# Patient Record
Sex: Male | Born: 1970 | Race: White | Hispanic: No | Marital: Married | Smoking: Former smoker
Health system: Southern US, Community
[De-identification: ages and names within clinical notes are randomized; demographics above are authoritative.]

## PROBLEM LIST (undated history)

## (undated) DIAGNOSIS — N200 Calculus of kidney: Secondary | ICD-10-CM

## (undated) DIAGNOSIS — M792 Neuralgia and neuritis, unspecified: Secondary | ICD-10-CM

## (undated) DIAGNOSIS — Z5189 Encounter for other specified aftercare: Secondary | ICD-10-CM

## (undated) DIAGNOSIS — Z87442 Personal history of urinary calculi: Secondary | ICD-10-CM

## (undated) DIAGNOSIS — I499 Cardiac arrhythmia, unspecified: Secondary | ICD-10-CM

## (undated) DIAGNOSIS — I10 Essential (primary) hypertension: Secondary | ICD-10-CM

## (undated) DIAGNOSIS — F32A Depression, unspecified: Secondary | ICD-10-CM

## (undated) DIAGNOSIS — G709 Myoneural disorder, unspecified: Secondary | ICD-10-CM

## (undated) DIAGNOSIS — M1990 Unspecified osteoarthritis, unspecified site: Secondary | ICD-10-CM

## (undated) DIAGNOSIS — K219 Gastro-esophageal reflux disease without esophagitis: Secondary | ICD-10-CM

## (undated) DIAGNOSIS — F419 Anxiety disorder, unspecified: Secondary | ICD-10-CM

## (undated) DIAGNOSIS — K432 Incisional hernia without obstruction or gangrene: Secondary | ICD-10-CM

## (undated) HISTORY — DX: Encounter for other specified aftercare: Z51.89

## (undated) HISTORY — PX: APPENDECTOMY: SHX54

## (undated) HISTORY — DX: Unspecified osteoarthritis, unspecified site: M19.90

## (undated) HISTORY — DX: Myoneural disorder, unspecified: G70.9

## (undated) HISTORY — PX: BACK SURGERY: SHX140

## (undated) HISTORY — PX: LAPAROSCOPIC GASTROTOMY W/ REPAIR OF ULCER: SUR772

## (undated) HISTORY — PX: TONSILLECTOMY: SUR1361

## (undated) HISTORY — PX: HERNIA REPAIR: SHX51

## (undated) HISTORY — DX: Neuralgia and neuritis, unspecified: M79.2

---

## 1997-09-14 ENCOUNTER — Emergency Department (HOSPITAL_COMMUNITY): Admission: EM | Admit: 1997-09-14 | Discharge: 1997-09-14 | Payer: Self-pay | Admitting: Emergency Medicine

## 1997-09-21 ENCOUNTER — Emergency Department (HOSPITAL_COMMUNITY): Admission: EM | Admit: 1997-09-21 | Discharge: 1997-09-21 | Payer: Self-pay | Admitting: Emergency Medicine

## 2000-09-23 ENCOUNTER — Emergency Department (HOSPITAL_COMMUNITY): Admission: EM | Admit: 2000-09-23 | Discharge: 2000-09-23 | Payer: Self-pay | Admitting: Emergency Medicine

## 2001-01-11 ENCOUNTER — Encounter (INDEPENDENT_AMBULATORY_CARE_PROVIDER_SITE_OTHER): Payer: Self-pay | Admitting: *Deleted

## 2001-01-11 ENCOUNTER — Emergency Department (HOSPITAL_COMMUNITY): Admission: EM | Admit: 2001-01-11 | Discharge: 2001-01-11 | Payer: Self-pay | Admitting: *Deleted

## 2001-01-12 ENCOUNTER — Emergency Department (HOSPITAL_COMMUNITY): Admission: EM | Admit: 2001-01-12 | Discharge: 2001-01-12 | Payer: Self-pay | Admitting: Emergency Medicine

## 2002-11-04 ENCOUNTER — Encounter: Admission: RE | Admit: 2002-11-04 | Discharge: 2002-11-04 | Payer: Self-pay | Admitting: Family Medicine

## 2002-11-04 ENCOUNTER — Encounter: Payer: Self-pay | Admitting: Family Medicine

## 2004-06-14 ENCOUNTER — Encounter: Admission: RE | Admit: 2004-06-14 | Discharge: 2004-06-14 | Payer: Self-pay | Admitting: Family Medicine

## 2004-10-22 ENCOUNTER — Emergency Department (HOSPITAL_COMMUNITY): Admission: EM | Admit: 2004-10-22 | Discharge: 2004-10-22 | Payer: Self-pay | Admitting: Emergency Medicine

## 2005-08-04 ENCOUNTER — Emergency Department (HOSPITAL_COMMUNITY): Admission: EM | Admit: 2005-08-04 | Discharge: 2005-08-04 | Payer: Self-pay | Admitting: Emergency Medicine

## 2005-08-30 ENCOUNTER — Ambulatory Visit (HOSPITAL_COMMUNITY): Admission: RE | Admit: 2005-08-30 | Discharge: 2005-08-30 | Payer: Self-pay | Admitting: Specialist

## 2005-10-19 ENCOUNTER — Encounter: Admission: RE | Admit: 2005-10-19 | Discharge: 2005-10-19 | Payer: Self-pay | Admitting: Specialist

## 2006-02-17 ENCOUNTER — Emergency Department (HOSPITAL_COMMUNITY): Admission: EM | Admit: 2006-02-17 | Discharge: 2006-02-17 | Payer: Self-pay | Admitting: Emergency Medicine

## 2006-09-12 ENCOUNTER — Ambulatory Visit (HOSPITAL_COMMUNITY)
Admission: RE | Admit: 2006-09-12 | Discharge: 2006-09-12 | Payer: Self-pay | Admitting: Physical Medicine and Rehabilitation

## 2006-11-26 ENCOUNTER — Emergency Department (HOSPITAL_COMMUNITY): Admission: EM | Admit: 2006-11-26 | Discharge: 2006-11-26 | Payer: Self-pay | Admitting: Emergency Medicine

## 2006-12-11 ENCOUNTER — Encounter: Admission: RE | Admit: 2006-12-11 | Discharge: 2006-12-11 | Payer: Self-pay | Admitting: Specialist

## 2007-01-05 ENCOUNTER — Encounter: Admission: RE | Admit: 2007-01-05 | Discharge: 2007-01-05 | Payer: Self-pay | Admitting: Family Medicine

## 2007-01-26 ENCOUNTER — Encounter: Admission: RE | Admit: 2007-01-26 | Discharge: 2007-01-26 | Payer: Self-pay | Admitting: Neurosurgery

## 2007-01-30 ENCOUNTER — Ambulatory Visit: Payer: Self-pay | Admitting: Thoracic Surgery

## 2007-02-06 ENCOUNTER — Inpatient Hospital Stay (HOSPITAL_COMMUNITY): Admission: RE | Admit: 2007-02-06 | Discharge: 2007-02-09 | Payer: Self-pay | Admitting: Neurosurgery

## 2007-02-06 ENCOUNTER — Ambulatory Visit: Payer: Self-pay | Admitting: Thoracic Surgery

## 2007-02-06 HISTORY — PX: OTHER SURGICAL HISTORY: SHX169

## 2007-02-21 ENCOUNTER — Ambulatory Visit: Payer: Self-pay | Admitting: Thoracic Surgery

## 2007-02-21 ENCOUNTER — Encounter: Admission: RE | Admit: 2007-02-21 | Discharge: 2007-02-21 | Payer: Self-pay | Admitting: Thoracic Surgery

## 2007-04-25 ENCOUNTER — Encounter: Admission: RE | Admit: 2007-04-25 | Discharge: 2007-04-25 | Payer: Self-pay | Admitting: Thoracic Surgery

## 2007-04-25 ENCOUNTER — Ambulatory Visit: Payer: Self-pay | Admitting: Thoracic Surgery

## 2007-06-17 ENCOUNTER — Encounter: Admission: RE | Admit: 2007-06-17 | Discharge: 2007-06-17 | Payer: Self-pay | Admitting: Neurosurgery

## 2007-08-30 ENCOUNTER — Encounter: Admission: RE | Admit: 2007-08-30 | Discharge: 2007-08-30 | Payer: Self-pay | Admitting: Neurosurgery

## 2007-09-02 ENCOUNTER — Ambulatory Visit (HOSPITAL_COMMUNITY): Admission: RE | Admit: 2007-09-02 | Discharge: 2007-09-02 | Payer: Self-pay | Admitting: *Deleted

## 2008-02-01 ENCOUNTER — Emergency Department (HOSPITAL_COMMUNITY): Admission: EM | Admit: 2008-02-01 | Discharge: 2008-02-01 | Payer: Self-pay | Admitting: Emergency Medicine

## 2008-02-01 ENCOUNTER — Ambulatory Visit: Payer: Self-pay | Admitting: Psychiatry

## 2008-02-01 ENCOUNTER — Inpatient Hospital Stay (HOSPITAL_COMMUNITY): Admission: RE | Admit: 2008-02-01 | Discharge: 2008-02-04 | Payer: Self-pay | Admitting: Psychiatry

## 2008-08-05 ENCOUNTER — Ambulatory Visit: Payer: Self-pay | Admitting: Neurosurgery

## 2009-03-30 ENCOUNTER — Emergency Department (HOSPITAL_COMMUNITY): Admission: EM | Admit: 2009-03-30 | Discharge: 2009-03-30 | Payer: Self-pay | Admitting: Emergency Medicine

## 2009-04-30 ENCOUNTER — Ambulatory Visit: Payer: Self-pay | Admitting: Family Medicine

## 2009-04-30 DIAGNOSIS — N508 Other specified disorders of male genital organs: Secondary | ICD-10-CM

## 2009-05-08 ENCOUNTER — Telehealth: Payer: Self-pay | Admitting: Family Medicine

## 2009-05-13 ENCOUNTER — Encounter: Payer: Self-pay | Admitting: Family Medicine

## 2009-05-29 ENCOUNTER — Ambulatory Visit (HOSPITAL_COMMUNITY): Admission: RE | Admit: 2009-05-29 | Discharge: 2009-05-29 | Payer: Self-pay | Admitting: Urology

## 2009-05-29 ENCOUNTER — Emergency Department (HOSPITAL_COMMUNITY): Admission: EM | Admit: 2009-05-29 | Discharge: 2009-05-29 | Payer: Self-pay | Admitting: Emergency Medicine

## 2009-07-15 ENCOUNTER — Telehealth: Payer: Self-pay | Admitting: Family Medicine

## 2009-07-20 ENCOUNTER — Telehealth: Payer: Self-pay | Admitting: Family Medicine

## 2009-07-30 ENCOUNTER — Telehealth: Payer: Self-pay | Admitting: Family Medicine

## 2009-07-31 ENCOUNTER — Telehealth: Payer: Self-pay | Admitting: Family Medicine

## 2009-08-06 DIAGNOSIS — M545 Low back pain, unspecified: Secondary | ICD-10-CM

## 2009-08-06 HISTORY — DX: Low back pain, unspecified: M54.50

## 2009-09-14 ENCOUNTER — Ambulatory Visit (HOSPITAL_COMMUNITY): Admission: RE | Admit: 2009-09-14 | Discharge: 2009-09-14 | Payer: Self-pay

## 2010-05-06 ENCOUNTER — Ambulatory Visit: Payer: Self-pay | Admitting: Family Medicine

## 2010-05-06 ENCOUNTER — Encounter: Payer: Self-pay | Admitting: Family Medicine

## 2010-05-06 ENCOUNTER — Emergency Department (HOSPITAL_COMMUNITY)
Admission: EM | Admit: 2010-05-06 | Discharge: 2010-05-06 | Payer: Self-pay | Source: Home / Self Care | Admitting: Emergency Medicine

## 2010-05-06 DIAGNOSIS — R079 Chest pain, unspecified: Secondary | ICD-10-CM

## 2010-05-06 HISTORY — DX: Chest pain, unspecified: R07.9

## 2010-05-13 ENCOUNTER — Ambulatory Visit: Payer: Self-pay | Admitting: Internal Medicine

## 2010-05-13 DIAGNOSIS — R05 Cough: Secondary | ICD-10-CM

## 2010-06-12 ENCOUNTER — Encounter: Payer: Self-pay | Admitting: Family Medicine

## 2010-06-14 ENCOUNTER — Encounter: Payer: Self-pay | Admitting: Orthopaedic Surgery

## 2010-06-22 NOTE — Progress Notes (Signed)
Summary: referral  Phone Note Call from Patient   Summary of Call: patient is calling because he does not want to go back to dr Dutch Quint because he "messed up " his back.  any suggestions.  there was a dr's name suggested from [facility] at the last  office visit.  he does not remember his name. Initial call taken by: Kern Reap CMA Duncan Dull),  July 31, 2009 12:58 PM  Follow-up for Phone Call        the best way to proceed is to have [name], refer him to one of the neurosurgeons at [facility] Follow-up by: Roderick Pee MD,  July 31, 2009 2:05 PM  Additional Follow-up for Phone Call Additional follow up Details #1::        tried to call patient home number not correct Additional Follow-up by: Kern Reap CMA Duncan Dull),  August 03, 2009 2:20 PM     Appended Document: Orders Update    Clinical Lists Changes  Problems: Added new problem of BACK PAIN, LUMBAR (ICD-724.2) Orders: Added new Referral order of Neurosurgeon Referral (Neurosurgeon) - Signed

## 2010-06-22 NOTE — Progress Notes (Signed)
Summary: ref  Phone Note Call from Patient   Caller: Patient Call For: Roderick Pee MD Summary of Call: he's been to urologist and now needs to pusue back issues- Kendell Bane? needs referral cell- [phone]. Initial call taken by: Raechel Ache, RN,  July 15, 2009 4:09 PM  Follow-up for Phone Call        Fleet Contras please call to clarify what the issue is >????????? Follow-up by: Roderick Pee MD,  July 20, 2009 8:14 AM  Additional Follow-up for Phone Call Additional follow up Details #1::        left message on machine for patient to return our call Additional Follow-up by: Kern Reap CMA Duncan Dull),  July 20, 2009 10:53 AM

## 2010-06-22 NOTE — Progress Notes (Signed)
Summary: return call  Phone Note Call from Patient   Caller: Patient Call For: rachel Summary of Call: returned your call same phone # Initial call taken by: Raechel Ache, RN,  July 20, 2009 2:41 PM  Follow-up for Phone Call        left message on machine for patient to return call Follow-up by: Kern Reap CMA Duncan Dull),  July 22, 2009 1:04 PM  Additional Follow-up for Phone Call Additional follow up Details #1::        uable to reach patient left message on machine  Additional Follow-up by: Kern Reap CMA Duncan Dull),  July 23, 2009 9:49 AM

## 2010-06-22 NOTE — Progress Notes (Signed)
Summary: referral  Phone Note Call from Patient   Caller: Patient Call For: Kevin Pee MD Summary of Call: calling about referral to [facility] for his back. This was discussed at his visit- he has chronic back pain since his surgery which shoots through to his right ribs.  Please refer Initial call taken by: Raechel Ache, RN,  July 30, 2009 3:30 PM  Follow-up for Phone Call        this would be done through [name] his neurosurgeon.......... have him call [name] Follow-up by: Kevin Pee MD,  July 30, 2009 4:02 PM  Additional Follow-up for Phone Call Additional follow up Details #1::        patient's father is aware Additional Follow-up by: Kern Reap CMA Duncan Dull),  July 31, 2009 12:44 PM

## 2010-06-24 NOTE — Assessment & Plan Note (Signed)
Summary: left side pain and dizziness//slm   Vital Signs:  Patient profile:   40 year old male Height:      71 inches Weight:      181 pounds BMI:     25.34 Temp:     98.6 degrees F oral Pulse rate:   97 / minute BP sitting:   142 / 92  (left arm) Cuff size:   regular  Vitals Entered By: Kathlene November LPN (May 06, 2010 4:36 PM) CC: last night woke up at 3am with sweats, this morning had dizziness and pain in center of chest that radiates into right side and back- worse when takes a deep breath   CC:  last night woke up at 3am with sweats and this morning had dizziness and pain in center of chest that radiates into right side and back- worse when takes a deep breath.  History of Present Illness: Kevin Jackson is a 40 year old male, who comes in today for evaluation of two problems.  He states he woke up around two o'clock in the morning, was dizzy, and sweating.  It lasted for a couple hours and went away.  He also noticed some discomfort in the right side of his chest.  This se gone away.  He has a fever, earache, sore throat, cough, etc..  It also for the past 3 or 4, months.  He's had episodes that now are becoming more frequent of discomfort in his upper chest where he feels like he can't breathe.  He had surgery on his back by [name] for thoracic bone problem....... question bone spur........ and he went back to see [name] with the above concerns.  [name] told him it is not related to the surgery  Current Medications (verified): 1)  None  Allergies (verified): No Known Drug Allergies  Comments:  Nurse/Medical Assistant: The patient's medications and allergies were reviewed with the patient and were updated in the Medication and Allergy Lists. Kathlene November LPN (May 06, 2010 4:38 PM)  Past History:  Past medical, surgical, family and social histories (including risk factors) reviewed, and no changes noted (except as noted below).  Past Medical  History: Reviewed history from 04/30/2009 and no changes required. back surgery nerve damage with ribs  Past Surgical History: Reviewed history from 04/30/2009 and no changes required. Appendectomy Tonsillectomy back surgery  Family History: Reviewed history from 04/30/2009 and no changes required. Father: unknown Mother: HTN Siblings: 2 brothers - 1 tumours               1 sister - healthy Children: 1 boy - healthy                1 girl scoliosis  Social History: Reviewed history from 04/30/2009 and no changes required. Divorced Never Smoked Drug use-no  Review of Systems      See HPI  Physical Exam  General:  Well-developed,well-nourished,in no acute distress; alert,appropriate and cooperative throughout examination Head:  Normocephalic and atraumatic without obvious abnormalities. No apparent alopecia or balding. Eyes:  No corneal or conjunctival inflammation noted. EOMI. Perrla. Funduscopic exam benign, without hemorrhages, exudates or papilledema. Vision grossly normal. Ears:  External ear exam shows no significant lesions or deformities.  Otoscopic examination reveals clear canals, tympanic membranes are intact bilaterally without bulging, retraction, inflammation or discharge. Hearing is grossly normal bilaterally. Nose:  External nasal examination shows no deformity or inflammation. Nasal mucosa are pink and moist without lesions or exudates. Mouth:  Oral mucosa and oropharynx  without lesions or exudates.  Teeth in good repair. Neck:  No deformities, masses, or tenderness noted. Chest Wall:  No deformities, masses, tenderness or gynecomastia noted. Breasts:  No masses or gynecomastia noted Lungs:  Normal respiratory effort, chest expands symmetrically. Lungs are clear to auscultation, no crackles or wheezes. Heart:  Normal rate and regular rhythm. S1 and S2 normal without gallop, murmur, click, rub or other extra sounds. Skin:  scar right posterior chest wall from  previous thoracic surgery   Problems:  Medical Problems Added: 1)  Dx of Chest Pain  (ICD-786.50)  Impression & Recommendations:  Problem # 1:  CHEST PAIN (ICD-786.50) Assessment New  Orders: Pulmonary Referral (Pulmonary)  Patient Instructions: 1)  take Motrin, 600 mg 3 times a day with food. 2)  I will schedule a consult and pulmonary ASAP to begin an evaluation of your problems   Orders Added: 1)  Pulmonary Referral [Pulmonary] 2)  Est. Patient Level IV [[address]]

## 2010-06-24 NOTE — Assessment & Plan Note (Signed)
Summary: Pulmonary consultation   Visit Type:  Initial Consult Copy to:  Dr. Kelle Darting Primary Provider/Referring Provider:  Dr. Kelle Darting  CC:  Dyspnea and CP.  History of Present Illness: 40 yowm minimal smoking quit 1999 referred by Dr Tawanna Cooler for chest pain  May 13, 2010  1st pulmonary office eval for cp dates back to surgery on T -7 by Dr Dutch Quint always with deep breath or if pushes under egde  of R costal margin in ax line assoc with sensation on numbness along same basic dermatome to the midline,  also dry  coughing ever since surgery, steroids seemed to improve the cough but not the pain.  Pt denies any significant sore throat, dysphagia, itching, sneezing,  nasal congestion or excess secretions,  fever, chills, sweats, unintended wt loss, pleuritic or exertional cp, hempoptysis, change in activity tolerance  orthopnea pnd or leg swelling.  Pt also denies any obvious fluctuation in symptoms with weather or environmental change or other alleviating or aggravating factors.       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  Quit in 1999.  Smoked 1/2 ppd x 2 yrs Drug use-no ETOH once per month Uses smokeless tobacco Lives with Parents  Review of Systems       The patient complains of shortness of breath with activity, non-productive cough, chest pain, irregular heartbeats, and abdominal pain.  The patient denies shortness of  breath at rest, productive cough, coughing up blood, acid heartburn, indigestion, loss of appetite, weight change, difficulty swallowing, sore throat, tooth/dental problems, headaches, nasal congestion/difficulty breathing through nose, sneezing, itching, ear ache, anxiety, depression, hand/feet swelling, joint stiffness or pain, rash, change in color of mucus, and fever.    Vital Signs:  Patient profile:   40 year old male Height:      70 inches Weight:      183 pounds O2 Sat:      97 % on Room air Temp:     98.2 degrees F oral Pulse rate:   93 / minute BP sitting:   112 / 86  (left arm)  Vitals Entered By: Vernie Murders (May 13, 2010 8:47 AM)  O2 Flow:  Room air  Physical Exam  Additional Exam:  wt 181 > 183 May 13, 2010 Ambulatory healthy appearing anxious wm nad HEENT: nl dentition, turbinates, and orophanx. Nl external ear canals without cough reflex Neck without JVD/Nodes/TM Lungs clear to A and P bilaterally without cough on insp or exp maneuvers RRR no s3 or murmur or increase in P2 Abd soft and benign with nl excursion in the supine position. 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  try rx with zostix, consider tens as last resort.  Orders: Consultation Level III ([address])  Problem # 2:  COUGH (ICD-786.2)  The most common causes of chronic cough in immunocompetent adults include: upper airway cough syndrome (UACS), previously referred to as postnasal drip syndrome,  caused by variety of rhinosinus conditions; (2) asthma; (3) GERD; (4) chronic bronchitis from cigarette smoking or other inhaled environmental irritants; (5) nonasthmatic eosinophilic bronchitis; and (6) bronchiectasis. These conditions, singly or in combination, have accounted  for up to 94% of the causes of chronic cough in prospective studies.    Of the three most common causes of chronic cough, only one(GERD)  can actually cause the other two(asthma/pnds)  and perpetuate the cylce of cough inducing airway trauma, inflammation, heightened sensitivity to reflux which is prompted by the cough itself via a cyclical mechanism.  This may partially respond to steroids and look like asthma and post nasal drainage but never erradicated completely unless the cough and the secondary reflux are eliminated, preferably both at the same time.   Orders: Consultation Level III ([address])  Medications Added to Medication List This Visit: 1)  Tramadol Hcl 50 Mg Tabs (Tramadol hcl) .... One to two by mouth every 4-6 hours if coughing at all 2)  Prednisone 10 Mg Tabs (Prednisone) .... 4 each am x 2days, 2x2days, 1x2days and stop 3)  Prilosec Otc 20 Mg Tbec (Omeprazole magnesium) .... Take  one 30-60 min before first meal of the day 4)  Pepcid 20 Mg Tabs (Famotidine) .... Take one by mouth at bedtime  Patient Instructions: 1)  Zostrix cream 4 x  a day for 4 weeks 2)  Tramadol 50 1-2 every 4 hours until no longer coughing at all 3)  Prednisone 10 mg 4 each am x 2days, 2x2days, 1x2days and stop 4)  Prilosec before bfast and pepcid 20 mg at bedtime as long as coughing ( reflux is to cough what oxygen is to fire)  5)  GERD (REFLUX)  is a common cause of respiratory symptoms. It commonly presents without heartburn and can be treated with medication, but also with lifestyle changes including avoidance of late meals, excessive alcohol, smoking cessation, and avoid fatty foods, chocolate, peppermint, colas, red wine, and acidic juices such as orange juice. NO MINT OR MENTHOL PRODUCTS SO NO COUGH DROPS  6)  USE SUGARLESS CANDY INSTEAD (jolley ranchers)  7)  NO OIL BASED VITAMINS  8)  Call in 2 weeks if not satisfied  Prescriptions: PREDNISONE 10 MG  TABS (PREDNISONE) 4 each am x 2days, 2x2days,  1x2days and stop  #14 x 0   Entered and Authorized by:   Nyoka Cowden MD   Signed by:   Nyoka Cowden MD on 05/13/2010   Method used:   Print then Give to Patient   RxID:   [id number] TRAMADOL HCL 50 MG  TABS (TRAMADOL HCL) One to two by mouth every 4-6 hours if coughing at all  #40 x 0   Entered and Authorized by:   Nyoka Cowden MD   Signed by:   Nyoka Cowden MD on 05/13/2010   Method used:   Print then Give to Patient   RxID:   [id number]

## 2010-07-07 ENCOUNTER — Emergency Department (HOSPITAL_COMMUNITY)
Admission: EM | Admit: 2010-07-07 | Discharge: 2010-07-07 | Disposition: A | Discharge status: Home / Self Care | Payer: Self-pay | Attending: Emergency Medicine | Admitting: Emergency Medicine

## 2010-07-07 ENCOUNTER — Emergency Department (HOSPITAL_COMMUNITY): Payer: Self-pay

## 2010-07-07 DIAGNOSIS — R079 Chest pain, unspecified: Secondary | ICD-10-CM | POA: Insufficient documentation

## 2010-07-07 DIAGNOSIS — R002 Palpitations: Secondary | ICD-10-CM | POA: Insufficient documentation

## 2010-07-07 LAB — POCT I-STAT, CHEM 8
Calcium, Ion: 1.1 mmol/L — ABNORMAL LOW (ref 1.12–1.32)
Glucose, Bld: 104 mg/dL — ABNORMAL HIGH (ref 70–99)
HCT: 44 % (ref 39.0–52.0)
Hemoglobin: 15 g/dL (ref 13.0–17.0)
TCO2: 25 mmol/L (ref 0–100)

## 2010-07-07 LAB — POCT CARDIAC MARKERS: Troponin i, poc: <0.05 ng/mL (ref 0.00–0.09)

## 2010-07-21 ENCOUNTER — Encounter: Payer: Self-pay | Admitting: Cardiovascular Disease
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
## 2010-10-05 NOTE — Letter (Signed)
April 25, 2007   Henry A. Pool, M.D.  [address] [place], [place] [address]   Re:  Kevin Jackson, Kevin Jackson              DOB:  February 17, 1971   Dear Mardelle Matte,   I saw Kevin Jackson back in the office today.  He is doing well  overall.  From the standpoint of his chest x-ray, he is stable.  He is  still having a moderate amount of pain, particularly in the right upper  quadrant.  I told him this should gradually improve and for him to  gradually increase his activities.   His blood pressure was 125/80, pulse 87, respirations 18, sats 98%.   He will see you back again on December 30th, and I will be happy to see  him again if he has any future problems.   Ines Bloomer, M.D.  Electronically Signed   DPB/MEDQ  D:  04/25/2007  T:  04/25/2007  Job:  [id number]

## 2010-10-05 NOTE — Op Note (Signed)
NAME:  Kevin Jackson, Kevin Jackson      ACCOUNT NO.:  [id number]   MEDICAL RECORD NO.:  [id number]          PATIENT TYPE:  INP   LOCATION:  3311                         FACILITY:  [facility]   PHYSICIAN:  Kathaleen Maser. Pool, M.D.    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   PROCEDURE:  Right transthoracic T7-8 microdiskectomy, microdissection.   SURGEON:  Kathaleen Maser. Pool, M.D.   ASSISTANT:  Reinaldo Meeker, M.D.   THORACIC SURGEON:  Ines Bloomer, M.D.   ANESTHESIA:  General endotracheal.   INDICATIONS FOR PROCEDURE:  Mr. Belloso is a 40 year old male with  history of the mid-thoracic pain with radiation to both lower  extremities.  Workup has demonstrated evidence of a focal disk  herniation at T7-8 with significant compression of the ventral aspect of  the spinal cord.  We discussed options of management including both  operative and nonoperative care.  We discussed in detail the possibility  of undergoing a right-sided T7-8 transthoracic microdiskectomy in hopes  of improving his symptoms.  The patient is aware of the risks and  benefits and wishes to proceed.   DESCRIPTION OF PROCEDURE:  The patient was taken to the operating room  and placed on the operating table in the supine position.  After an  adequate level  of anesthesia achieved, the patient positioned in the  left lateral decubitus position with all pressure points padded and  axillary roll placed.  The patient's right chest and abdomen was prepped  and draped sterilely.   Dr. Edwyna Shell performed a thoracotomy through what was felt to be the sixth  interspace.  This was carried down into the pleural cavity.  The lung  was deflated and packed away.  After Dr. Edwyna Shell  had provided exposure,  an x-ray was taken and approach was actually made to the T8-9  interspace.  Dissection was redirected one level cephalad.  I then  removed the rib head of the eighth rib at its articulation with the  lateral aspect of the vertebral body of T8 and the aspect of the disk  space.  A diskectomy was then performed involving the posterior half of  the disk space.  Microscope was brought into the field.  Using the high-  speed drill, aspects of the posterior bodies of T7-T8 were removed.  Dissection then proceeded down to the level of the posterior  longitudinal ligament.  The posterior longitudinal ligament was then  elevated and resected in piecemeal fashion using Kerrison rongeurs.  The  __________ of the thecal sac was then identified.  Decompression of the  central spinal canal was then performed using Kerrison rongeurs to  undercut the bodies at T7-T8.  Decompression then proceeded from right  total left, crossing past midline.  A focal fragmented disk herniation  was encountered and completely resected, which was obviously compressing  the spinal cord.  After very thorough decompression was achieved, there  was no evidence of any continued compression.  The spinal canal was  inspected.  There was no evidence of any residual compression.  There  was no evidence of injury to the thecal sac or nerve roots or spinal  cord.  The wound was then irrigated with  antibiotic solution.  Gelfoam was placed topically in the disk space for  hemostasis which was found to be good.  Dr. Edwyna Shell returned and closed  the thoracotomy and placed chest tubes in typical fashion.  There no  operative complications.  The patient tolerated the procedure well, and  he returned to the recovery room postoperatively.           ______________________________  Kathaleen Maser Pool, M.D.     HAP/MEDQ  D:  02/06/2007  T:  02/06/2007  Job:  [id number]

## 2010-10-05 NOTE — Op Note (Signed)
[name], Kevin Jackson      ACCOUNT NO.:  [id number]   MEDICAL RECORD NO.:  [id number]          PATIENT TYPE:  INP   LOCATION:  3172                         FACILITY:  [facility]   PHYSICIAN:  Ines Bloomer, M.D. DATE OF BIRTH:  Jul 26, 1970   DATE OF PROCEDURE:  DATE OF DISCHARGE:                               OPERATIVE REPORT   PREOPERATIVE DIAGNOSIS:  Herniated C7-T8 disk.   POSTOPERATIVE DIAGNOSIS:  Herniated C7-T8 disk.   OPERATION:  Right thoracotomy for exposure for the C7-T8 diskectomy.   SURGEON:  Ines Bloomer, M.D.   ASSISTANT:  Stephanie Acre Dominick, P.A.-C.   DESCRIPTION OF PROCEDURE:  After percutaneous insertion of all  monitoring lines,  The right lung was deflated and a posterolateral thoracotomy incision  was made over the seventh intercostal space and dissection was carried  down through the subcutaneous tissues partially dividing the latissimus  and splitting the serratus.  Seventh intercostal space was then entered  and dissection was carried down to the ninth rib.  This was then marked  and with an x-ray and apparently we were actually in the eighth  intercostal space so we dissected up superiorly into the seventh  intercostal space.  A TPA  was placed in the lung and then the inferior  pulmonary ligament had been taken down.  The pleura was opened over the  head of the eighth rib and Dr. Lelon Perla, in a separate dictation, did  a diskectomy.  After this had been done, 2 chest tubes were placed  through separate stab wounds, a straight 28 and a right angled 28.  A  Marcaine block was done in the usual fashion.  A single On-Q was  inserted in the usual fashion.  The chest was closed with 3 pericostals,  #1 Vicryl in the muscle layer, 2-0 Vicryl in subcutaneous tissues,  Dermabond over the skin.  The patient was returned to the recovery room  in stable condition.      Ines Bloomer, M.D.  Electronically Signed     DPB/MEDQ  D:  02/06/2007   T:  02/06/2007  Job:  [id number]   cc:   Ines Bloomer, M.D.

## 2010-10-05 NOTE — Letter (Signed)
February 21, 2007   Henry A. Pool, MD  [address]  [place], [place]  [address]   Re:  [name], [name]              DOB:  08/28/1970   Dear Mardelle Matte,   I saw Kevin Jackson in the office today and he is doing well after his  surgery.  His incision is well healed and his chest x-ray shows normal  postoperative changes.  He is still having some moderate post-  thoracotomy pain but overall looks better than I would have expected.  His lungs were clear to auscultation and percussion.  His blood pressure  was 124/79, pulse 82, respirations 18.  Sats were 95%.   I will see him back again in 2 months with a chest x-ray.   Ines Bloomer, M.D.  Electronically Signed   DPB/MEDQ  D:  02/21/2007  T:  02/22/2007  Job:  [id number]

## 2010-10-05 NOTE — Letter (Signed)
January 30, 2007   Sherilyn Cooter A. Pool, M.D.  [address]  [place], [place] [address]   Re:  Kevin Jackson, Kevin Jackson              DOB:  [birth year]   Dear Dr. Jordan Likes:   I appreciate the opportunity to see this patient.  This 40 year old  patient has had a history of severe pain in his right chest and  underwent a myelogram which showed a T7, a central disk perfusion with  no compression.  He continues to have severe pain as well as pain in his  right leg and was seen by Dr. Otelia Sergeant and then Dr. Jordan Likes and is referred  here for a diskectomy which is scheduled on 02/06/2007.   His medications include:  Cymbalta, Norco, Lyrica and Celebrex.  He has  no allergies.   PAST MEDICAL HISTORY:  He has had a tonsillectomy and appendectomy.   FAMILY HISTORY:  Noncontributory.   SOCIAL HISTORY:  He is married, he has two children.  He works as a Furniture conservator/restorer.  He quit smoking two years ago.  He does not drink alcohol  on a regular basis.   REVIEW OF SYSTEMS:  VITAL SIGNS: His weight is 165 pounds, he is 5 feet  11 inches.  He has had some weight loss.  CARDIAC: No angina or atrial fibrillation.  PULMONARY:  No bronchitis, hemoptysis, wheezing.  GI:  No nausea, vomiting, constipation or diarrhea.  No dysuria or  frequent urination.  VASCULAR:  He has pain in his leg with walking.  No TIA's or stroke.  NEUROLOGIC:  No dizziness, blackouts or seizures.  MUSCULOSKELETAL: See history of present illness.  PSYCHIATRIC:  No psychiatric.  HEENT: No changes in eye sight or hearing.  HEME:  No problems with bleeding or clotting disorders.   PHYSICAL EXAMINATION:  General:  He is a well developed Caucasian male  in no acute distress.  Vital Signs:  Blood pressure 136/85, pulse 18,  pulse 84, respirations 18, saturations 98%.  Head, eyes, ears, nose and  throat are unremarkable.  Neck is supple without thyromegaly.  There is  no supraclavicular or axillary adenopathy.  Chest is clear to  auscultation and percussion.  Heart is regular sinus rhythm.  No murmur.  Abdomen is soft, no hepatosplenomegaly.  Pulses 2+, there is no clubbing  or edema.   I will be happy to assist on a T7, 8 diskectomy on Kevin Jackson and we  will plan to use a right thoracotomy.  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   I appreciate the opportunity to see him.   Sincerely,   Ines Bloomer, M.D.  Electronically Signed   DPB/MEDQ  D:  01/30/2007  T:  01/31/2007  Job:  [id number]

## 2010-10-08 NOTE — H&P (Signed)
[place]  Patient:    Kevin Jackson, BULKLEY Visit Number: [id number] MRN: [id number]          Service Type: EMS Location: [facility] Attending Physician:  Corlis Leak. Dictated by:   Genene Churn. Sherin Quarry, M.D. Adm. Date:  01/11/2001 Disc. Date: 01/11/2001   CC:         Verlin Grills, M.D.  Desma Maxim, M.D.  [facility]   History and Physical  EMERGENCY ROOM VISIT  HISTORY OF PRESENT ILLNESS:  Mr. Kraemer is a 40 year old white male who vomited blood the night before last and spent yesterday in the emergency room where evaluations included a normal CBC and a CMET.  He was endoscoped by Dr. Laural Benes and aphthous ulceration was found in the stomach.  He was started on Protonix and the stomach biopsies were obtained, although they are still pending.  He apparently was quite sedated yesterday, went home, did not eat supper, spent the night feeling okay, and awakened this morning feeling relatively well, although still having some burning in his stomach.  He then drove to his wifes place of work to retrieve his wallet, and on the way into her office felt quite dizzy, light-headed, but did not pass out and did not throw up.  He actually did not feel nauseated with that.  His wife called me and we agreed to see him in the emergency room to be sure that he was not bleeding.  Ever since the night before last he had had no nausea, no vomiting, and no bowel movements.  PAST HISTORY:  Patient does have a past history of alcohol ingestion but stated that he was not drinking very heavily or regularly prior to this onset of illness and had not had any alcohol since he was discharged from the emergency room yesterday.  He does not smoke and he does not take nonsteroidal drugs.  MEDICATIONS:  Except for the Protonix started yesterday, he is not on any medications.  ALLERGIES:  He is not allergic to any  medicine.  PHYSICAL EXAMINATION:  VITAL SIGNS:  blood pressure 130/80, pulse 75, and there was no tilt.  HEENT:  Negative.  NECK:  Supple without nodes, bruits, or thyromegaly.  CHEST:  Clear.  Heart tones normal without extra sounds or murmurs.  ABDOMEN:  Scaphoid and nontender without organomegaly or masses palpable.  LABORATORY DATA:  CBC reveals a hemoglobin of 15.9 - up from 14.4 yesterday, WBC 5.5 which was unchanged.  I did not repeat his CMET, which had been normal from yesterday.  I also did not repeat his lipase, which was normal.  IMPRESSION:  I believe that he has, probably, either alcoholic or viral gastroenteritis with no evidence currently of active GI bleeding.  RECOMMENDATION:  I told him that he needs to go home, not to drive today, not to go to work, and that he is to eat small amounts more often and wait for the Protonix kick in.  I asked him to make an appointment to see Dr. Laural Benes early next week and encouraged him to see Dr. Arvilla Market should he have any other additional problems. Dictated by:   Genene Churn. Sherin Quarry, M.D. Attending Physician:  Corlis Leak DD:  01/12/01 TD:  01/13/01 Job: [id number]

## 2010-10-08 NOTE — Discharge Summary (Signed)
NAME:  Kevin Jackson, Kevin Jackson      ACCOUNT NO.:  [id number]   MEDICAL RECORD NO.:  [id number]          PATIENT TYPE:  INP   LOCATION:  5151                         FACILITY:  [facility]   PHYSICIAN:  Kathaleen Maser. Pool, M.D.    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   OPERATION THE TREATMENTS.:  Right T7-T8 transthoracic microdiskectomy.   HISTORY OF PRESENT ILLNESS:  Kevin Jackson is a 40 year old male with  history of intractable back and bilateral lower extremity pain, failing  all conservative measures. Workup demonstrates evidence of a moderately  large central disk herniation at T7-8 with marked compression of the  spinal cord.  The patient was counseled as to his options.  He decided  to proceed with a right-sided transthoracic microdiskectomy to help  improve symptoms.   HOSPITAL COURSE:  The patient was taken to the operating room where  uncomplicated right-sided thoracotomy and transthoracic microdiskectomy  was performed.  Postoperatively, the patient did well.  He had the  expected amount of postoperative discomfort from his thoracotomy.  He  was mobilized rapidly.  His back and lower extremity symptoms were  completely resolved.  Neurological exam was intact.  The patient was  rapidly mobilize.  Chest tubes were discontinued on his first and second  postoperative days.  Pain control was obtained with oral medications.  The patient was tolerating a regular diet and felt good enough to be  discharged home.   CONDITION ON DISCHARGE:  Improved.   DISCHARGE DISPOSITION:  The patient will follow up with both me and Dr.  Edwyna Shell in 1 week.           ______________________________  Kathaleen Maser. Pool, M.D.     HAP/MEDQ  D:  03/06/2007  T:  03/06/2007  Job:  [id number]

## 2010-10-08 NOTE — Discharge Summary (Signed)
NAME:  Kevin Jackson, Kevin Jackson      ACCOUNT NO.:  [id number]   MEDICAL RECORD NO.:  [id number]          PATIENT TYPE:  IPS   LOCATION:  0402                          FACILITY:  [facility]   PHYSICIAN:  Anselm Jungling, MD  DATE OF BIRTH:  28-May-1970   DATE OF ADMISSION:  02/01/2008  DATE OF DISCHARGE:  02/04/2008                               DISCHARGE SUMMARY   IDENTIFYING DATA AND REASON FOR ADMISSION:  The patient is a 40 year old  married male who had recently lost his job.  He presented requesting  help for depression, after making a suicide gesture by overdose.  Also,  he had a history of back surgery, and in the course of his subsequent  pain management, he felt he had become addicted to oxycodone.  He wanted  help with this as well.  Please refer to the admission note for further  details pertaining to the symptoms, circumstances and history that led  to his hospitalization.  He was given an initial Axis I diagnosis of  major depressive episode, opiate dependence, and substance-induced mood  disorder.   MEDICAL AND LABORATORY:  The patient was medically and physically  assessed by the psychiatric nurse practitioner.  He was generally in  good health.  He had had thoracic spine surgery, and still experienced  significant pain from this.  This was addressed with Ultram 50 mg up to  3 times daily as needed for pain, and Lidoderm 5% dermal patch.  The  patient was started on a clonidine protocol for opiate withdrawal.   He presented as a well-nourished, normally-developed male who was fairly  open and a good historian.  There were no signs or symptoms of psychosis  or thought disorder.  He denied any further suicidal ideation and  regretted his previous overdose gesture.  He verbalized a strong desire  for help.  He gave [place] consent to contact his wife for her involvement in  his treatment.   On the second hospital day, he was pleasant, relaxed, and participating  in the milieu.  He  remained committed to staying off opiate analgesics.  He indicated that he had been in contact with his wife and that she was  glad that he was getting help from [place].  Later that day, the wife came in  for a family session.  She indicated that she was extremely worried  about her husband, not only for the reasons described above, but because  he had been threatening and aggressive at home.  She indicated that her  preference was that he not come home for the time-being.  She felt some  concern for the safety of herself and the children.   The patient accepted this relatively well.  He indicated that he would  honor his wife's wishes, and accordingly, go to stay with his parents  for the time-being.   The patient, shortly thereafter requested discharge, even though he had  not completed the clonidine protocol.  We recommended that he stay  another 1-2 days to complete the protocol, and to allow [place] to make  appropriate referrals to chemical dependency treatment aftercare, but he  indicated that  he was in a hurry to go.  He felt that he needed to get  back to the business of trying to find a new job.  He indicated that he  felt strong resolve to not return to opiate analgesic use.   AFTERCARE:  The patient was referred to [facility] of the [place],  date and time to be determined at the time of this dictation.  The  patient was discharged with a regimen of clonidine 0.1 mg t.i.d. the day  following discharge, with a taper over the next 4 days.  He was also  prescribed Ultram 50 mg up to b.i.d. as needed for pain, and Lidoderm 5%  patch daily.   DISCHARGE DIAGNOSES:  AXIS I:  1. Opiate dependence, early remission.  2. Substance-induced mood disorder.  3. Marital problem.  AXIS II:  Deferred.  AXIS III:  Back pain.  AXIS IV:  Stressors severe.  AXIS V: GAF on discharge 55.      Anselm Jungling, MD  Electronically Signed     SPB/MEDQ  D:  02/07/2008  T:  02/10/2008  Job:   [id number]

## 2010-10-08 NOTE — Op Note (Signed)
[place]  Patient:    Kevin Jackson, Kevin Jackson Visit Number: [id number] MRN: [id number]          Service Type: Attending:  Verlin Grills, M.D. Proc. Date: 01/11/01   CC:         Desma Maxim, M.D.   Operative Report  DATE OF BIRTH:  [birth year]  PROCEDURE PERFORMED:  Esophagogastroduodenoscopy.  ENDOSCOPIST:  Verlin Grills, M.D.  REFERRING PHYSICIAN:  Desma Maxim, M.D.  INDICATIONS FOR PROCEDURE:  The patient is a 40 year old male who presented to the [facility] Emergency Department with a 24-hour history of nausea, vomiting and small volume hematemesis.  PREMEDICATION:  Versed 10 mg, fentanyl 50 mcg.  INSTRUMENT USED:  Olympus gastroscope.  DESCRIPTION OF PROCEDURE:  After obtaining informed consent, Kevin Jackson was placed in the left lateral decubitus position.  I administered intravenous Versed and intravenous fentanyl to achieve conscious sedation for the procedure.  The patients blood pressure, oxygen saturations and cardiac rhythm were monitored throughout the procedure and documented in the medical record.  The Olympus gastroscope was passed through the hypopharynx into the proximal esophagus without difficulty.  The hypopharynx, larynx and vocal cords appeared normal.  Esophagoscopy:  The proximal, mid and lower segments of the esophagus appeared normal.  Gastroscopy:  Retroflex view of the gastric cardia and fundus followed by examination of the gastric body and antrum revealed generalized aphthous type ulcers involving the entire gastric mucosa.  The pylorus appeared normal. These lesions were biopsied for pathological evaluation.  Duodenoscopy:  There are a few small aphthous type ulcers in the duodenal bulb but the descending duodenum appeared normal.  ASSESSMENT:  Generalized erosive gastritis, characterized by scattered aphthous type ulcers.  Biopsies are  pending.  Differential diagnosis would include Crohns disease or viral lesions.  This does not appear typical of peptic ulcer disease.  The patient tells me he takes no nonsteroidal anti-inflammatory drugs or other medications on a chronic basis.  PLAN:  Kevin Jackson can be discharged from the emergency room.  I will place him on a proton pump inhibitor (Protonix).  I will see him back in my office to go over these biopsies.  If there is any indication that he might have Crohns gastroenteritis, I will schedule him for a small bowel follow-through x-ray series. Attending:  Verlin Grills, M.D. DD:  01/11/01 TD:  01/11/01 Job: [id number]

## 2010-12-16 ENCOUNTER — Encounter: Payer: Self-pay | Admitting: Family Medicine

## 2010-12-17 ENCOUNTER — Ambulatory Visit: Payer: Self-pay | Admitting: Family Medicine

## 2010-12-20 ENCOUNTER — Encounter: Payer: Self-pay | Admitting: Family Medicine

## 2010-12-20 ENCOUNTER — Ambulatory Visit (INDEPENDENT_AMBULATORY_CARE_PROVIDER_SITE_OTHER): Payer: Self-pay | Admitting: Family Medicine

## 2010-12-20 VITALS — BP 130/90 | Temp 99.0°F | Wt 178.0 lb

## 2010-12-20 DIAGNOSIS — M546 Pain in thoracic spine: Secondary | ICD-10-CM

## 2010-12-20 MED ORDER — GABAPENTIN (ONCE-DAILY) 300 MG PO TABS: 1.0000 | ORAL_TABLET | Freq: Three times a day (TID) | ORAL | Status: DC

## 2010-12-20 NOTE — Patient Instructions (Signed)
Start gabapentin one at night for 3 days then increase to one twice a day.  After one more week increase to one three times daily as needed.

## 2010-12-20 NOTE — Progress Notes (Signed)
  Subjective:    Patient ID: Kevin Jackson, male    DOB: 1970/06/05, 40 y.o.   MRN: [id number]  HPI Patient has history of chronic thoracic back pain. About 4 years ago reportedly had T7 disc surgery. Since then intermittent pain mostly to the right of thoracic spine around T6-T7 level. He has seen multiple specialists and has had epidurals and TENS units previously. Poor relief with opioids. At one point was taking Lyrica which did seem to help some. He also has some neuropathic type pains right chest wall related to this surgery. The pain is sharp and off and on but relatively continuous over the past 4 years.  Lost insurance and fell away from care of specialists.  Dark spot which is scaly and comes and goes right side of nose past 6-8 months. No pruritis and no pain. No history of skin cancer  Patient relates 2-3 week history of intermittent symptoms of losing his balance to the left side. No focal weakness. Denies any vertigo. No headaches. Denies dysarthria or dysphagia. He's had some occasional tremors upper extremities but nonfocal. No focal weakness. No visual changes.  Review of Systems  Constitutional: Negative for fever, activity change, appetite change, fatigue and unexpected weight change.  Eyes: Negative for visual disturbance.  Respiratory: Negative for shortness of breath.   Genitourinary: Negative for dysuria and hematuria.  Musculoskeletal: Positive for back pain. Negative for myalgias.  Neurological: Positive for dizziness. Negative for seizures, syncope, weakness and headaches.  Hematological: Negative for adenopathy. Does not bruise/bleed easily.       Objective:   Physical Exam  Constitutional: He is oriented to person, place, and time. He appears well-developed and well-nourished. No distress.  Cardiovascular: Normal rate, regular rhythm and normal heart sounds.   No murmur heard. Pulmonary/Chest: Effort normal and breath sounds normal. No respiratory  distress. He has no wheezes. He has no rales.       Patient has scar right thoracic back region. No spinal tenderness. Minimal parathoracic muscular tenderness. No visible swelling  Musculoskeletal: He exhibits no edema.  Neurological: He is alert and oriented to person, place, and time. No cranial nerve deficit.       Cerebellar normal by finger to nose and gait normal.  Skin:       No definitive skin lesion on the face. He has areas minimal hyperpigmentation which is light brown right side of nose but no nodular changes and no telangiectasias or ulceration.  Psychiatric: He has a normal mood and affect. His behavior is normal. Thought content normal.          Assessment & Plan:  #1 chronic thoracic back pain following surgery 4 years ago for T7 disc. Suspect good amount of this may be more neuropathic. Start gabapentin 300 mg at night and after 3 days titrate to twice a day. After one week of twice a day if pain not well controlled titrate 3 times a day. Followup with primary in one month.  Recommend that he re-establish with orthopedist. #2 intermittent scaly lesion right side of nose -question seborrheic keratosis. Consider light liquid nitrogen if persists #3 question ataxia by history intermittently past few weeks. ?postviral Nonfocal exam his time. No headaches. If symptoms persist MRI of brain to further assess

## 2011-01-20 ENCOUNTER — Ambulatory Visit (INDEPENDENT_AMBULATORY_CARE_PROVIDER_SITE_OTHER): Payer: Self-pay | Admitting: Family Medicine

## 2011-01-20 ENCOUNTER — Encounter: Payer: Self-pay | Admitting: Family Medicine

## 2011-01-20 VITALS — BP 110/80 | Temp 98.9°F | Wt 182.0 lb

## 2011-01-20 DIAGNOSIS — M545 Low back pain, unspecified: Secondary | ICD-10-CM

## 2011-01-20 MED ORDER — GABAPENTIN (ONCE-DAILY) 300 MG PO TABS: 1.0000 | ORAL_TABLET | Freq: Three times a day (TID) | ORAL | Status: DC

## 2011-01-20 NOTE — Progress Notes (Signed)
  Subjective:    Patient ID: Kevin Jackson, male    DOB: 18-Dec-1970, 40 y.o.   MRN: [id number]  HPI [name] is a 40 year old male, who comes in today for follow-up of back pain.  In September 2008 he had a T7 decompression by [name].  No history of trauma.  Subsequently, he developed severe back pain.  They tried many different medications.  Nothing seemed to help.  A month ago.  He came in saw [name],,,,,,,,, to start him on Neurontin 300 mg t.i.d. And within 3 days.  His back pain was gone.  He says this is the best is felt in years.   Review of Systems General and neurologic review of systems otherwise negative    Objective:   Physical Exam  Well-developed well-nourished man in acute distress.  Examination of back shows a scar from previous surgery.  Otherwise, normal     Assessment & Plan:  Neuropathic back pain.  Plan continue Neurontin 300 t.i.d.

## 2011-01-20 NOTE — Patient Instructions (Signed)
Continue the Neurontin 300 mg 3 times daily.  Return in one year or sooner if any problems

## 2011-01-31 ENCOUNTER — Ambulatory Visit (INDEPENDENT_AMBULATORY_CARE_PROVIDER_SITE_OTHER): Payer: Self-pay | Admitting: Family Medicine

## 2011-01-31 ENCOUNTER — Encounter: Payer: Self-pay | Admitting: Family Medicine

## 2011-01-31 DIAGNOSIS — B369 Superficial mycosis, unspecified: Secondary | ICD-10-CM

## 2011-01-31 DIAGNOSIS — M545 Low back pain: Secondary | ICD-10-CM

## 2011-01-31 MED ORDER — TRAMADOL HCL 50 MG PO TABS: 50.0000 mg | ORAL_TABLET | Freq: Four times a day (QID) | ORAL | Status: DC | PRN

## 2011-01-31 MED ORDER — CYCLOBENZAPRINE HCL 5 MG PO TABS: 5.0000 mg | ORAL_TABLET | Freq: Three times a day (TID) | ORAL | Status: DC | PRN

## 2011-01-31 NOTE — Patient Instructions (Signed)
Applying over-the-counter anti-fungal cream 3 times daily until the rash is 100% gone.  Flexor and terminal,,,,,,,,,,, one half or one tablet of each 3 times a day as needed for severe pain.  [facility] and see the orthopedist ASAP

## 2011-01-31 NOTE — Progress Notes (Signed)
  Subjective:    Patient ID: Kevin Jackson, male    DOB: 11-22-1970, 40 y.o.   MRN: [id number]  HPI [name] is a 40 year old male, single, nonsmoker, who comes in today for evaluation of two problems.  He states that about 3 days ago he noticed some rash on his upper lip and now is involving the right and left upper face.  These concerned it may be an allergic reaction to the Neurontin however, he does not have a systemic reaction.  Its local confined to the face.  Also 3 days ago he began having severe back pain radiating down to his right heel.  He describes the pain as constant and achy type pain.  A7 on a scale of one to 10 again radiating to his right heel.  If he bends his leg.  The pain diminishes, but does not go away.  He had a similar episode like this in 2010 with a greenish orthopedics.  Subsequently had epidural steroid injections, which resolved the pain.   Review of Systems General dermatologic no muscular review of systems otherwise negative   Objective:   Physical Exam  Well-developed well-nourished man in no acute distress.  Neurologic examination lower extremities.  Normal examination of face shows a rash consistent with a fungal dermatitis      Assessment & Plan:  Fungal dermatitis,,,,,,,, OTC medication.  Lumbar disk disease with acute pain right down to right heel.  Plan Flexeril tramadol,.........Marland KitchenOrthopedic consult ASAP

## 2011-02-22 ENCOUNTER — Emergency Department (HOSPITAL_COMMUNITY)
Admission: EM | Admit: 2011-02-22 | Discharge: 2011-02-22 | Disposition: A | Discharge status: Home / Self Care | Payer: Self-pay | Attending: Emergency Medicine | Admitting: Emergency Medicine

## 2011-02-22 ENCOUNTER — Emergency Department (HOSPITAL_COMMUNITY): Payer: Self-pay

## 2011-02-22 ENCOUNTER — Telehealth: Payer: Self-pay | Admitting: *Deleted

## 2011-02-22 DIAGNOSIS — IMO0002 Reserved for concepts with insufficient information to code with codable children: Secondary | ICD-10-CM | POA: Insufficient documentation

## 2011-02-22 DIAGNOSIS — M79609 Pain in unspecified limb: Secondary | ICD-10-CM | POA: Insufficient documentation

## 2011-02-22 DIAGNOSIS — R29898 Other symptoms and signs involving the musculoskeletal system: Secondary | ICD-10-CM | POA: Insufficient documentation

## 2011-02-22 DIAGNOSIS — M533 Sacrococcygeal disorders, not elsewhere classified: Secondary | ICD-10-CM | POA: Insufficient documentation

## 2011-02-22 NOTE — Telephone Encounter (Signed)
Left message on machine for patient  To schedule appointment

## 2011-02-22 NOTE — Telephone Encounter (Signed)
Please schedule an office visit Thursday

## 2011-02-22 NOTE — Telephone Encounter (Signed)
Pt is still complaining of back pain, and has to either see a specialist or go to the ER for a "pinched nerve.".  Is asking Dr. Nelida Meuse advice since he does not have insurance to cover multiple visits.

## 2011-02-22 NOTE — Telephone Encounter (Signed)
Pt is calling back.  Call sent to [facility].

## 2011-02-23 LAB — ACETAMINOPHEN LEVEL: Acetaminophen (Tylenol), Serum: 98.4 — ABNORMAL HIGH

## 2011-02-23 LAB — DIFFERENTIAL
Lymphocytes Relative: 21
Lymphs Abs: 1.1
Neutrophils Relative %: 69

## 2011-02-23 LAB — URINALYSIS, ROUTINE W REFLEX MICROSCOPIC
Bilirubin Urine: NEGATIVE
Glucose, UA: NEGATIVE
Nitrite: NEGATIVE
Specific Gravity, Urine: 1.018
pH: 6

## 2011-02-23 LAB — CBC
Hemoglobin: 14.6
MCHC: 35
MCV: 87
RBC: 4.79

## 2011-02-23 LAB — COMPREHENSIVE METABOLIC PANEL
CO2: 27
Calcium: 9.5
Creatinine, Ser: 0.68
GFR calc Af Amer: >60
GFR calc non Af Amer: >60
Glucose, Bld: 112 — ABNORMAL HIGH

## 2011-02-23 LAB — RAPID URINE DRUG SCREEN, HOSP PERFORMED
Cocaine: NOT DETECTED
Opiates: POSITIVE — AB
Tetrahydrocannabinol: NOT DETECTED

## 2011-02-23 LAB — PROTIME-INR
INR: 1.1
Prothrombin Time: 14.7

## 2011-02-23 LAB — SALICYLATE LEVEL: Salicylate Lvl: <4

## 2011-03-02 ENCOUNTER — Other Ambulatory Visit (HOSPITAL_COMMUNITY): Payer: Self-pay | Admitting: Emergency Medicine

## 2011-03-02 DIAGNOSIS — M549 Dorsalgia, unspecified: Secondary | ICD-10-CM

## 2011-03-03 LAB — URINALYSIS, ROUTINE W REFLEX MICROSCOPIC
Hgb urine dipstick: NEGATIVE
Specific Gravity, Urine: 1.027
Urobilinogen, UA: 1
pH: 6

## 2011-03-03 LAB — CBC
HCT: 36.2 — ABNORMAL LOW
Hemoglobin: 12.9 — ABNORMAL LOW
MCHC: 35.3
MCHC: 35.6
MCV: 88.8
RDW: 12.5
RDW: 12.6

## 2011-03-03 LAB — PROTIME-INR: INR: 1

## 2011-03-03 LAB — BASIC METABOLIC PANEL
CO2: 28
Chloride: 104
Glucose, Bld: 117 — ABNORMAL HIGH
Potassium: 3.8
Sodium: 137

## 2011-03-03 LAB — COMPREHENSIVE METABOLIC PANEL
ALT: 21
AST: 33
Calcium: 8.4
Creatinine, Ser: 0.83
GFR calc Af Amer: >60
Sodium: 138
Total Protein: 5.6 — ABNORMAL LOW

## 2011-03-04 LAB — BASIC METABOLIC PANEL
BUN: 8
CO2: 29
Chloride: 107
Creatinine, Ser: 1.08
GFR calc Af Amer: >60
Potassium: 3.4 — ABNORMAL LOW

## 2011-03-04 LAB — TYPE AND SCREEN: Antibody Screen: NEGATIVE

## 2011-03-04 LAB — DIFFERENTIAL
Basophils Relative: 1
Eosinophils Absolute: 0.1
Eosinophils Relative: 2
Lymphs Abs: 1.2
Monocytes Relative: 15 — ABNORMAL HIGH
Neutrophils Relative %: 54

## 2011-03-04 LAB — CBC
HCT: 41
MCHC: 35.8
MCV: 88
RBC: 4.65

## 2011-03-07 ENCOUNTER — Ambulatory Visit (HOSPITAL_COMMUNITY)
Admission: RE | Admit: 2011-03-07 | Discharge: 2011-03-07 | Disposition: A | Discharge status: Home / Self Care | Payer: Self-pay | Source: Ambulatory Visit | Attending: Emergency Medicine | Admitting: Emergency Medicine

## 2011-03-07 DIAGNOSIS — M549 Dorsalgia, unspecified: Secondary | ICD-10-CM

## 2011-03-07 DIAGNOSIS — M545 Low back pain, unspecified: Secondary | ICD-10-CM | POA: Insufficient documentation

## 2011-03-07 DIAGNOSIS — M79609 Pain in unspecified limb: Secondary | ICD-10-CM | POA: Insufficient documentation

## 2011-03-07 DIAGNOSIS — M5137 Other intervertebral disc degeneration, lumbosacral region: Secondary | ICD-10-CM | POA: Insufficient documentation

## 2011-03-07 DIAGNOSIS — M129 Arthropathy, unspecified: Secondary | ICD-10-CM | POA: Insufficient documentation

## 2011-03-07 DIAGNOSIS — M51379 Other intervertebral disc degeneration, lumbosacral region without mention of lumbar back pain or lower extremity pain: Secondary | ICD-10-CM | POA: Insufficient documentation

## 2011-03-08 ENCOUNTER — Telehealth: Payer: Self-pay | Admitting: Family Medicine

## 2011-03-08 LAB — COMPREHENSIVE METABOLIC PANEL
Albumin: 4.5
Alkaline Phosphatase: 60
BUN: 5 — ABNORMAL LOW
CO2: 27
Chloride: 104
Creatinine, Ser: 1.04
GFR calc non Af Amer: >60
Glucose, Bld: 133 — ABNORMAL HIGH
Potassium: 3.2 — ABNORMAL LOW
Total Bilirubin: 1

## 2011-03-08 LAB — DIFFERENTIAL
Basophils Absolute: 0
Basophils Relative: 0
Lymphocytes Relative: 15
Monocytes Absolute: 0.4
Neutro Abs: 6.7

## 2011-03-08 LAB — CBC
HCT: 46.9
Hemoglobin: 16.5
MCV: 89.1
RBC: 5.26
WBC: 8.4

## 2011-03-08 LAB — RAPID URINE DRUG SCREEN, HOSP PERFORMED
Barbiturates: NOT DETECTED
Benzodiazepines: NOT DETECTED
Cocaine: NOT DETECTED

## 2011-03-08 LAB — ETHANOL: Alcohol, Ethyl (B): 177 — ABNORMAL HIGH

## 2011-03-08 NOTE — Telephone Encounter (Signed)
Requesting mri results from yesterday. Thanks.

## 2011-03-09 ENCOUNTER — Telehealth: Payer: Self-pay | Admitting: Family Medicine

## 2011-03-09 DIAGNOSIS — M549 Dorsalgia, unspecified: Secondary | ICD-10-CM

## 2011-03-09 NOTE — Telephone Encounter (Signed)
Pt requesting results of MRI. Please contact

## 2011-03-09 NOTE — Telephone Encounter (Signed)
Results to Dr Tawanna Cooler to review.  Results please of MRI.

## 2011-03-09 NOTE — Telephone Encounter (Signed)
Patient went to ER and they told them they would send

## 2011-03-10 NOTE — Telephone Encounter (Signed)
Kevin Jackson has ongoing back pain.  He went to the emergency room and they ordered an MRI.  The next step is to have him see a neurosurgeon for further evaluation.  Please set up a neurosurgical consult

## 2011-03-14 ENCOUNTER — Other Ambulatory Visit: Payer: Self-pay | Admitting: Family Medicine

## 2011-03-17 ENCOUNTER — Ambulatory Visit (INDEPENDENT_AMBULATORY_CARE_PROVIDER_SITE_OTHER): Payer: Self-pay | Admitting: Family Medicine

## 2011-03-17 ENCOUNTER — Encounter: Payer: Self-pay | Admitting: Family Medicine

## 2011-03-17 VITALS — BP 140/100 | Temp 98.7°F | Wt 178.0 lb

## 2011-03-17 DIAGNOSIS — I10 Essential (primary) hypertension: Secondary | ICD-10-CM | POA: Insufficient documentation

## 2011-03-17 MED ORDER — LISINOPRIL 10 MG PO TABS: 10.0000 mg | ORAL_TABLET | Freq: Every day | ORAL | Status: DC

## 2011-03-17 NOTE — Progress Notes (Signed)
  Subjective:    Patient ID: Kevin Jackson, male    DOB: 1971-01-27, 40 y.o.   MRN: [id number]  HPI Kevin Jackson is a 40 year old male, who comes in today for evaluation of two problems.  Recently he's felt flushed and rapid heart rate in the evening and begin monitoring.  His blood pressure at home, and it's been running 140 to 160 systolic, and the diastolic has been in the 9o   Both his mother, father, and sister have hypertension   Review of Systems General and cardiovascular review of systems otherwise negative except he went to the emergency room last week for evaluation of back pain.  They ordered an MRI.  We have referred him to neurosurgery.  They are currently reviewing his studies and will see him ASAP   Objective:   Physical Exam  Well-developed well-nourished man in no acute distress.  Heart lungs, normal BP 140/100 right arm sitting position      Assessment & Plan:  New-onset hypertension.........Marland Kitchen Zestril 10 mg daily follow-up in two weeks BP check.  Q.a.m..... He was cautioned about the side effects, which can be hives.  Airway edema and cough

## 2011-03-17 NOTE — Patient Instructions (Signed)
Begin lisinopril 10 mg one tab daily.  Check your blood pressure daily in the morning.  Return in two weeks for follow-up

## 2011-03-31 ENCOUNTER — Ambulatory Visit (INDEPENDENT_AMBULATORY_CARE_PROVIDER_SITE_OTHER): Payer: Self-pay | Admitting: Family Medicine

## 2011-03-31 ENCOUNTER — Encounter: Payer: Self-pay | Admitting: Family Medicine

## 2011-03-31 VITALS — BP 120/80 | HR 100 | Temp 99.0°F | Wt 178.0 lb

## 2011-03-31 DIAGNOSIS — I10 Essential (primary) hypertension: Secondary | ICD-10-CM

## 2011-03-31 MED ORDER — LISINOPRIL 10 MG PO TABS: 10.0000 mg | ORAL_TABLET | Freq: Every day | ORAL | Status: DC

## 2011-03-31 NOTE — Progress Notes (Signed)
  Subjective:    Patient ID: Kevin Jackson, male    DOB: Jul 02, 1970, 40 y.o.   MRN: [id number]  HPI Jarod is a 40 year old male, who comes in today for follow-up of hypertension.  We started him on lisinopril 10 mg a day.  BP now is 120/80.  No side effects from medication.   Review of Systems    General and cardiovascular review systems otherwise negative Objective:   Physical Exam  Well-developed well-nourished man in no acute distress.  BP right arm sitting position 120/80      Assessment & Plan:  Hypertension adult continue lisinopril 10 mg daily BP check weekly return one year or sooner if any problems

## 2011-03-31 NOTE — Patient Instructions (Signed)
Continued to take her medication, one tablet daily.  Check a blood pressure in the morning, once a week.  Return in one year, sooner for any problems

## 2011-04-04 ENCOUNTER — Other Ambulatory Visit: Payer: Self-pay | Admitting: Internal Medicine

## 2011-04-07 ENCOUNTER — Other Ambulatory Visit (HOSPITAL_COMMUNITY): Payer: Self-pay | Admitting: Neurosurgery

## 2011-04-07 DIAGNOSIS — M542 Cervicalgia: Secondary | ICD-10-CM

## 2011-04-07 DIAGNOSIS — M549 Dorsalgia, unspecified: Secondary | ICD-10-CM

## 2011-04-12 ENCOUNTER — Ambulatory Visit (HOSPITAL_COMMUNITY)
Admission: RE | Admit: 2011-04-12 | Discharge: 2011-04-12 | Disposition: A | Discharge status: Home / Self Care | Payer: Self-pay | Source: Ambulatory Visit | Attending: Neurosurgery | Admitting: Neurosurgery

## 2011-04-12 ENCOUNTER — Ambulatory Visit (HOSPITAL_COMMUNITY): Admission: RE | Admit: 2011-04-12 | Payer: Self-pay | Source: Ambulatory Visit

## 2011-04-12 DIAGNOSIS — M549 Dorsalgia, unspecified: Secondary | ICD-10-CM | POA: Insufficient documentation

## 2011-04-12 DIAGNOSIS — M542 Cervicalgia: Secondary | ICD-10-CM | POA: Insufficient documentation

## 2011-04-12 DIAGNOSIS — M5020 Other cervical disc displacement, unspecified cervical region: Secondary | ICD-10-CM | POA: Insufficient documentation

## 2011-04-12 DIAGNOSIS — IMO0002 Reserved for concepts with insufficient information to code with codable children: Secondary | ICD-10-CM | POA: Insufficient documentation

## 2011-04-12 DIAGNOSIS — Z9889 Other specified postprocedural states: Secondary | ICD-10-CM | POA: Insufficient documentation

## 2011-05-07 ENCOUNTER — Encounter: Payer: Self-pay | Admitting: Family Medicine

## 2011-05-07 ENCOUNTER — Ambulatory Visit (INDEPENDENT_AMBULATORY_CARE_PROVIDER_SITE_OTHER): Payer: Self-pay | Admitting: Family Medicine

## 2011-05-07 VITALS — BP 116/86 | HR 73 | Temp 97.5°F | Ht 71.0 in | Wt 179.0 lb

## 2011-05-07 DIAGNOSIS — R300 Dysuria: Secondary | ICD-10-CM | POA: Insufficient documentation

## 2011-05-07 LAB — POCT URINALYSIS DIPSTICK
Bilirubin, UA: NEGATIVE
Blood, UA: NEGATIVE
Leukocytes, UA: NEGATIVE
Nitrite, UA: NEGATIVE
Protein, UA: NEGATIVE
pH, UA: 5

## 2011-05-07 NOTE — Progress Notes (Signed)
  Subjective:    Patient ID: Kevin Jackson, male    DOB: 05-28-1970, 40 y.o.   MRN: [id number]  HPI Dysuria- first urine of the AM was cloudy and towards the end was burning.  Had some dribbling this AM w/ increased frequency and urgency.  Drank 2 glasses of cranberry juice and sxs improved.  Denies suprapubic or CVA tenderness.  Admits to increased soda and tea intake.  No pain w/ urinating for sample.   Review of Systems For ROS see HPI     Objective:   Physical Exam  Vitals reviewed. Constitutional: He appears well-developed and well-nourished. No distress.  Abdominal: Soft. He exhibits no distension. There is no tenderness (no suprapubic or CVA tenderness).          Assessment & Plan:

## 2011-05-07 NOTE — Patient Instructions (Signed)
Right now the urine looks good- no sign of infection Drink plenty of fluids!!! Try and limit your caffeine intake Call with any questions or concerns Hang in there! Happy Holidays!

## 2011-05-07 NOTE — Assessment & Plan Note (Signed)
Pt's UA WNL w/ exception of ketones (no sugar, leuks, nitrates, blood).  sxs have resolved.  No need for abx at this time.  Encouraged fluid intake, limiting caffeine.  Pt expressed understanding and is in agreement w/ plan.

## 2011-10-09 ENCOUNTER — Encounter: Payer: Self-pay | Admitting: *Deleted

## 2011-10-09 ENCOUNTER — Emergency Department (INDEPENDENT_AMBULATORY_CARE_PROVIDER_SITE_OTHER)
Admission: EM | Admit: 2011-10-09 | Discharge: 2011-10-09 | Disposition: A | Discharge status: Home / Self Care | Payer: Self-pay | Source: Home / Self Care

## 2011-10-09 DIAGNOSIS — S61349A Puncture wound with foreign body of unspecified finger with damage to nail, initial encounter: Secondary | ICD-10-CM

## 2011-10-09 DIAGNOSIS — S61209A Unspecified open wound of unspecified finger without damage to nail, initial encounter: Secondary | ICD-10-CM

## 2011-10-09 HISTORY — DX: Essential (primary) hypertension: I10

## 2011-10-09 MED ORDER — TETANUS-DIPHTH-ACELL PERTUSSIS 5-2.5-18.5 LF-MCG/0.5 IM SUSP: 0.5000 mL | Freq: Once | INTRAMUSCULAR | Status: DC

## 2011-10-09 MED ORDER — CEPHALEXIN 500 MG PO CAPS: 500.0000 mg | ORAL_CAPSULE | Freq: Four times a day (QID) | ORAL | Status: AC

## 2011-10-09 MED ORDER — HYDROCODONE-ACETAMINOPHEN 5-500 MG PO TABS: 1.0000 | ORAL_TABLET | Freq: Four times a day (QID) | ORAL | Status: AC | PRN

## 2011-10-09 MED ORDER — TETANUS-DIPHTH-ACELL PERTUSSIS 5-2.5-18.5 LF-MCG/0.5 IM SUSP
0.5000 mL | Freq: Once | INTRAMUSCULAR | Status: AC
  Administered 2011-10-09: 0.5 mL via INTRAMUSCULAR

## 2011-10-09 NOTE — ED Notes (Signed)
Pt was changing brakes on a car last night and the screwdriver slipped and went into his finger on his Rt hand.  Finger is painful red and purple with slight bleeding.

## 2011-10-09 NOTE — ED Provider Notes (Signed)
History     CSN: [id number]  Arrival date & time 10/09/11  1142   None     Chief Complaint  Patient presents with  . Puncture Wound    L hand finger    (Consider location/radiation/quality/duration/timing/severity/associated sxs/prior treatment) The history is provided by the patient.   41 y.o. male sustained puncture wound to left first digit at distal end 16 hours ago. Ashby Dawes of injury: working on car and screw driver punctured finger. Throbbing pain 8/10 associated-worse with movement and pressure, pt cleaned wound with peroxide and applied bandage at incidence. Tetanus vaccination status reviewed: last tetanus >10 years ago. No paresthesias or change in hand function.  Past Medical History  Diagnosis Date  . Neuralgia   . Hypertension     Past Surgical History  Procedure Date  . Back surgery   . Appendectomy   . Tonsillectomy     Family History  Problem Relation Age of Onset  . Hypertension Mother   . Asthma Mother   . Cancer Paternal Uncle     intestinal    History  Substance Use Topics  . Smoking status: Former Smoker    Quit date: 05/23/1997  . Smokeless tobacco: Not on file  . Alcohol Use: Yes      Review of Systems  All other systems reviewed and are negative.    Allergies  Review of patient's allergies indicates no known allergies.  Home Medications   Current Outpatient Rx  Name Route Sig Dispense Refill  . CEPHALEXIN 500 MG PO CAPS Oral Take 1 capsule (500 mg total) by mouth 4 (four) times daily. For 10 days 40 capsule 0  . CYCLOBENZAPRINE HCL 5 MG PO TABS Oral Take 1 tablet (5 mg total) by mouth every 8 (eight) hours as needed for muscle spasms. 50 tablet 1  . GABAPENTIN (PHN) 300 MG PO TABS Oral Take 1 capsule by mouth 3 (three) times daily. 300 tablet 3  . HYDROCODONE-ACETAMINOPHEN 5-500 MG PO TABS Oral Take 1-2 tablets by mouth every 6 (six) hours as needed for pain. 15 tablet 0  . LISINOPRIL 10 MG PO TABS Oral Take 1 tablet (10 mg  total) by mouth daily. 100 tablet 3    BP 121/81  Pulse 82  Temp(Src) 98.6 F (37 C) (Oral)  Resp 16  Ht 5\' 11"  (1.803 m)  Wt 184 lb (83.462 kg)  BMI 25.66 kg/m2  SpO2 99%  Physical Exam  Nursing note and vitals reviewed. Constitutional: He is oriented to person, place, and time. Vital signs are normal. He appears well-developed and well-nourished. He is active and cooperative.  HENT:  Head: Normocephalic.  Eyes: Conjunctivae are normal. Pupils are equal, round, and reactive to light. No scleral icterus.  Neck: Trachea normal. Neck supple.  Cardiovascular: Normal rate and regular rhythm.   Pulses:      Radial pulses are 3+ on the right side, and 3+ on the left side.  Pulmonary/Chest: Effort normal and breath sounds normal.  Musculoskeletal: Normal range of motion.       Right wrist: Normal.       Left wrist: Normal.       Arms: Neurological: He is alert and oriented to person, place, and time. He has normal strength. No cranial nerve deficit or sensory deficit.  Skin: Skin is warm and dry. Laceration noted.  Psychiatric: He has a normal mood and affect. His speech is normal and behavior is normal. Judgment and thought content normal. Cognition and memory are  normal.    ED Course  Procedures (including critical care time)  Labs Reviewed - No data to display No results found.   1. Puncture wound of finger with foreign body with damage to nail, initial encounter       MDM  Wound cleansed, debrided of visible foreign material and necrotic tissue, and sutured. Antibiotic ointment and dressing applied.  Wound care instructions provided.  Observe for any signs of infection or other problems.  Return for wound check in 1 week.  Take antibiotic as prescribed.  Hydrocodone PRN pain.  You were given your tetanus shot today.           Johnsie Kindred, NP 10/09/11 1225

## 2011-10-09 NOTE — ED Provider Notes (Signed)
Pt seen by NP.  Agree with above.  No sutures required.  Marlaine Hind, MD 10/09/11 [phone]

## 2012-01-19 ENCOUNTER — Encounter: Payer: Self-pay | Admitting: Family Medicine

## 2012-01-19 ENCOUNTER — Telehealth: Payer: Self-pay | Admitting: Family Medicine

## 2012-01-19 ENCOUNTER — Ambulatory Visit (INDEPENDENT_AMBULATORY_CARE_PROVIDER_SITE_OTHER): Payer: BC Managed Care – PPO | Admitting: Family Medicine

## 2012-01-19 VITALS — BP 120/80 | Wt 187.0 lb

## 2012-01-19 DIAGNOSIS — D229 Melanocytic nevi, unspecified: Secondary | ICD-10-CM

## 2012-01-19 DIAGNOSIS — D239 Other benign neoplasm of skin, unspecified: Secondary | ICD-10-CM

## 2012-01-19 DIAGNOSIS — R05 Cough: Secondary | ICD-10-CM

## 2012-01-19 DIAGNOSIS — M542 Cervicalgia: Secondary | ICD-10-CM

## 2012-01-19 DIAGNOSIS — R059 Cough, unspecified: Secondary | ICD-10-CM

## 2012-01-19 HISTORY — DX: Melanocytic nevi, unspecified: D22.9

## 2012-01-19 MED ORDER — DIAZEPAM 2 MG PO TABS: ORAL_TABLET | ORAL | Status: DC

## 2012-01-19 MED ORDER — TRAMADOL HCL 50 MG PO TABS: ORAL_TABLET | ORAL | Status: DC

## 2012-01-19 NOTE — Telephone Encounter (Signed)
Ok to work in at the end of the day

## 2012-01-19 NOTE — Patient Instructions (Signed)
Valium and tramadol one of each at bedtime  o   X-ray of your neck tomorrow  We will call you a report on your x-ray

## 2012-01-19 NOTE — Telephone Encounter (Signed)
Patient called stating that he is experiencing  neck pain and popping in neck,swollen glands and would like to come in today for a visit. Please assist.

## 2012-01-19 NOTE — Progress Notes (Signed)
  Subjective:    Patient ID: Kevin Jackson, male    DOB: 1971/03/05, 41 y.o.   MRN: [id number]  HPI Tarron is a 41 year old married male nonsmoker....... 5 day old new daughter....... who comes in today for evaluation of 3 problems  He states for the past 2 months he said neck pain. No history of trauma. He describes it as constant, dull, a 5 on a scale of 1-10. The pain does not radiate. He denies any neurologic symptoms. No history of trauma. In 2008 he had surgery on his T7 spine by [name].  He's had a history of a dry cough he's had a pulmonary evaluation the past by Dr. Lacretia Nicks.  He also has 2 mol he would like checked in the groin area  No history of skin cancer   Review of Systems    general neurologic dermatologic and pulmonary review of systems otherwise negative Objective:   Physical Exam Well-developed well-nourished male in no acute distress examination of the neck shows full range of motion no palpable tenderness the neurologic exam the upper extremities shows normal sensation muscle strength and reflexes.  Cardiopulmonary exam normal ENT exam normal  Skin exam normal the 2 lesions in the groin area are dark but on illumination are dark brown no black pigment       Assessment & Plan:  Neck pain etiology unknown begin workup  Cough unknown etiology  Moles appear normal

## 2012-01-19 NOTE — Telephone Encounter (Signed)
Pt will come in today 445pm

## 2012-01-20 ENCOUNTER — Ambulatory Visit (INDEPENDENT_AMBULATORY_CARE_PROVIDER_SITE_OTHER)
Admission: RE | Admit: 2012-01-20 | Discharge: 2012-01-20 | Disposition: A | Discharge status: Home / Self Care | Payer: BC Managed Care – PPO | Source: Ambulatory Visit | Attending: Family Medicine | Admitting: Family Medicine

## 2012-01-20 DIAGNOSIS — M542 Cervicalgia: Secondary | ICD-10-CM

## 2012-01-24 ENCOUNTER — Telehealth: Payer: Self-pay | Admitting: Family Medicine

## 2012-01-24 NOTE — Telephone Encounter (Signed)
Caller: Karel/Patient; PCP: Kelle Darting (Family Practice); CB#: [phone]; Call regarding Would like xray results.  Seen in office 01/19/12 for neck pain/no injury, and had complete cervical spine xrays done 01/20/12. Per Epic, results on chart but not released/signed off by Dr. Tawanna Cooler; advised patient that when Dr. Tawanna Cooler reviews these, will get call back with next steps/further instructions.  Info to office for provider review/callback.

## 2012-01-25 NOTE — Telephone Encounter (Signed)
pls advise

## 2012-01-26 NOTE — Telephone Encounter (Signed)
Again the  C6-C7 cervical vertebrae shows a bone spur have him call his neurosurgeon for further evaluation

## 2012-01-26 NOTE — Telephone Encounter (Signed)
Patient is aware and copy mailed to home address. 

## 2012-02-15 ENCOUNTER — Other Ambulatory Visit: Payer: Self-pay | Admitting: Family Medicine

## 2012-02-16 ENCOUNTER — Telehealth: Payer: Self-pay | Admitting: Family Medicine

## 2012-02-16 DIAGNOSIS — I10 Essential (primary) hypertension: Secondary | ICD-10-CM

## 2012-02-16 MED ORDER — GABAPENTIN 300 MG PO CAPS: 300.0000 mg | ORAL_CAPSULE | Freq: Three times a day (TID) | ORAL | Status: DC

## 2012-02-16 MED ORDER — LISINOPRIL 10 MG PO TABS: 10.0000 mg | ORAL_TABLET | Freq: Every day | ORAL | Status: DC

## 2012-02-16 NOTE — Telephone Encounter (Signed)
Rx sent 

## 2012-02-16 NOTE — Telephone Encounter (Signed)
Pt is aware rx sent 

## 2012-02-16 NOTE — Telephone Encounter (Signed)
Patient called stating that he need a refill of his gabapentin and lisinopril sent to [facility] in [place]. Patient is completely out of the gabapentin. Please assist and call patient when done.

## 2012-03-28 ENCOUNTER — Telehealth: Payer: Self-pay | Admitting: Family Medicine

## 2012-03-28 NOTE — Telephone Encounter (Signed)
Caller: [name]/Patient; Patient Name: Kevin Jackson; PCP: Kelle Darting [facility]); Best Callback Phone Number: [phone]; Call regarding: Rt chest/abdominal pain; was working outside, bent over and straining; felt a pop below rt rib and noticed an area popped out below the rt rib the size of a baseball; was able to reduce it, but it is very tender to the touch; [name] says he has had the feeling before, but never seen an area come out like this; afebrile; All emergent sxs of Abdominal Pain protocol r/o except "new mass, lump or localized swelling or one  that is increasing in size"; disp see within 72 hrs; appt scheduled for 03/29/12 at 1230 with Dr.Todd; [name] also says that he has been told to see a neurosurgeon for his neck and wanted to know if the office could get him an appt with one because he doesn't know who to use since he doesn't want to see the same neurosurgeon he saw for his back

## 2012-03-28 NOTE — Telephone Encounter (Signed)
Please note part about neurosurgeon. Unclear if he asking for a referral, or just a recommendation.

## 2012-03-29 ENCOUNTER — Encounter: Payer: Self-pay | Admitting: Family Medicine

## 2012-03-29 ENCOUNTER — Ambulatory Visit (INDEPENDENT_AMBULATORY_CARE_PROVIDER_SITE_OTHER): Payer: BC Managed Care – PPO | Admitting: Family Medicine

## 2012-03-29 VITALS — BP 120/80 | Temp 98.5°F | Wt 189.0 lb

## 2012-03-29 DIAGNOSIS — R079 Chest pain, unspecified: Secondary | ICD-10-CM

## 2012-03-29 NOTE — Progress Notes (Signed)
  Subjective:    Patient ID: Kevin Jackson, male    DOB: 04/25/1971, 41 y.o.   MRN: [id number]  HPI Kevin Jackson is a 41 year old male who comes in today for evaluation of chest pain  He states since he's had his spinal surgery by [name] he's had recurrent episodes of right anterior chest wall pain. He points to the right 12th rib as a source of his pain. He states Wednesday at work he was bending using a wrench and developed a sudden onset of severe pain. When he looked there was a bulge. After about 5 minutes he states he pushed on the bulge it went away and his pain diminished. He has been evaluated by [name] for this problem and they cannot find any etiology  Cardiopulmonary as systems otherwise negative    Review of Systems    general GI cardiopulmonary of systems negative Objective:   Physical Exam  Well-developed well-nourished male in no acute distress cardiopulmonary and abdominal exams are normal      Assessment & Plan:  Right lower chest wall pain etiology unknown question the floating rib question question plan consult with

## 2012-03-29 NOTE — Patient Instructions (Signed)
We will get you set up a consult to see a rib and chest specialists for evaluation

## 2012-04-09 ENCOUNTER — Telehealth: Payer: Self-pay | Admitting: Family Medicine

## 2012-04-09 ENCOUNTER — Other Ambulatory Visit: Payer: Self-pay | Admitting: Family Medicine

## 2012-04-09 DIAGNOSIS — M549 Dorsalgia, unspecified: Secondary | ICD-10-CM

## 2012-04-09 NOTE — Telephone Encounter (Signed)
Pt states that  He is still having issue with his neck and states that his neck is popping and he is having pain pt would like to be referred to anyone but Dr. Jordan Likes

## 2012-04-09 NOTE — Telephone Encounter (Signed)
Spoke with patient.

## 2012-04-25 ENCOUNTER — Other Ambulatory Visit: Payer: Self-pay | Admitting: *Deleted

## 2012-04-25 ENCOUNTER — Encounter: Payer: Self-pay | Admitting: Cardiothoracic Surgery

## 2012-04-25 ENCOUNTER — Institutional Professional Consult (permissible substitution) (INDEPENDENT_AMBULATORY_CARE_PROVIDER_SITE_OTHER): Payer: BC Managed Care – PPO | Admitting: Cardiothoracic Surgery

## 2012-04-25 ENCOUNTER — Ambulatory Visit
Admission: RE | Admit: 2012-04-25 | Discharge: 2012-04-25 | Disposition: A | Discharge status: Home / Self Care | Payer: BC Managed Care – PPO | Source: Ambulatory Visit | Attending: Cardiothoracic Surgery | Admitting: Cardiothoracic Surgery

## 2012-04-25 VITALS — BP 123/74 | HR 76 | Resp 18 | Ht 71.0 in | Wt 189.0 lb

## 2012-04-25 DIAGNOSIS — I10 Essential (primary) hypertension: Secondary | ICD-10-CM

## 2012-04-25 DIAGNOSIS — R079 Chest pain, unspecified: Secondary | ICD-10-CM

## 2012-04-25 DIAGNOSIS — R0781 Pleurodynia: Secondary | ICD-10-CM

## 2012-04-25 DIAGNOSIS — G8912 Acute post-thoracotomy pain: Secondary | ICD-10-CM

## 2012-04-25 NOTE — Progress Notes (Signed)
PCP is TODD,JEFFREY Freida Busman, MD Referring Provider is Roderick Pee, MD  Chief Complaint  Patient presents with  . 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 The thoracotomy was performed by Dr. Edwyna Shell in the disc procedure was performed by [name]. The patient has had fairly classic right-sided subcostal dysesthetic pain, swelling sensation, numbness, and fairly debilitating pain. The pain has interfered with his employment, his family life, and has been a source of depression. The patient is currently not on narcotics. The patient is receiving Neurontin from his primary care physician which is helped some lower neck pain but not the right subcostal postthoracotomy pain. The patient states he is taken both Cymbalta as well as Lyrica in the past with some improvement of his postthoracotomy pain however due to loss of insurance he has not been on those medications for some time. He is currently employed and has insurance.  The patient denies any trauma to his chest following the thoracotomy exposure for thoracic spine surgery in 2008. He is a nonsmoker. His chest x-ray taken today shows clear lung fields normal chest x-ray   Past Medical History  Diagnosis Date  . Neuralgia   . Hypertension     Past Surgical History  Procedure Date  . Back surgery   . Appendectomy   . Tonsillectomy   . Right thoracotomy for exposure for the c7-t8 diskectomy 02/06/2007    Dr Edwyna Shell    Family History  Problem Relation Age of Onset  . Hypertension Mother   . Asthma Mother   . Cancer Paternal Uncle     intestinal    Social History History  Substance Use Topics  . Smoking status: Former Smoker    Quit date: 05/23/1997  . Smokeless tobacco: Not on file  . Alcohol Use: Yes     Current Outpatient Prescriptions  Medication Sig Dispense Refill  . gabapentin (NEURONTIN) 300 MG capsule Take 1 capsule (300 mg total) by mouth 3 (three) times daily.  90 capsule  5  . lisinopril (PRINIVIL,ZESTRIL) 10 MG tablet Take 1 tablet (10 mg total) by mouth daily.  100 tablet  3  . diazepam (VALIUM) 2 MG tablet 1 by mouth each bedtime  30 tablet  1  . traMADol (ULTRAM) 50 MG tablet 1 by mouth each bedtime for neck pain  30 tablet  1  . [DISCONTINUED] Gabapentin, PHN, 300 MG TABS Take 1 capsule by mouth 3 (three) times daily.  300 tablet  3    No Known Allergies  Review of Systems no weight loss or fever. No history of cardiac arrhythmia or angina. The patient has had a stress test he reports at an outside hospital which was normal. No history of diabetes. The patient still has significant back pain but no further surgery to be done according to the patient.  BP 123/74  Pulse 76  Resp 18  Ht 5\' 11"  (1.803 m)  Wt 189 lb (85.73 kg)  BMI 26.36 kg/m2  SpO2 98% Physical Exam Anxious but alert and appropriate HEENT normocephalic Neck without JVD mass or bruit Thorax well-healed right posterior thoracotomy incision, breath sounds clear Cardiac rhythm regular no murmur Abdomen soft nontender no mass or abdominal wall defect in the area the symptoms beneath the right costal margin  Extremities without edema or tenderness Vascular good pulses the extremities Neurologic right-hand dominant no focal motor weakness good grip bilaterally   Diagnostic Tests: Chest x-ray reviewed, within normal limits  Impression: 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 The patient has had a long history of pain problems related to his back and then his back surgery. There is no apparent anatomic reason for symptoms but they are very consistent with post thoracotomy neuritic-type pain. I recommended referral to the pain Centerfor more comprehensive management by physicians with  more experience in such a complicated patient.  Plan: Referred to the pain clinic iat [facility] return here when necessary

## 2013-03-04 ENCOUNTER — Other Ambulatory Visit: Payer: Self-pay | Admitting: Family Medicine

## 2013-05-21 ENCOUNTER — Emergency Department
Admission: EM | Admit: 2013-05-21 | Discharge: 2013-05-21 | Disposition: A | Discharge status: Home / Self Care | Payer: BC Managed Care – PPO | Source: Home / Self Care | Attending: Emergency Medicine | Admitting: Emergency Medicine

## 2013-05-21 ENCOUNTER — Emergency Department (INDEPENDENT_AMBULATORY_CARE_PROVIDER_SITE_OTHER): Payer: BC Managed Care – PPO

## 2013-05-21 ENCOUNTER — Encounter: Payer: Self-pay | Admitting: Emergency Medicine

## 2013-05-21 DIAGNOSIS — J0100 Acute maxillary sinusitis, unspecified: Secondary | ICD-10-CM

## 2013-05-21 DIAGNOSIS — R0989 Other specified symptoms and signs involving the circulatory and respiratory systems: Secondary | ICD-10-CM

## 2013-05-21 DIAGNOSIS — R042 Hemoptysis: Secondary | ICD-10-CM

## 2013-05-21 DIAGNOSIS — J209 Acute bronchitis, unspecified: Secondary | ICD-10-CM

## 2013-05-21 DIAGNOSIS — R509 Fever, unspecified: Secondary | ICD-10-CM

## 2013-05-21 DIAGNOSIS — R05 Cough: Secondary | ICD-10-CM

## 2013-05-21 MED ORDER — FLUTICASONE PROPIONATE 50 MCG/ACT NA SUSP: NASAL | Status: DC

## 2013-05-21 MED ORDER — PROMETHAZINE-CODEINE 6.25-10 MG/5ML PO SYRP: ORAL_SOLUTION | ORAL | Status: DC

## 2013-05-21 MED ORDER — AZITHROMYCIN 250 MG PO TABS: ORAL_TABLET | ORAL | Status: DC

## 2013-05-21 NOTE — ED Notes (Signed)
C/o congestion and cough with bloody mucous this AM. URI x 3 days. Denies fever.

## 2013-05-21 NOTE — ED Provider Notes (Signed)
CSN: [id number]     Arrival date & time 05/21/13  5784 History   First MD Initiated Contact with Patient 05/21/13 [phone]     Chief Complaint  Patient presents with  . Cough  . Nasal Congestion    Patient is a 42 y.o. male presenting with cough. The history is provided by the patient.  Cough Cough characteristics:  Productive Sputum characteristics:  Bloody and yellow Severity:  Moderate Onset quality:  Gradual Duration:  3 days Timing:  Intermittent Progression:  Worsening Chronicity:  New Smoker: no (Quit smoking 15 years ago)   Context: not exposure to allergens   Relieved by:  Nothing Ineffective treatments:  Fluids Associated symptoms: fever, rhinorrhea (Discolored) and sinus congestion   Associated symptoms: no chest pain, no chills, no ear fullness, no ear pain, no myalgias, no rash, no shortness of breath, no sore throat, no weight loss and no wheezing   Risk factors: no chemical exposure and no recent travel     Past Medical History  Diagnosis Date  . Neuralgia   . Hypertension    Past Surgical History  Procedure Laterality Date  . Back surgery    . Appendectomy    . Tonsillectomy    . Right thoracotomy for exposure for the c7-t8 diskectomy  02/06/2007    Dr Edwyna Shell  . Laparoscopic gastrotomy w/ repair of ulcer     Family History  Problem Relation Age of Onset  . Hypertension Mother   . Asthma Mother   . Cancer Paternal Uncle     intestinal   History  Substance Use Topics  . Smoking status: Former Smoker    Quit date: 05/23/1997  . Smokeless tobacco: Current User  . Alcohol Use: Yes    Review of Systems  Constitutional: Positive for fever. Negative for chills and weight loss.  HENT: Positive for rhinorrhea (Discolored). Negative for ear pain and sore throat.   Respiratory: Positive for cough. Negative for shortness of breath and wheezing.   Cardiovascular: Negative for chest pain.  Musculoskeletal: Negative for myalgias.  Skin: Negative for rash.     Allergies  Review of patient's allergies indicates no known allergies.  Home Medications   Current Outpatient Rx  Name  Route  Sig  Dispense  Refill  . azithromycin (ZITHROMAX Z-PAK) 250 MG tablet      Take 2 tablets on day one, then 1 tablet daily on days 2 through 5   1 each   0   . diazepam (VALIUM) 2 MG tablet      1 by mouth each bedtime   30 tablet   1   . fluticasone (FLONASE) 50 MCG/ACT nasal spray      1 or 2 sprays each nostril twice a day   16 g   0   . gabapentin (NEURONTIN) 300 MG capsule   Oral   Take 1 capsule (300 mg total) by mouth 3 (three) times daily.   90 capsule   5   . lisinopril (PRINIVIL,ZESTRIL) 10 MG tablet      TAKE 1 TABLET DAILY   250 tablet   0   . promethazine-codeine (PHENERGAN WITH CODEINE) 6.25-10 MG/5ML syrup      Take 1-2 teaspoons every 4-6 hours as needed for cough. May cause drowsiness.   120 mL   0   . traMADol (ULTRAM) 50 MG tablet      1 by mouth each bedtime for neck pain   30 tablet   1  BP 125/78  Pulse 82  Temp(Src) 98.3 F (36.8 C) (Oral)  Resp 16  Wt 190 lb (86.183 kg)  SpO2 99% Physical Exam  Nursing note and vitals reviewed. Constitutional: He is oriented to person, place, and time. He appears well-developed and well-nourished. No distress.  HENT:  Head: Normocephalic and atraumatic.  Right Ear: Tympanic membrane, external ear and ear canal normal.  Left Ear: Tympanic membrane, external ear and ear canal normal.  Nose: Mucosal edema and rhinorrhea present. Right sinus exhibits maxillary sinus tenderness. Left sinus exhibits maxillary sinus tenderness.  Mouth/Throat: Oropharynx is clear and moist. No oral lesions. No oropharyngeal exudate.  Eyes: Right eye exhibits no discharge. Left eye exhibits no discharge. No scleral icterus.  Neck: Neck supple.  Cardiovascular: Normal rate, regular rhythm and normal heart sounds.   Pulmonary/Chest: Effort normal. No respiratory distress. He has no  wheezes. He has rhonchi. He has no rales.  Lymphadenopathy:    He has no cervical adenopathy.  Neurological: He is alert and oriented to person, place, and time.  Skin: Skin is warm and dry.    ED Course  Procedures (including critical care time)  We discussed workup options, and we both agree that chest x-ray indicated based on his symptoms.  Labs Review Labs Reviewed - No data to display Imaging Review Dg Chest 2 View  05/21/2013   CLINICAL DATA:  Cough, fever and congestion.  EXAM: CHEST  2 VIEW  COMPARISON:  04/25/2012  FINDINGS: Heart size is normal. Mediastinal shadows are normal. The lungs are clear. No effusions. No bony abnormalities.  IMPRESSION: Normal chest   Electronically Signed   By: Paulina Fusi M.D.   On: 05/21/2013 10:34    EKG Interpretation    Date/Time:    Ventricular Rate:    PR Interval:    QRS Duration:   QT Interval:    QTC Calculation:   R Axis:     Text Interpretation:              MDM   1. Acute bronchitis   2. Hemoptysis   3. Acute maxillary sinusitis    Discussed normal chest x-ray with patient. Explained that the hemoptysis is likely from acute bronchitis, but that if he has persistent hemoptysis or any other symptoms, need to followup with pulmonary specialist. He voiced understanding and agreement Treatment options discussed, as well as risks, benefits, alternatives. Patient voiced understanding and agreement with the following plans: Z-Pak Flonase Phenergan with codeine, 1 or 2 teaspoons at bedtime when necessary cough precautions discussed Followup with PCP 5-7 days if not improving, sooner if worse or new symptoms Precautions discussed. Red flags discussed. Questions invited and answered. Patient voiced understanding and agreement.     Lajean Manes, MD 05/21/13 [phone]

## 2013-06-10 ENCOUNTER — Other Ambulatory Visit: Payer: Self-pay | Admitting: Family Medicine

## 2013-06-19 ENCOUNTER — Other Ambulatory Visit: Payer: Self-pay | Admitting: Family Medicine

## 2013-07-12 ENCOUNTER — Other Ambulatory Visit: Payer: Self-pay | Admitting: Family Medicine

## 2015-07-30 ENCOUNTER — Telehealth: Payer: Self-pay | Admitting: Behavioral Health

## 2015-07-30 NOTE — Telephone Encounter (Addendum)
Attempted to reach patient for Pre-Visit Call. Both the home and mobile numbers listed are non-working.

## 2015-07-31 ENCOUNTER — Encounter: Payer: Self-pay | Admitting: Physician Assistant

## 2015-07-31 ENCOUNTER — Ambulatory Visit (INDEPENDENT_AMBULATORY_CARE_PROVIDER_SITE_OTHER): Payer: Self-pay | Admitting: Physician Assistant

## 2015-07-31 VITALS — BP 130/90 | HR 91 | Temp 98.3°F | Ht 70.0 in | Wt 196.4 lb

## 2015-07-31 DIAGNOSIS — R002 Palpitations: Secondary | ICD-10-CM

## 2015-07-31 DIAGNOSIS — I10 Essential (primary) hypertension: Secondary | ICD-10-CM

## 2015-07-31 DIAGNOSIS — R9431 Abnormal electrocardiogram [ECG] [EKG]: Secondary | ICD-10-CM

## 2015-07-31 MED ORDER — LISINOPRIL 10 MG PO TABS: 10.0000 mg | ORAL_TABLET | Freq: Every day | ORAL | Status: DC

## 2015-07-31 MED FILL — LISINOPRIL 10 MG TABLET: 10 | 30 days supply | Qty: 30 | Fill #0

## 2015-07-31 NOTE — Progress Notes (Signed)
Pre visit review using our clinic review tool, if applicable. No additional management support is needed unless otherwise documented below in the visit note. 

## 2015-07-31 NOTE — Progress Notes (Signed)
Patient presents to clinic today to establish care.  Chronic Issues: Hypertension -- Diagnosed several years ago. Was previously on lisinopril 10 mg daily. Has been out of medication 6 months.  Endorses BP measurements ranging 130s/80s-150s/110s. Endorses moderate sodium in diet. Is staying active with his children. Patient denies chest pain. Endorses intermittent dizziness. Denies history of CAD, MI or CVA.   Past Medical History  Diagnosis Date  . Neuralgia   . Hypertension     Past Surgical History  Procedure Laterality Date  . Back surgery    . Appendectomy    . Tonsillectomy    . Right thoracotomy for exposure for the c7-t8 diskectomy  02/06/2007    Dr Arlyce Dice  . Laparoscopic gastrotomy w/ repair of ulcer      Current Outpatient Prescriptions on File Prior to Visit  Medication Sig Dispense Refill  . [DISCONTINUED] Gabapentin, PHN, 300 MG TABS Take 1 capsule by mouth 3 (three) times daily. 300 tablet 3   No current facility-administered medications on file prior to visit.    No Known Allergies  Family History  Problem Relation Age of Onset  . Hypertension Mother   . Asthma Mother   . Cancer Paternal Uncle     intestinal    Social History   Social History  . Marital Status: Divorced    Spouse Name: N/A  . Number of Children: N/A  . Years of Education: N/A   Occupational History  . Not on file.   Social History Main Topics  . Smoking status: Former Smoker    Quit date: 05/23/1997  . Smokeless tobacco: Current User  . Alcohol Use: Yes  . Drug Use: No  . Sexual Activity: Not on file   Other Topics Concern  . Not on file   Social History Narrative   Review of Systems  HENT: Negative for hearing loss.   Cardiovascular: Negative for chest pain, palpitations, orthopnea, claudication, leg swelling and PND.  Neurological: Positive for dizziness and headaches. Negative for loss of consciousness.  Psychiatric/Behavioral: Negative for depression, suicidal  ideas, hallucinations and substance abuse. The patient is not nervous/anxious and does not have insomnia.    BP 130/90 mmHg  Pulse 91  Temp(Src) 98.3 F (36.8 C) (Oral)  Ht 5\' 10"  (1.778 m)  Wt 196 lb 6.4 oz (89.086 kg)  BMI 28.18 kg/m2  SpO2 99%  Physical Exam  Constitutional: He is oriented to person, place, and time and well-developed, well-nourished, and in no distress.  HENT:  Head: Normocephalic and atraumatic.  Right Ear: External ear normal.  Left Ear: External ear normal.  Nose: Nose normal.  Mouth/Throat: Oropharynx is clear and moist. No oropharyngeal exudate.  Eyes: Conjunctivae are normal.  Neck: Neck supple.  Cardiovascular: Normal rate, regular rhythm, normal heart sounds and intact distal pulses.   Pulmonary/Chest: Effort normal and breath sounds normal. No respiratory distress. He has no wheezes. He has no rales. He exhibits no tenderness.  Neurological: He is alert and oriented to person, place, and time.  Skin: Skin is warm and dry. No rash noted.  Psychiatric: Affect normal.  Vitals reviewed.    Assessment/Plan: Shortened PR interval Reviewed EKG findings with Dr. Birdie Riddle, supervising MD who agrees that EKg without acute findings. Shortened PR interval noted on EKG. Patient with history of "flutter". No current symptoms. Will place urgent referral to Cardiology for assessment. Alarm symptoms reviewed. ER if any occur.  Hypertension Will begin lisinopril 10 mg daily. DASH diet reviewed. Follow-up 2-3 weeks  for CPE.

## 2015-07-31 NOTE — Patient Instructions (Addendum)
Please restart the lisinopril as directed.  Start a baby aspirin (81 mg) daily. No caffeine until Cardiology assessment. You will be contacted for appointment.  IF you have any chest pain, shortness of breath, please call 911 or go to the ER.  Follow-up 3-4 weeks.

## 2015-08-01 DIAGNOSIS — R9431 Abnormal electrocardiogram [ECG] [EKG]: Secondary | ICD-10-CM | POA: Insufficient documentation

## 2015-08-01 HISTORY — DX: Abnormal electrocardiogram (ECG) (EKG): R94.31

## 2015-08-01 NOTE — Assessment & Plan Note (Signed)
Will begin lisinopril 10 mg daily. DASH diet reviewed. Follow-up 2-3 weeks for CPE.

## 2015-08-01 NOTE — Assessment & Plan Note (Signed)
Reviewed EKG findings with Dr. Birdie Riddle, supervising MD who agrees that EKg without acute findings. Shortened PR interval noted on EKG. Patient with history of "flutter". No current symptoms. Will place urgent referral to Cardiology for assessment. Alarm symptoms reviewed. ER if any occur.

## 2015-08-04 ENCOUNTER — Encounter: Payer: Self-pay | Admitting: Physician Assistant

## 2015-08-10 ENCOUNTER — Encounter: Payer: Self-pay | Admitting: Physician Assistant

## 2015-08-10 ENCOUNTER — Institutional Professional Consult (permissible substitution): Payer: Self-pay | Admitting: Cardiology

## 2015-08-12 ENCOUNTER — Ambulatory Visit (INDEPENDENT_AMBULATORY_CARE_PROVIDER_SITE_OTHER): Payer: Self-pay | Admitting: Cardiology

## 2015-08-12 ENCOUNTER — Encounter: Payer: Self-pay | Admitting: Cardiology

## 2015-08-12 VITALS — BP 126/72 | HR 92 | Ht 70.0 in | Wt 194.0 lb

## 2015-08-12 DIAGNOSIS — I10 Essential (primary) hypertension: Secondary | ICD-10-CM

## 2015-08-12 DIAGNOSIS — R9431 Abnormal electrocardiogram [ECG] [EKG]: Secondary | ICD-10-CM

## 2015-08-12 DIAGNOSIS — R002 Palpitations: Secondary | ICD-10-CM

## 2015-08-12 DIAGNOSIS — R079 Chest pain, unspecified: Secondary | ICD-10-CM

## 2015-08-12 NOTE — Progress Notes (Signed)
Cardiology Office Note    Date:  08/12/2015   ID:  Kevin Jackson, DOB 05-Feb-1971, MRN [id number]  PCP:  Leeanne Rio, PA-C  Cardiologist:   Candee Furbish, MD     History of Present Illness:  Kevin Jackson is a 45 y.o. male here for evaluation of palpitations in the setting of short PR interval on EKG.  For last 3-4 years felt a vibration like sensation can't breath cant talk, would release then go back to rhythm. If yawn goes back to normal. No energy last week. Noted faster heart rate when laying down especially when laying on left (4 seconds). Had sensation of burning needles across chest. Back T-7 surgery many complications from that.   Has hypertension, currently well controlled. Cough since 2009. Off ACE for quite some time and still had cough however.   In [place] 2012 - CP with SOB, kept for 4 days, NUC stress, OK to home.   Also has dizziness, back pain, cough, chest pain, chest pressure, regular heartbeats, excessive fatigue, snoring, headaches.   Past Medical History  Diagnosis Date  . Neuralgia   . Hypertension     Past Surgical History  Procedure Laterality Date  . Back surgery    . Appendectomy    . Tonsillectomy    . Right thoracotomy for exposure for the c7-t8 diskectomy  02/06/2007    Dr Arlyce Dice  . Laparoscopic gastrotomy w/ repair of ulcer      Outpatient Prescriptions Prior to Visit  Medication Sig Dispense Refill  . lisinopril (PRINIVIL,ZESTRIL) 10 MG tablet Take 1 tablet (10 mg total) by mouth daily. 30 tablet 3   No facility-administered medications prior to visit.     Allergies:   Review of patient's allergies indicates no known allergies.   Social History   Social History  . Marital Status: Divorced    Spouse Name: N/A  . Number of Children: N/A  . Years of Education: N/A   Social History Main Topics  . Smoking status: Former Smoker    Quit date: 05/23/1997  . Smokeless tobacco: Current User  . Alcohol Use: Yes   . Drug Use: No  . Sexual Activity: Not Asked   Other Topics Concern  . None   Social History Narrative     Family History:  The patient's family history includes Asthma in his mother; Cancer in his paternal uncle; Healthy in his brother, sister, and son; Hypertension in his mother; Other in his brother; Scoliosis in his daughter.   ROS:   Please see the history of present illness.    ROS All other systems reviewed and are negative.   PHYSICAL EXAM:   VS:  BP 126/72 mmHg  Pulse 92  Ht 5\' 10"  (1.778 m)  Wt 194 lb (87.998 kg)  BMI 27.84 kg/m2   GEN: Well nourished, well developed, in no acute distress HEENT: normal Neck: no JVD, carotid bruits, or masses Cardiac: RRR; no murmurs, rubs, or gallops,no edema  Respiratory:  clear to auscultation bilaterally, normal work of breathing GI: soft, nontender, nondistended, + BS MS: no deformity or atrophy Skin: warm and dry, no rash Neuro:  Alert and Oriented x 3, Strength and sensation are intact Psych: euthymic mood, full affect  Wt Readings from Last 3 Encounters:  08/12/15 194 lb (87.998 kg)  07/31/15 196 lb 6.4 oz (89.086 kg)  05/21/13 190 lb (86.183 kg)      Studies/Labs Reviewed:   EKG:  EKG is not ordered today.  EKG  from 07/31/15 shows a fairly short PR interval with nonspecific QRS widening  Recent Labs: No results found for requested labs within last 365 days.   Lipid Panel No results found for: CHOL, TRIG, HDL, CHOLHDL, VLDL, LDLCALC, LDLDIRECT  Additional studies/ records that were reviewed today include:  Prior office notes, lab work, EKG    ASSESSMENT:    1. Chest pain, unspecified chest pain type   2. Palpitations      PLAN:  In order of problems listed above:  Short PR interval/chest pain -We'll check an exercise triple test. During the treadmill test we will carefully observe for any signs of QRS widening which could mean that he has a predilection towards an accessory pathway -Chest pain has  very's qualities to it including a burning type needlelike radiation across from his sternum outwards which sounds more neuropathic. He has had prior T7 back surgery. Pain that feels deep behind his left breast could also be musculoskeletal. Prior nuclear stress test in 2012 was reassuring in [place] after 4 day hospitalization. -I will also check an echocardiogram to ensure proper structure and function of his heart.   Palpitations -He describes what sounds like a potential SVT. Sometimes this is positional when he lays down especially on his left side and is alleviated by a yawn. I like to catch this action. Checking event monitor. If necessary, EP referral in the future. He sees that his baseline heart rate usually is 85-90. Consider metoprolol if necessary.  Essential hypertension -Currently well controlled on lisinopril. He does have a cough but he states that when he was off of his lisinopril for over 6 months he still had this cough. Unsure what the etiology of this is.    Medication Adjustments/Labs and Tests Ordered: Current medicines are reviewed at length with the patient today.  Concerns regarding medicines are outlined above.  Medication changes, Labs and Tests ordered today are listed in the Patient Instructions below. Patient Instructions  Your physician recommends that you continue on your current medications as directed. Please refer to the Current Medication list given to you today.  Your physician has requested that you have an exercise tolerance test. For further information please visit [email or web address]. Please also follow instruction sheet, as given.  Your physician has requested that you have an echocardiogram. Echocardiography is a painless test that uses sound waves to create images of your heart. It provides your doctor with information about the size and shape of your heart and how well your heart's chambers and valves are working. This procedure takes approximately one  hour. There are no restrictions for this procedure.  Your physician has recommended that you wear an event monitor. Event monitors are medical devices that record the heart's electrical activity. Doctors most often [place] these monitors to diagnose arrhythmias. Arrhythmias are problems with the speed or rhythm of the heartbeat. The monitor is a small, portable device. You can wear one while you do your normal daily activities. This is usually used to diagnose what is causing palpitations/syncope (passing out).         Bobby Rumpf, MD  08/12/2015 11:15 AM    [address], [place], [place]  [address] Phone: [phone]; Fax: [phone]

## 2015-08-12 NOTE — Patient Instructions (Addendum)
Your physician recommends that you continue on your current medications as directed. Please refer to the Current Medication list given to you today.  Your physician has requested that you have an exercise tolerance test. For further information please visit [email or web address]. Please also follow instruction sheet, as given.  Your physician has requested that you have an echocardiogram. Echocardiography is a painless test that uses sound waves to create images of your heart. It provides your doctor with information about the size and shape of your heart and how well your heart's chambers and valves are working. This procedure takes approximately one hour. There are no restrictions for this procedure.  Your physician has recommended that you wear an event monitor. Event monitors are medical devices that record the heart's electrical activity. Doctors most often [place] these monitors to diagnose arrhythmias. Arrhythmias are problems with the speed or rhythm of the heartbeat. The monitor is a small, portable device. You can wear one while you do your normal daily activities. This is usually used to diagnose what is causing palpitations/syncope (passing out).  Follow up with your physician will depend on test results.

## 2015-08-18 ENCOUNTER — Ambulatory Visit (INDEPENDENT_AMBULATORY_CARE_PROVIDER_SITE_OTHER): Payer: Self-pay

## 2015-08-18 ENCOUNTER — Ambulatory Visit: Payer: Self-pay

## 2015-08-18 DIAGNOSIS — R002 Palpitations: Secondary | ICD-10-CM

## 2015-08-18 DIAGNOSIS — R079 Chest pain, unspecified: Secondary | ICD-10-CM

## 2015-08-18 LAB — EXERCISE TOLERANCE TEST
CHL CUP RESTING HR STRESS: 87 {beats}/min
CHL CUP STRESS STAGE 1 HR: 101 {beats}/min
CHL CUP STRESS STAGE 1 SBP: 111 mmHg
CHL CUP STRESS STAGE 2 SPEED: 1 mph
CHL CUP STRESS STAGE 3 GRADE: 0 %
CHL CUP STRESS STAGE 3 HR: 99 {beats}/min
CHL CUP STRESS STAGE 3 SPEED: 1 mph
CHL CUP STRESS STAGE 4 DBP: 83 mmHg
CHL CUP STRESS STAGE 4 GRADE: 10 %
CHL CUP STRESS STAGE 6 HR: 164 {beats}/min
CHL CUP STRESS STAGE 7 GRADE: 0 %
CHL CUP STRESS STAGE 7 SPEED: 0 mph
CHL CUP STRESS STAGE 8 DBP: 96 mmHg
CHL CUP STRESS STAGE 8 GRADE: 0 %
CHL RATE OF PERCEIVED EXERTION: 18
CSEPED: 7 min
CSEPEDS: 18 s
CSEPHR: 93 %
CSEPPMHR: 93 %
Estimated workload: 8.9 METS
MPHR: 176 {beats}/min
Peak HR: 164 {beats}/min
Stage 1 DBP: 88 mmHg
Stage 1 Grade: 0 %
Stage 1 Speed: 0 mph
Stage 2 Grade: 0 %
Stage 2 HR: 100 {beats}/min
Stage 4 HR: 130 {beats}/min
Stage 4 SBP: 128 mmHg
Stage 4 Speed: 1.7 mph
Stage 5 Grade: 12 %
Stage 5 HR: 150 {beats}/min
Stage 5 Speed: 2.5 mph
Stage 6 Grade: 14 %
Stage 6 Speed: 3.4 mph
Stage 7 HR: 137 {beats}/min
Stage 8 HR: 102 {beats}/min
Stage 8 SBP: 142 mmHg
Stage 8 Speed: 0 mph

## 2015-08-27 ENCOUNTER — Other Ambulatory Visit: Payer: Self-pay

## 2015-08-27 ENCOUNTER — Ambulatory Visit (HOSPITAL_COMMUNITY): Payer: Self-pay | Attending: Cardiology

## 2015-08-27 DIAGNOSIS — I119 Hypertensive heart disease without heart failure: Secondary | ICD-10-CM | POA: Insufficient documentation

## 2015-08-27 DIAGNOSIS — Z87891 Personal history of nicotine dependence: Secondary | ICD-10-CM | POA: Insufficient documentation

## 2015-08-27 DIAGNOSIS — R002 Palpitations: Secondary | ICD-10-CM

## 2015-08-27 DIAGNOSIS — R079 Chest pain, unspecified: Secondary | ICD-10-CM

## 2015-08-27 DIAGNOSIS — Z8249 Family history of ischemic heart disease and other diseases of the circulatory system: Secondary | ICD-10-CM | POA: Insufficient documentation

## 2015-08-27 DIAGNOSIS — I340 Nonrheumatic mitral (valve) insufficiency: Secondary | ICD-10-CM | POA: Insufficient documentation

## 2015-08-28 ENCOUNTER — Ambulatory Visit (INDEPENDENT_AMBULATORY_CARE_PROVIDER_SITE_OTHER): Payer: Self-pay | Admitting: Physician Assistant

## 2015-08-28 ENCOUNTER — Encounter: Payer: Self-pay | Admitting: Physician Assistant

## 2015-08-28 VITALS — BP 120/84 | HR 93 | Temp 98.2°F | Ht 71.0 in | Wt 195.0 lb

## 2015-08-28 DIAGNOSIS — G8929 Other chronic pain: Secondary | ICD-10-CM

## 2015-08-28 DIAGNOSIS — R0789 Other chest pain: Secondary | ICD-10-CM

## 2015-08-28 DIAGNOSIS — I10 Essential (primary) hypertension: Secondary | ICD-10-CM

## 2015-08-28 HISTORY — DX: Other chest pain: R07.89

## 2015-08-28 HISTORY — DX: Other chronic pain: G89.29

## 2015-08-28 MED ORDER — HYDROCODONE-ACETAMINOPHEN 10-325 MG PO TABS: 1.0000 | ORAL_TABLET | Freq: Three times a day (TID) | ORAL | Status: DC | PRN

## 2015-08-28 MED FILL — HYDROCODON-APAP 10-325: 10-325 | 10 days supply | Qty: 30 | Fill #0

## 2015-08-28 MED FILL — LISINOPRIL 10 MG TABLET: 10 | 30 days supply | Qty: 30 | Fill #1

## 2015-08-28 NOTE — Progress Notes (Signed)
Patient presents to clinic today for follow-up of hypertension. Endorses taking lisinopril as directed without side effect. Patient denies chest pain, palpitations, lightheadedness, dizziness, vision changes or frequent headaches. Patient has had stress test and Echocardiogram due to prior complaints of chest pain, all within normal limits. Patient is following with Ortho for chest wall pain, thought to be secondary to prior rib surgeries.   BP Readings from Last 3 Encounters:  08/28/15 120/84  08/12/15 126/72  07/31/15 130/90     Past Medical History  Diagnosis Date  . Neuralgia   . Hypertension     Current Outpatient Prescriptions on File Prior to Visit  Medication Sig Dispense Refill  . lisinopril (PRINIVIL,ZESTRIL) 10 MG tablet Take 1 tablet (10 mg total) by mouth daily. 30 tablet 3  . [DISCONTINUED] Gabapentin, PHN, 300 MG TABS Take 1 capsule by mouth 3 (three) times daily. 300 tablet 3   No current facility-administered medications on file prior to visit.    No Known Allergies  Family History  Problem Relation Age of Onset  . Hypertension Mother   . Asthma Mother   . Cancer Paternal Uncle     intestinal  . Healthy Sister   . Healthy Brother   . Scoliosis Daughter   . Healthy Son   . Other Brother     tumors    Social History   Social History  . Marital Status: Divorced    Spouse Name: N/A  . Number of Children: N/A  . Years of Education: N/A   Social History Main Topics  . Smoking status: Former Smoker    Quit date: 05/23/1997  . Smokeless tobacco: Current User  . Alcohol Use: Yes  . Drug Use: No  . Sexual Activity: Not Asked   Other Topics Concern  . None   Social History Narrative    Review of Systems - See HPI.  All other ROS are negative.  BP 120/84 mmHg  Pulse 93  Temp(Src) 98.2 F (36.8 C) (Oral)  Ht 5\' 11"  (1.803 m)  Wt 195 lb (88.451 kg)  BMI 27.21 kg/m2  SpO2 97%  Physical Exam  Constitutional: He is oriented to person,  place, and time and well-developed, well-nourished, and in no distress.  HENT:  Head: Normocephalic and atraumatic.  Eyes: Conjunctivae are normal.  Cardiovascular: Normal rate, regular rhythm, normal heart sounds and intact distal pulses.   Pulmonary/Chest: Effort normal and breath sounds normal. No respiratory distress. He has no wheezes. He has no rales. He exhibits no tenderness.  Neurological: He is alert and oriented to person, place, and time.  Skin: Skin is warm and dry. No rash noted.  Psychiatric: Affect normal.  Vitals reviewed.   Recent Results (from the past 2160 hour(s))  Exercise Tolerance Test     Status: None   Collection Time: 08/18/15 10:38 AM  Result Value Ref Range   Rest HR 87 bpm   Rest BP 111/88 mmHg   Exercise duration (min) 7 min   Exercise duration (sec) 18 sec   Estimated workload 8.9 METS   Peak HR 164 BPM   Peak BP  mmHg   MPHR 176 bpm   Percent HR 93 %   RPE 18    Phase 1 name PRETEST    Stage 1 Name SITTING    Stage 1 Time 00:01:17    Stage 1 Speed 0.0 mph   Stage 1 Grade 0.0 %   Stage 1 HR 101 bpm   Stage 1 SBP  111 mmHg   Stage 1 DBP 88 mmHg   Phase 2 Name PRETEST    Stage 2 Name WARM-UP    Stage 2 Time 00:00:06    Stage 2 Speed 1.0 mph   Stage 2 Grade 0.0 %   Stage 2 HR 100 bpm   Phase 3 Name Exercise    Stage 3 Name STAGE 1    Stage 3 Attribute Baseline    Stage 3 Time 00:00:00    Stage 3 Speed 1.0 mph   Stage 3 Grade 0.0 %   Stage 3 HR 99 bpm   Phase 4 Name Exercise    Stage 4 Name STAGE 1    Stage 4 Time 00:03:00    Stage 4 Speed 1.7 mph   Stage 4 Grade 10.0 %   Stage 4 HR 130 bpm   Stage 4 SBP 128 mmHg   Stage 4 DBP 83 mmHg   Phase 5 Name Exercise    Stage 5 Name STAGE 2    Stage 5 Time 00:03:00    Stage 5 Speed 2.5 mph   Stage 5 Grade 12.0 %   Stage 5 HR 150 bpm   Phase 6 Name Exercise    Stage 6 Name STAGE 3    Stage 6 Attribute Peak    Stage 6 Time 00:01:18    Stage 6 Speed 3.4 mph   Stage 6 Grade 14.0 %    Stage 6 HR 164 bpm   Phase 7 Name Recovery    Stage 7 Attribute Recovery 41min    Stage 7 Time 00:01:01    Stage 7 Speed 0.0 mph   Stage 7 Grade 0.0 %   Stage 7 HR 137 bpm   Phase 8 Name Recovery    Stage 8 Time 00:05:18    Stage 8 Speed 0.0 mph   Stage 8 Grade 0.0 %   Stage 8 HR 102 bpm   Stage 8 SBP 142 mmHg   Stage 8 DBP 96 mmHg   Percent of predicted max HR 93 %    Assessment/Plan: Chronic chest wall pain Will begin Hydrocodone to help with chronic pain as Orthopedics has deferred pain management to [place] until he can have further treatments.   Hypertension Controlled. Continue current regimen. Follow-up as scheduled.

## 2015-08-28 NOTE — Progress Notes (Signed)
Pre visit review using our clinic review tool, if applicable. No additional management support is needed unless otherwise documented below in the visit note. 

## 2015-08-28 NOTE — Patient Instructions (Addendum)
Please take pain medication as directed when needed for severe pain. Please continue BP medication as directed. If you still have the symptoms when pain is controlled. We will consider adding the Metoprolol

## 2015-08-30 NOTE — Assessment & Plan Note (Signed)
Will begin Hydrocodone to help with chronic pain as Orthopedics has deferred pain management to [place] until he can have further treatments.

## 2015-08-30 NOTE — Assessment & Plan Note (Signed)
Controlled. Continue current regimen. Follow-up as scheduled.

## 2015-09-21 ENCOUNTER — Other Ambulatory Visit: Payer: Self-pay | Admitting: Physician Assistant

## 2015-09-21 DIAGNOSIS — R0789 Other chest pain: Principal | ICD-10-CM

## 2015-09-21 DIAGNOSIS — G8929 Other chronic pain: Secondary | ICD-10-CM

## 2015-09-23 MED ORDER — HYDROCODONE-ACETAMINOPHEN 10-325 MG PO TABS: 1.0000 | ORAL_TABLET | Freq: Three times a day (TID) | ORAL | Status: DC | PRN

## 2015-09-23 NOTE — Addendum Note (Signed)
Addended by: Raiford Noble on: 09/23/2015 03:25 PM   Modules accepted: Orders

## 2015-09-28 ENCOUNTER — Encounter: Payer: Self-pay | Admitting: Physician Assistant

## 2015-09-28 MED FILL — LISINOPRIL 10 MG TABLET: 10 | 30 days supply | Qty: 30 | Fill #2

## 2015-09-28 MED FILL — HYDROCODON-APAP 10-325: 10-325 | 10 days supply | Qty: 30 | Fill #0

## 2015-09-28 NOTE — Telephone Encounter (Signed)
Per Charlena Cross informed pt that wife is not on DPR and he will need to pick up RX and sign new DPR that she can pick up in future.

## 2015-10-24 ENCOUNTER — Emergency Department (HOSPITAL_COMMUNITY): Payer: Self-pay

## 2015-10-24 ENCOUNTER — Emergency Department (HOSPITAL_COMMUNITY)
Admission: EM | Admit: 2015-10-24 | Discharge: 2015-10-24 | Disposition: A | Discharge status: Home / Self Care | Payer: Self-pay | Attending: Emergency Medicine | Admitting: Emergency Medicine

## 2015-10-24 ENCOUNTER — Encounter (HOSPITAL_COMMUNITY): Payer: Self-pay

## 2015-10-24 DIAGNOSIS — I10 Essential (primary) hypertension: Secondary | ICD-10-CM | POA: Insufficient documentation

## 2015-10-24 DIAGNOSIS — N132 Hydronephrosis with renal and ureteral calculous obstruction: Secondary | ICD-10-CM | POA: Insufficient documentation

## 2015-10-24 DIAGNOSIS — Z7982 Long term (current) use of aspirin: Secondary | ICD-10-CM | POA: Insufficient documentation

## 2015-10-24 DIAGNOSIS — Z87891 Personal history of nicotine dependence: Secondary | ICD-10-CM | POA: Insufficient documentation

## 2015-10-24 DIAGNOSIS — N200 Calculus of kidney: Secondary | ICD-10-CM

## 2015-10-24 DIAGNOSIS — Z79899 Other long term (current) drug therapy: Secondary | ICD-10-CM | POA: Insufficient documentation

## 2015-10-24 HISTORY — DX: Gastro-esophageal reflux disease without esophagitis: K21.9

## 2015-10-24 HISTORY — DX: Calculus of kidney: N20.0

## 2015-10-24 LAB — COMPREHENSIVE METABOLIC PANEL
ALK PHOS: 63 U/L (ref 38–126)
ALT: 30 U/L (ref 17–63)
ANION GAP: 11 (ref 5–15)
AST: 23 U/L (ref 15–41)
Albumin: 4.6 g/dL (ref 3.5–5.0)
BILIRUBIN TOTAL: 1 mg/dL (ref 0.3–1.2)
BUN: 11 mg/dL (ref 6–20)
CHLORIDE: 103 mmol/L (ref 101–111)
CO2: 25 mmol/L (ref 22–32)
Calcium: 9.9 mg/dL (ref 8.9–10.3)
Creatinine, Ser: 1.01 mg/dL (ref 0.61–1.24)
GLUCOSE: 105 mg/dL — AB (ref 65–99)
POTASSIUM: 4 mmol/L (ref 3.5–5.1)
Sodium: 139 mmol/L (ref 135–145)
Total Protein: 7.8 g/dL (ref 6.5–8.1)

## 2015-10-24 LAB — CBC WITH DIFFERENTIAL/PLATELET
BASOS PCT: 0 %
Basophils Absolute: 0 10*3/uL (ref 0.0–0.1)
Eosinophils Absolute: 0.1 10*3/uL (ref 0.0–0.7)
Eosinophils Relative: 1 %
HEMATOCRIT: 45.5 % (ref 39.0–52.0)
HEMOGLOBIN: 16.6 g/dL (ref 13.0–17.0)
LYMPHS ABS: 1.6 10*3/uL (ref 0.7–4.0)
Lymphocytes Relative: 17 %
MCH: 31.4 pg (ref 26.0–34.0)
MCHC: 36.5 g/dL — AB (ref 30.0–36.0)
MCV: 86.2 fL (ref 78.0–100.0)
MONO ABS: 1.1 10*3/uL — AB (ref 0.1–1.0)
MONOS PCT: 11 %
NEUTROS ABS: 7 10*3/uL (ref 1.7–7.7)
Neutrophils Relative %: 71 %
Platelets: 290 10*3/uL (ref 150–400)
RBC: 5.28 MIL/uL (ref 4.22–5.81)
RDW: 12.6 % (ref 11.5–15.5)
WBC: 9.9 10*3/uL (ref 4.0–10.5)

## 2015-10-24 LAB — URINALYSIS, ROUTINE W REFLEX MICROSCOPIC
BILIRUBIN URINE: NEGATIVE
GLUCOSE, UA: NEGATIVE mg/dL
KETONES UR: NEGATIVE mg/dL
LEUKOCYTES UA: NEGATIVE
Nitrite: NEGATIVE
PH: 8 (ref 5.0–8.0)
PROTEIN: NEGATIVE mg/dL
Specific Gravity, Urine: 1.007 (ref 1.005–1.030)

## 2015-10-24 LAB — URINE MICROSCOPIC-ADD ON
BACTERIA UA: NONE SEEN
Squamous Epithelial / LPF: NONE SEEN

## 2015-10-24 MED ORDER — ONDANSETRON HCL 4 MG/2ML IJ SOLN
4.0000 mg | Freq: Once | INTRAMUSCULAR | Status: AC
  Administered 2015-10-24: 4 mg via INTRAVENOUS
  Filled 2015-10-24: qty 2

## 2015-10-24 MED ORDER — HYDROMORPHONE HCL 1 MG/ML IJ SOLN
1.0000 mg | Freq: Once | INTRAMUSCULAR | Status: AC
  Administered 2015-10-24: 1 mg via INTRAVENOUS
  Filled 2015-10-24: qty 1

## 2015-10-24 MED ORDER — ONDANSETRON 4 MG PO TBDP: ORAL_TABLET | ORAL | Status: DC

## 2015-10-24 MED ORDER — KETOROLAC TROMETHAMINE 30 MG/ML IJ SOLN
30.0000 mg | Freq: Once | INTRAMUSCULAR | Status: AC
  Administered 2015-10-24: 30 mg via INTRAVENOUS
  Filled 2015-10-24: qty 1

## 2015-10-24 MED ORDER — OXYCODONE-ACETAMINOPHEN 5-325 MG PO TABS: 2.0000 | ORAL_TABLET | ORAL | Status: DC | PRN

## 2015-10-24 MED ORDER — TAMSULOSIN HCL 0.4 MG PO CAPS
0.4000 mg | ORAL_CAPSULE | Freq: Every day | ORAL | Status: DC
Start: 2015-10-24 — End: 2015-10-30

## 2015-10-24 NOTE — ED Notes (Signed)
Per [facility]- Pt with sudden onset of RT side testicular pain, groin pain that radiated to rt flank area. Nausea with vomiting. Denies urinary problems. No injury. HX of KIdney stones

## 2015-10-24 NOTE — ED Provider Notes (Signed)
CSN: [id number]     Arrival date & time 10/24/15  1126 History   First MD Initiated Contact with Patient 10/24/15 1154     Chief Complaint  Patient presents with  . Testicle Pain  . Groin Pain  . Flank Pain     (Consider location/radiation/quality/duration/timing/severity/associated sxs/prior Treatment) Patient is a 45 y.o. male presenting with testicular pain, groin pain, and flank pain. The history is provided by the patient (Patient complains of right flank pain and right testicular pain became severe today).  Testicle Pain This is a new problem. The current episode started 6 to 12 hours ago. The problem occurs constantly. The problem has not changed since onset.Associated symptoms include abdominal pain. Pertinent negatives include no chest pain and no headaches. Nothing aggravates the symptoms. Nothing relieves the symptoms. He has tried nothing for the symptoms.  Groin Pain Associated symptoms include abdominal pain. Pertinent negatives include no chest pain and no headaches.  Flank Pain Associated symptoms include abdominal pain. Pertinent negatives include no chest pain and no headaches.    Past Medical History  Diagnosis Date  . Neuralgia   . Hypertension   . Kidney stones   . GERD (gastroesophageal reflux disease)    Past Surgical History  Procedure Laterality Date  . Back surgery    . Appendectomy    . Tonsillectomy    . Right thoracotomy for exposure for the c7-t8 diskectomy  02/06/2007    Dr Arlyce Dice  . Laparoscopic gastrotomy w/ repair of ulcer    . Hernia repair     Family History  Problem Relation Age of Onset  . Hypertension Mother   . Asthma Mother   . Cancer Paternal Uncle     intestinal  . Healthy Sister   . Healthy Brother   . Scoliosis Daughter   . Healthy Son   . Other Brother     tumors   Social History  Substance Use Topics  . Smoking status: Former Smoker    Quit date: 05/23/1997  . Smokeless tobacco: Current User  . Alcohol Use: Yes     Review of Systems  Constitutional: Negative for appetite change and fatigue.  HENT: Negative for congestion, ear discharge and sinus pressure.   Eyes: Negative for discharge.  Respiratory: Negative for cough.   Cardiovascular: Negative for chest pain.  Gastrointestinal: Positive for abdominal pain. Negative for diarrhea.  Genitourinary: Positive for flank pain and testicular pain. Negative for frequency and hematuria.  Musculoskeletal: Negative for back pain.  Skin: Negative for rash.  Neurological: Negative for seizures and headaches.  Psychiatric/Behavioral: Negative for hallucinations.      Allergies  Review of patient's allergies indicates no known allergies.  Home Medications   Prior to Admission medications   Medication Sig Start Date End Date Taking? Authorizing Provider  aspirin EC 81 MG tablet Take 81 mg by mouth daily.   Yes Historical Provider, MD  HYDROcodone-acetaminophen (NORCO) 10-325 MG tablet Take 1 tablet by mouth every 8 (eight) hours as needed. Patient taking differently: Take 1 tablet by mouth every 8 (eight) hours as needed for moderate pain.  09/23/15  Yes Brunetta Jeans, PA-C  lisinopril (PRINIVIL,ZESTRIL) 10 MG tablet Take 1 tablet (10 mg total) by mouth daily. 07/31/15  Yes Brunetta Jeans, PA-C  ondansetron (ZOFRAN ODT) 4 MG disintegrating tablet 4mg  ODT q4 hours prn nausea/vomit 10/24/15   Milton Ferguson, MD  oxyCODONE-acetaminophen (PERCOCET) 5-325 MG tablet Take 2 tablets by mouth every 4 (four) hours as needed. 10/24/15  Milton Ferguson, MD  tamsulosin (FLOMAX) 0.4 MG CAPS capsule Take 1 capsule (0.4 mg total) by mouth daily. 10/24/15   Milton Ferguson, MD   BP 123/92 mmHg  Pulse 102  Temp(Src) 98 F (36.7 C)  Resp 20  SpO2 97% Physical Exam  Constitutional: He is oriented to person, place, and time. He appears well-developed.  HENT:  Head: Normocephalic.  Eyes: Conjunctivae and EOM are normal. No scleral icterus.  Neck: Neck supple. No thyromegaly  present.  Cardiovascular: Normal rate and regular rhythm.  Exam reveals no gallop and no friction rub.   No murmur heard. Pulmonary/Chest: No stridor. He has no wheezes. He has no rales. He exhibits no tenderness.  Abdominal: He exhibits no distension. There is no tenderness. There is no rebound.  Genitourinary:  Tender right flank  Musculoskeletal: Normal range of motion. He exhibits no edema.  Lymphadenopathy:    He has no cervical adenopathy.  Neurological: He is oriented to person, place, and time. He exhibits normal muscle tone. Coordination normal.  Skin: No rash noted. No erythema.  Psychiatric: He has a normal mood and affect. His behavior is normal.    ED Course  Procedures (including critical care time) Labs Review Labs Reviewed  URINALYSIS, [place] (NOT AT [facility]) - Abnormal; Notable for the following:    Hgb urine dipstick LARGE (*)    All other components within normal limits  CBC WITH DIFFERENTIAL/PLATELET - Abnormal; Notable for the following:    MCHC 36.5 (*)    Monocytes Absolute 1.1 (*)    All other components within normal limits  COMPREHENSIVE METABOLIC PANEL - Abnormal; Notable for the following:    Glucose, Bld 105 (*)    All other components within normal limits  URINE MICROSCOPIC-ADD ON    Imaging Review Ct Renal Stone Study  10/24/2015  CLINICAL DATA:  Right side testicular pain, groin pain radiating to right flank. Nausea, vomiting. EXAM: CT ABDOMEN AND PELVIS WITHOUT CONTRAST TECHNIQUE: Multidetector CT imaging of the abdomen and pelvis was performed following the standard protocol without IV contrast. COMPARISON:  None. FINDINGS: Lower chest: Lung bases are clear. No effusions. Heart is normal size. Hepatobiliary: No focal hepatic abnormality. Gallbladder unremarkable. Pancreas: No focal abnormality or ductal dilatation. Spleen: No focal abnormality.  Normal size. Adrenals/Urinary Tract: 3 mm proximal right ureteral stone with mild right  hydronephrosis. Punctate nonobstructing stone in the midpole of the right kidney. No stones or hydronephrosis on the left. Urinary bladder and adrenal glands have an unremarkable unenhanced appearance. There are several small low-density areas within both kidneys, likely small cysts. Stomach/Bowel: Stomach, large and small bowel grossly unremarkable. Vascular/Lymphatic: No evidence of aneurysm or adenopathy. Reproductive: No visible focal abnormality Other: No free fluid or free air. Musculoskeletal: No acute bony abnormality or focal bone lesion. IMPRESSION: 3 mm proximal right ureteral stone with mild right hydronephrosis. Right mid pole punctate nephrolithiasis. Low-density areas in the kidneys bilaterally, presumably cysts although these cannot be characterized without intravenous contrast. Electronically Signed   By: Rolm Baptise M.D.   On: 10/24/2015 12:59   I have personally reviewed and evaluated these images and lab results as part of my medical decision-making.   EKG Interpretation None      MDM   Final diagnoses:  Kidney stone    CT scan shows a 3 mm stone right ureter.. Patient sent home with Percocet Zofran Flomax and will follow-up with urology    Milton Ferguson, MD 10/24/15 1425

## 2015-10-24 NOTE — ED Notes (Signed)
Bed: WA08 Expected date:  Expected time:  Means of arrival:  Comments: EMS- 44yo abd pain

## 2015-10-24 NOTE — Discharge Instructions (Signed)
Follow-up with [facility]. °

## 2015-10-27 ENCOUNTER — Other Ambulatory Visit: Payer: Self-pay | Admitting: Physician Assistant

## 2015-10-27 ENCOUNTER — Encounter: Payer: Self-pay | Admitting: Physician Assistant

## 2015-10-27 DIAGNOSIS — R0789 Other chest pain: Principal | ICD-10-CM

## 2015-10-27 DIAGNOSIS — G8929 Other chronic pain: Secondary | ICD-10-CM

## 2015-10-27 MED ORDER — HYDROCODONE-ACETAMINOPHEN 10-325 MG PO TABS: 1.0000 | ORAL_TABLET | Freq: Three times a day (TID) | ORAL | Status: DC | PRN

## 2015-10-27 NOTE — Telephone Encounter (Signed)
Refill granted. Will print [place] for signature on pickup. Needs to leave UDS sample.

## 2015-10-29 MED FILL — LISINOPRIL 10 MG TABLET: 10 | 30 days supply | Qty: 30 | Fill #3

## 2015-10-30 ENCOUNTER — Encounter: Payer: Self-pay | Admitting: Physician Assistant

## 2015-10-30 ENCOUNTER — Ambulatory Visit (INDEPENDENT_AMBULATORY_CARE_PROVIDER_SITE_OTHER): Payer: Self-pay | Admitting: Physician Assistant

## 2015-10-30 VITALS — BP 113/82 | HR 83 | Temp 98.1°F | Resp 16 | Ht 71.0 in | Wt 195.0 lb

## 2015-10-30 DIAGNOSIS — N201 Calculus of ureter: Secondary | ICD-10-CM

## 2015-10-30 MED ORDER — ONDANSETRON HCL 4 MG PO TABS: 4.0000 mg | ORAL_TABLET | Freq: Three times a day (TID) | ORAL | Status: DC | PRN

## 2015-10-30 MED ORDER — TAMSULOSIN HCL 0.4 MG PO CAPS: 0.4000 mg | ORAL_CAPSULE | Freq: Every day | ORAL | Status: DC

## 2015-10-30 MED ORDER — OXYCODONE-ACETAMINOPHEN 5-325 MG PO TABS: 1.0000 | ORAL_TABLET | Freq: Four times a day (QID) | ORAL | Status: DC | PRN

## 2015-10-30 MED FILL — TAMSULOSIN HCL 0.4 MG CAP: 0.4 | 20 days supply | Qty: 20 | Fill #0

## 2015-10-30 MED FILL — ONDANSETRON HCL 4 MG TABLET: 4 | 6 days supply | Qty: 20 | Fill #0

## 2015-10-30 MED FILL — HYDROCODON-APAP 10-325: 10-325 | 10 days supply | Qty: 30 | Fill #0

## 2015-10-30 MED FILL — OXYCODONE/APAP 5-325: 5-325 | 4 days supply | Qty: 30 | Fill #0

## 2015-10-30 NOTE — Progress Notes (Signed)
Pre visit review using our clinic review tool, if applicable. No additional management support is needed unless otherwise documented below in the visit note/SLS  

## 2015-10-30 NOTE — Progress Notes (Signed)
Patient presents to clinic today for ER follow-up of nephrolithiasis. Patient presented to ER on 10/24/15 with complaints of R flank pain, nausea and vomiting. Workup included CT scan which revealed a 3 mm proximal R ureter stone with mild hydronephrosis. Was discharged with Percocet,  Flomax and Zofran. Has been taking Flomax daily. Endorses he is out of medication. Is taking Percocet only when needed with some relief in pain. Endorses intermittent continued R flank pain. Endorses hematuria without urgency or frequency.  Denies prior history of kidney stone.    Past Medical History  Diagnosis Date  . Neuralgia   . Hypertension   . Kidney stones   . GERD (gastroesophageal reflux disease)     Current Outpatient Prescriptions on File Prior to Visit  Medication Sig Dispense Refill  . aspirin EC 81 MG tablet Take 81 mg by mouth daily.    Marland Kitchen lisinopril (PRINIVIL,ZESTRIL) 10 MG tablet Take 1 tablet (10 mg total) by mouth daily. 30 tablet 3  . HYDROcodone-acetaminophen (NORCO) 10-325 MG tablet Take 1 tablet by mouth every 8 (eight) hours as needed. (Patient not taking: Reported on 10/30/2015) 30 tablet 0  . ondansetron (ZOFRAN ODT) 4 MG disintegrating tablet 61m ODT q4 hours prn nausea/vomit (Patient not taking: Reported on 10/30/2015) 10 tablet 0  . oxyCODONE-acetaminophen (PERCOCET) 5-325 MG tablet Take 2 tablets by mouth every 4 (four) hours as needed. (Patient not taking: Reported on 10/30/2015) 20 tablet 0  . tamsulosin (FLOMAX) 0.4 MG CAPS capsule Take 1 capsule (0.4 mg total) by mouth daily. (Patient not taking: Reported on 10/30/2015) 5 capsule 0  . [DISCONTINUED] Gabapentin, PHN, 300 MG TABS Take 1 capsule by mouth 3 (three) times daily. 300 tablet 3   No current facility-administered medications on file prior to visit.    No Known Allergies  Family History  Problem Relation Age of Onset  . Hypertension Mother   . Asthma Mother   . Cancer Paternal Uncle     intestinal  . Healthy Sister     . Healthy Brother   . Scoliosis Daughter   . Healthy Son   . Other Brother     tumors    Social History   Social History  . Marital Status: Divorced    Spouse Name: N/A  . Number of Children: N/A  . Years of Education: N/A   Social History Main Topics  . Smoking status: Former Smoker    Quit date: 05/23/1997  . Smokeless tobacco: Current User  . Alcohol Use: Yes  . Drug Use: No  . Sexual Activity: Not Asked   Other Topics Concern  . None   Social History Narrative   Review of Systems - See HPI.  All other ROS are negative.  BP 113/82 mmHg  Pulse 83  Temp(Src) 98.1 F (36.7 C) (Oral)  Resp 16  Ht 5' 11"  (1.803 m)  Wt 195 lb (88.451 kg)  BMI 27.21 kg/m2  SpO2 99%  Physical Exam  Constitutional: He is oriented to person, place, and time and well-developed, well-nourished, and in no distress.  HENT:  Head: Normocephalic.  Eyes: Conjunctivae are normal.  Cardiovascular: Normal rate, regular rhythm, normal heart sounds and intact distal pulses.   Pulmonary/Chest: Effort normal and breath sounds normal. No respiratory distress. He has no wheezes. He has no rales. He exhibits no tenderness.  Neurological: He is alert and oriented to person, place, and time.  Skin: Skin is warm and dry. No rash noted.  Psychiatric: Affect normal.  Vitals reviewed.  Recent Results (from the past 2160 hour(s))  Exercise Tolerance Test     Status: None   Collection Time: 08/18/15 10:38 AM  Result Value Ref Range   Rest HR 87 bpm   Rest BP 111/88 mmHg   Exercise duration (min) 7 min   Exercise duration (sec) 18 sec   Estimated workload 8.9 METS   Peak HR 164 BPM   Peak BP  mmHg   MPHR 176 bpm   Percent HR 93 %   RPE 18    Phase 1 name PRETEST    Stage 1 Name SITTING    Stage 1 Time 00:01:17    Stage 1 Speed 0.0 mph   Stage 1 Grade 0.0 %   Stage 1 HR 101 bpm   Stage 1 SBP 111 mmHg   Stage 1 DBP 88 mmHg   Phase 2 Name PRETEST    Stage 2 Name WARM-UP    Stage 2 Time  00:00:06    Stage 2 Speed 1.0 mph   Stage 2 Grade 0.0 %   Stage 2 HR 100 bpm   Phase 3 Name Exercise    Stage 3 Name STAGE 1    Stage 3 Attribute Baseline    Stage 3 Time 00:00:00    Stage 3 Speed 1.0 mph   Stage 3 Grade 0.0 %   Stage 3 HR 99 bpm   Phase 4 Name Exercise    Stage 4 Name STAGE 1    Stage 4 Time 00:03:00    Stage 4 Speed 1.7 mph   Stage 4 Grade 10.0 %   Stage 4 HR 130 bpm   Stage 4 SBP 128 mmHg   Stage 4 DBP 83 mmHg   Phase 5 Name Exercise    Stage 5 Name STAGE 2    Stage 5 Time 00:03:00    Stage 5 Speed 2.5 mph   Stage 5 Grade 12.0 %   Stage 5 HR 150 bpm   Phase 6 Name Exercise    Stage 6 Name STAGE 3    Stage 6 Attribute Peak    Stage 6 Time 00:01:18    Stage 6 Speed 3.4 mph   Stage 6 Grade 14.0 %   Stage 6 HR 164 bpm   Phase 7 Name Recovery    Stage 7 Attribute Recovery 81mn    Stage 7 Time 00:01:01    Stage 7 Speed 0.0 mph   Stage 7 Grade 0.0 %   Stage 7 HR 137 bpm   Phase 8 Name Recovery    Stage 8 Time 00:05:18    Stage 8 Speed 0.0 mph   Stage 8 Grade 0.0 %   Stage 8 HR 102 bpm   Stage 8 SBP 142 mmHg   Stage 8 DBP 96 mmHg   Percent of predicted max HR 93 %  Urinalysis, Routine w reflex microscopic- may I&O cath if menses     Status: Abnormal   Collection Time: 10/24/15 11:40 AM  Result Value Ref Range   Color, Urine YELLOW YELLOW   APPearance CLEAR CLEAR   Specific Gravity, Urine 1.007 1.005 - 1.030   pH 8.0 5.0 - 8.0   Glucose, UA NEGATIVE NEGATIVE mg/dL   Hgb urine dipstick LARGE (A) NEGATIVE   Bilirubin Urine NEGATIVE NEGATIVE   Ketones, ur NEGATIVE NEGATIVE mg/dL   Protein, ur NEGATIVE NEGATIVE mg/dL   Nitrite NEGATIVE NEGATIVE   Leukocytes, UA NEGATIVE NEGATIVE  Urine microscopic-add on  Status: None   Collection Time: 10/24/15 11:40 AM  Result Value Ref Range   Squamous Epithelial / LPF NONE SEEN NONE SEEN   WBC, UA 0-5 0 - 5 WBC/hpf   RBC / HPF 6-30 0 - 5 RBC/hpf   Bacteria, UA NONE SEEN NONE SEEN  CBC with  Differential/Platelet     Status: Abnormal   Collection Time: 10/24/15 12:28 PM  Result Value Ref Range   WBC 9.9 4.0 - 10.5 K/uL   RBC 5.28 4.22 - 5.81 MIL/uL   Hemoglobin 16.6 13.0 - 17.0 g/dL   HCT 45.5 39.0 - 52.0 %   MCV 86.2 78.0 - 100.0 fL   MCH 31.4 26.0 - 34.0 pg   MCHC 36.5 (H) 30.0 - 36.0 g/dL   RDW 12.6 11.5 - 15.5 %   Platelets 290 150 - 400 K/uL   Neutrophils Relative % 71 %   Neutro Abs 7.0 1.7 - 7.7 K/uL   Lymphocytes Relative 17 %   Lymphs Abs 1.6 0.7 - 4.0 K/uL   Monocytes Relative 11 %   Monocytes Absolute 1.1 (H) 0.1 - 1.0 K/uL   Eosinophils Relative 1 %   Eosinophils Absolute 0.1 0.0 - 0.7 K/uL   Basophils Relative 0 %   Basophils Absolute 0.0 0.0 - 0.1 K/uL  Comprehensive metabolic panel     Status: Abnormal   Collection Time: 10/24/15 12:28 PM  Result Value Ref Range   Sodium 139 135 - 145 mmol/L   Potassium 4.0 3.5 - 5.1 mmol/L   Chloride 103 101 - 111 mmol/L   CO2 25 22 - 32 mmol/L   Glucose, Bld 105 (H) 65 - 99 mg/dL   BUN 11 6 - 20 mg/dL   Creatinine, Ser 1.01 0.61 - 1.24 mg/dL   Calcium 9.9 8.9 - 10.3 mg/dL   Total Protein 7.8 6.5 - 8.1 g/dL   Albumin 4.6 3.5 - 5.0 g/dL   AST 23 15 - 41 U/L   ALT 30 17 - 63 U/L   Alkaline Phosphatase 63 38 - 126 U/L   Total Bilirubin 1.0 0.3 - 1.2 mg/dL   GFR calc non Af Amer >60 >60 mL/min   GFR calc Af Amer >60 >60 mL/min    Comment: (NOTE) The eGFR has been calculated using the CKD EPI equation. This calculation has not been validated in all clinical situations. eGFR's persistently <60 mL/min signify possible Chronic Kidney Disease.    Anion gap 11 5 - 15   Assessment/Plan: 1. Right ureteral stone Has not passed. No pain at present but having intermittently. Medications refilled. Supportive measures reviewed. Strainer provided to patient to collect stone. Referral placed to Urology for further management in case stone is not passing.  - Ambulatory referral to Urology - tamsulosin (FLOMAX) 0.4 MG  CAPS capsule; Take 1 capsule (0.4 mg total) by mouth daily.  Dispense: 20 capsule; Refill: 0 - oxyCODONE-acetaminophen (PERCOCET) 5-325 MG tablet; Take 1-2 tablets by mouth every 6 (six) hours as needed.  Dispense: 30 tablet; Refill: 0   Leeanne Rio, [place]

## 2015-10-30 NOTE — Patient Instructions (Signed)
Please stay well hydrated and continue the Flomax. Use pain medication as directed only when needed for significant pain. Try to fill the new prescription for the Zofran. It is a generic tablet and will hopefully be cheaper.  Keep straining your urine.  You will be contacted for an appointment with Urology. If you pass stone before that appointment then ok to cancel.  If you have an acute worsening of symptoms, please go to the ER.

## 2015-11-16 ENCOUNTER — Encounter: Payer: Self-pay | Admitting: Physician Assistant

## 2015-11-27 ENCOUNTER — Other Ambulatory Visit: Payer: Self-pay | Admitting: Physician Assistant

## 2015-11-27 MED FILL — LISINOPRIL 10 MG TABLET: 10 | 30 days supply | Qty: 30 | Fill #0

## 2015-11-27 NOTE — Telephone Encounter (Signed)
Rx request to pharmacy/SLS  

## 2015-12-03 ENCOUNTER — Telehealth: Payer: Self-pay | Admitting: Physician Assistant

## 2015-12-03 DIAGNOSIS — G8929 Other chronic pain: Secondary | ICD-10-CM

## 2015-12-03 DIAGNOSIS — R0789 Other chest pain: Principal | ICD-10-CM

## 2015-12-03 NOTE — Telephone Encounter (Signed)
Pt is requesting refill on Hydrocodone.  Last OV: 08/28/2015 Last Fill: 10/27/2015 #30 and 0RF UDS: 10/27/2015 Pending  Please advise.

## 2015-12-04 MED ORDER — HYDROCODONE-ACETAMINOPHEN 10-325 MG PO TABS: 1.0000 | ORAL_TABLET | Freq: Three times a day (TID) | ORAL | Status: DC | PRN

## 2015-12-04 MED FILL — HYDROCODON-APAP 10-325: 10-325 | 10 days supply | Qty: 30 | Fill #0

## 2015-12-04 NOTE — Telephone Encounter (Signed)
Refill printed. Call patient for pickup.

## 2015-12-04 NOTE — Telephone Encounter (Signed)
LMOM with contact name and number RE: requested Rx ready for p/u, also informed via My Chart/SLS 07/14

## 2015-12-30 MED FILL — LISINOPRIL 10 MG TABLET: 10 | 30 days supply | Qty: 30 | Fill #1

## 2016-02-08 MED FILL — LISINOPRIL 10 MG TABLET: 10 | 30 days supply | Qty: 30 | Fill #2

## 2016-02-15 ENCOUNTER — Other Ambulatory Visit: Payer: Self-pay | Admitting: Physician Assistant

## 2016-02-15 DIAGNOSIS — R0789 Other chest pain: Principal | ICD-10-CM

## 2016-02-15 DIAGNOSIS — G8929 Other chronic pain: Secondary | ICD-10-CM

## 2016-02-16 ENCOUNTER — Other Ambulatory Visit: Payer: Self-pay | Admitting: Physician Assistant

## 2016-02-16 MED ORDER — HYDROCODONE-ACETAMINOPHEN 10-325 MG PO TABS: 1.0000 | ORAL_TABLET | Freq: Three times a day (TID) | ORAL | 0 refills | Status: DC | PRN

## 2016-02-16 MED FILL — HYDROCODON-APAP 10-325: 10-325 | 10 days supply | Qty: 30 | Fill #0

## 2016-02-16 NOTE — Telephone Encounter (Signed)
Please inform patient that refill is ready for pickup.

## 2016-02-16 NOTE — Telephone Encounter (Signed)
Left message on VM informing patient that refill is ready for pick up

## 2016-03-17 MED FILL — LISINOPRIL 10 MG TABLET: 10 | 30 days supply | Qty: 30 | Fill #3

## 2016-04-17 ENCOUNTER — Encounter: Payer: Self-pay | Admitting: Physician Assistant

## 2016-04-18 ENCOUNTER — Other Ambulatory Visit: Payer: Self-pay | Admitting: Emergency Medicine

## 2016-04-18 DIAGNOSIS — I10 Essential (primary) hypertension: Secondary | ICD-10-CM

## 2016-04-18 MED ORDER — LISINOPRIL 10 MG PO TABS: 10.0000 mg | ORAL_TABLET | Freq: Every day | ORAL | 3 refills | Status: DC

## 2016-06-14 ENCOUNTER — Encounter (HOSPITAL_COMMUNITY): Payer: Self-pay | Admitting: Emergency Medicine

## 2016-06-14 ENCOUNTER — Emergency Department (HOSPITAL_COMMUNITY)
Admission: EM | Admit: 2016-06-14 | Discharge: 2016-06-14 | Disposition: A | Discharge status: Home / Self Care | Payer: Medicaid Other | Attending: Emergency Medicine | Admitting: Emergency Medicine

## 2016-06-14 ENCOUNTER — Emergency Department (HOSPITAL_COMMUNITY): Payer: Medicaid Other

## 2016-06-14 DIAGNOSIS — R05 Cough: Secondary | ICD-10-CM | POA: Diagnosis present

## 2016-06-14 DIAGNOSIS — R0981 Nasal congestion: Secondary | ICD-10-CM | POA: Diagnosis not present

## 2016-06-14 DIAGNOSIS — R112 Nausea with vomiting, unspecified: Secondary | ICD-10-CM | POA: Diagnosis not present

## 2016-06-14 DIAGNOSIS — Z87891 Personal history of nicotine dependence: Secondary | ICD-10-CM | POA: Diagnosis not present

## 2016-06-14 DIAGNOSIS — R197 Diarrhea, unspecified: Secondary | ICD-10-CM | POA: Insufficient documentation

## 2016-06-14 DIAGNOSIS — Z79899 Other long term (current) drug therapy: Secondary | ICD-10-CM | POA: Diagnosis not present

## 2016-06-14 DIAGNOSIS — R6889 Other general symptoms and signs: Secondary | ICD-10-CM

## 2016-06-14 DIAGNOSIS — I10 Essential (primary) hypertension: Secondary | ICD-10-CM | POA: Diagnosis not present

## 2016-06-14 DIAGNOSIS — R509 Fever, unspecified: Secondary | ICD-10-CM

## 2016-06-14 DIAGNOSIS — R059 Cough, unspecified: Secondary | ICD-10-CM

## 2016-06-14 DIAGNOSIS — R52 Pain, unspecified: Secondary | ICD-10-CM | POA: Diagnosis not present

## 2016-06-14 DIAGNOSIS — Z7982 Long term (current) use of aspirin: Secondary | ICD-10-CM | POA: Diagnosis not present

## 2016-06-14 DIAGNOSIS — R6883 Chills (without fever): Secondary | ICD-10-CM

## 2016-06-14 LAB — BASIC METABOLIC PANEL
ANION GAP: 9 (ref 5–15)
BUN: 11 mg/dL (ref 6–20)
CHLORIDE: 103 mmol/L (ref 101–111)
CO2: 23 mmol/L (ref 22–32)
Calcium: 9.2 mg/dL (ref 8.9–10.3)
Creatinine, Ser: 1.19 mg/dL (ref 0.61–1.24)
GFR calc non Af Amer: >60 mL/min (ref >60)
Glucose, Bld: 108 mg/dL — ABNORMAL HIGH (ref 65–99)
Potassium: 3.4 mmol/L — ABNORMAL LOW (ref 3.5–5.1)
Sodium: 135 mmol/L (ref 135–145)

## 2016-06-14 LAB — CBC
HEMATOCRIT: 42.6 % (ref 39.0–52.0)
HEMOGLOBIN: 15.1 g/dL (ref 13.0–17.0)
MCH: 30.3 pg (ref 26.0–34.0)
MCHC: 35.4 g/dL (ref 30.0–36.0)
MCV: 85.5 fL (ref 78.0–100.0)
Platelets: 161 10*3/uL (ref 150–400)
RBC: 4.98 MIL/uL (ref 4.22–5.81)
RDW: 13.7 % (ref 11.5–15.5)
WBC: 4.1 10*3/uL (ref 4.0–10.5)

## 2016-06-14 MED ORDER — BENZONATATE 100 MG PO CAPS: 100.0000 mg | ORAL_CAPSULE | Freq: Three times a day (TID) | ORAL | 0 refills | Status: DC | PRN

## 2016-06-14 MED ORDER — ONDANSETRON 4 MG PO TBDP
ORAL_TABLET | ORAL | Status: AC
  Filled 2016-06-14: qty 1

## 2016-06-14 MED ORDER — ONDANSETRON 4 MG PO TBDP: 4.0000 mg | ORAL_TABLET | Freq: Three times a day (TID) | ORAL | 0 refills | Status: DC | PRN

## 2016-06-14 MED ORDER — IBUPROFEN 400 MG PO TABS
ORAL_TABLET | ORAL | Status: AC
  Filled 2016-06-14: qty 1

## 2016-06-14 MED ORDER — BENZONATATE 100 MG PO CAPS
100.0000 mg | ORAL_CAPSULE | Freq: Once | ORAL | Status: AC
  Administered 2016-06-14: 100 mg via ORAL
  Filled 2016-06-14: qty 1

## 2016-06-14 MED ORDER — SODIUM CHLORIDE 0.9 % IV BOLUS (SEPSIS)
1000.0000 mL | Freq: Once | INTRAVENOUS | Status: AC
  Administered 2016-06-14: 1000 mL via INTRAVENOUS

## 2016-06-14 MED ORDER — ACETAMINOPHEN 325 MG PO TABS
ORAL_TABLET | ORAL | Status: AC
  Filled 2016-06-14: qty 2

## 2016-06-14 MED ORDER — ACETAMINOPHEN 325 MG PO TABS
650.0000 mg | ORAL_TABLET | Freq: Once | ORAL | Status: AC
  Administered 2016-06-14: 650 mg via ORAL

## 2016-06-14 MED ORDER — IBUPROFEN 400 MG PO TABS
400.0000 mg | ORAL_TABLET | Freq: Once | ORAL | Status: AC
  Administered 2016-06-14: 400 mg via ORAL

## 2016-06-14 MED ORDER — ONDANSETRON HCL 4 MG PO TABS
4.0000 mg | ORAL_TABLET | Freq: Once | ORAL | Status: AC
  Administered 2016-06-14: 4 mg via ORAL

## 2016-06-14 MED ORDER — ONDANSETRON 4 MG PO TBDP: 4.0000 mg | ORAL_TABLET | Freq: Once | ORAL | Status: DC

## 2016-06-14 NOTE — ED Notes (Signed)
Pt sts son and wife tested positive for Type A flu.

## 2016-06-14 NOTE — ED Triage Notes (Signed)
Pt presents to ED for assessment of flu like symptoms, including fever/chills, body aches, headache, congestion, coughing, nausea, vomiting and diarrhea.  Pt sts symptoms started Friday.

## 2016-06-14 NOTE — ED Provider Notes (Signed)
[facility] DEPT Provider Note  CSN: [id number] Arrival date & time: 06/14/16  0433  History   Chief Complaint Chief Complaint  Patient presents with  . flu like symptoms   HPI Kevin Jackson is a 46 y.o. male.  The history is provided by the patient and medical records. No language interpreter was used.  Illness  This is a new problem. The current episode started more than 2 days ago. The problem occurs constantly. The problem has been gradually worsening. Associated symptoms include headaches. Pertinent negatives include no chest pain, no abdominal pain and no shortness of breath. Nothing aggravates the symptoms. Nothing relieves the symptoms.    Past Medical History:  Diagnosis Date  . GERD (gastroesophageal reflux disease)   . Hypertension   . Kidney stones   . Neuralgia    Patient Active Problem List   Diagnosis Date Noted  . Chronic chest wall pain 08/28/2015  . 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 Numerous moles 01/19/2012  . Hypertension 03/17/2011  . CHEST PAIN 05/06/2010  . BACK PAIN, LUMBAR 08/06/2009   Past Surgical History:  Procedure Laterality Date  . APPENDECTOMY    . BACK SURGERY    . HERNIA REPAIR    . LAPAROSCOPIC GASTROTOMY W/ REPAIR OF ULCER    . right thoracotomy for exposure for the C7-T8 diskectomy  02/06/2007   Dr Arlyce Dice  . TONSILLECTOMY      Home Medications    Prior to Admission medications   Medication Sig Start Date End Date Taking? Authorizing Provider  aspirin EC 81 MG tablet Take 81 mg by mouth daily.    Historical Provider, MD  HYDROcodone-acetaminophen (NORCO) 10-325 MG tablet Take 1 tablet by mouth every 8 (eight) hours as needed. 02/16/16   Brunetta Jeans, PA-C  lisinopril (PRINIVIL,ZESTRIL) 10 MG tablet Take 1 tablet (10 mg total) by mouth daily. 04/18/16   Brunetta Jeans, PA-C  ondansetron (ZOFRAN) 4 MG tablet Take 1 tablet (4 mg total) by mouth every 8 (eight) hours as needed for nausea or vomiting. 10/30/15    Brunetta Jeans, PA-C  tamsulosin (FLOMAX) 0.4 MG CAPS capsule Take 1 capsule (0.4 mg total) by mouth daily. 10/30/15   Brunetta Jeans, PA-C   Family History Family History  Problem Relation Age of Onset  . Hypertension Mother   . Asthma Mother   . Cancer Paternal Uncle     intestinal  . Healthy Sister   . Healthy Brother   . Scoliosis Daughter   . Healthy Son   . Other Brother     tumors   Social History Social History  Substance Use Topics  . Smoking status: Former Smoker    Quit date: 05/23/1997  . Smokeless tobacco: Current User  . Alcohol use Yes    Allergies   Patient has no known allergies.  Review of Systems Review of Systems  Constitutional: Positive for chills and fever.  HENT: Positive for congestion.   Respiratory: Positive for cough. Negative for shortness of breath.   Cardiovascular: Negative for chest pain.  Gastrointestinal: Positive for diarrhea, nausea and vomiting. Negative for abdominal pain.  Musculoskeletal: Positive for myalgias.  Neurological: Positive for headaches.  All other systems reviewed and are negative.   Physical Exam Updated Vital Signs BP 109/83   Pulse 86   Temp 101.4 F (38.6 C) (Oral)   Resp 16   Ht 5\' 10"  (1.778 m)   Wt 88 kg   SpO2 94%   BMI 27.84 kg/m  Physical Exam  Constitutional: He is oriented to person, place, and time. He appears well-developed and well-nourished. No distress.  HENT:  Head: Normocephalic and atraumatic.  Eyes: EOM are normal. Pupils are equal, round, and reactive to light.  Neck: Normal range of motion. Neck supple.  Cardiovascular: Normal rate, regular rhythm and normal heart sounds.   Pulmonary/Chest: Effort normal and breath sounds normal.  Abdominal: Soft. Bowel sounds are normal. He exhibits no distension. There is no tenderness.  Musculoskeletal: Normal range of motion.  Neurological: He is alert and oriented to person, place, and time.  Skin: Skin is warm and dry. Capillary refill  takes less than 2 seconds. He is not diaphoretic.  Nursing note and vitals reviewed.   ED Treatments / Results  Labs (all labs ordered are listed, but only abnormal results are displayed) Labs Reviewed  BASIC METABOLIC PANEL - Abnormal; Notable for the following:       Result Value   Potassium 3.4 (*)    Glucose, Bld 108 (*)    All other components within normal limits  CBC   EKG  EKG Interpretation None      Radiology Dg Chest 2 View  Result Date: 06/14/2016 CLINICAL DATA:  Initial evaluation for acute cough, fever, body aches. EXAM: CHEST  2 VIEW COMPARISON:  Prior radiograph from 05/21/2013. FINDINGS: The cardiac and mediastinal silhouettes are stable in size and contour, and remain within normal limits. The lungs are normally inflated. No airspace consolidation, pleural effusion, or pulmonary edema is identified. There is no pneumothorax. No acute osseous abnormality identified. IMPRESSION: No radiographic evidence for active cardiopulmonary disease. Electronically Signed   By: Jeannine Boga M.D.   On: 06/14/2016 05:26   Procedures Procedures (including critical care time)  Medications Ordered in ED Medications  ibuprofen (ADVIL,MOTRIN) 400 MG tablet (not administered)  acetaminophen (TYLENOL) 325 MG tablet (not administered)  ondansetron (ZOFRAN-ODT) 4 MG disintegrating tablet (not administered)  ondansetron (ZOFRAN-ODT) disintegrating tablet 4 mg (not administered)  ondansetron (ZOFRAN-ODT) 4 MG disintegrating tablet (not administered)  sodium chloride 0.9 % bolus 1,000 mL (not administered)  benzonatate (TESSALON) capsule 100 mg (not administered)  acetaminophen (TYLENOL) tablet 650 mg (650 mg Oral Given 06/14/16 0445)  ibuprofen (ADVIL,MOTRIN) tablet 400 mg (400 mg Oral Given 06/14/16 0445)  ondansetron (ZOFRAN) tablet 4 mg (4 mg Oral Given 06/14/16 0446)    Initial Impression / Assessment and Plan / ED Course  I have reviewed the triage vital signs and the  nursing notes.  46 y.o. male with above stated PMHx, HPI, and physical. PMHx of GERD, HTN, & nephrolithiasis. Symptom onset x4-5 days ago. Fever, chills, body aches, HA, congestion, cough, & N/V/D. Sick contact at home with confirmed Influenza A (90 year old son & wife)  BMP unremarkable. CBC without acute anemia or leukocytosis. CXR without evidence of acute cardiopulmonary disease, specifically no PNA. Pt given IVF's, Ibuprofen, Tylenol, zofran, & Tessalon Perles. No indication for treatment 2/2 age and lack of significant co-morbidities - will forgo influenza testing.  Laboratory and imaging results were personally reviewed by myself and used in the medical decision making of this patient's treatment and disposition.  Pt discharged home in stable condition. Strict ED return precautions dicussed. Pt understands and agrees with the plan and has no further questions or concerns.   Pt care discussed with and followed by my attending, Dr. Carmin Muskrat  Mayer Camel, MD Pager [phone]  Final Clinical Impressions(s) / ED Diagnoses   Final diagnoses:  Cough  Fever in adult  Nasal congestion  Chills  Body aches  Nausea vomiting and diarrhea  Flu-like symptoms   New Prescriptions New Prescriptions   No medications on file     Mayer Camel, MD 06/14/16 1157    Carmin Muskrat, MD 06/15/16 1726

## 2016-06-17 ENCOUNTER — Encounter (HOSPITAL_BASED_OUTPATIENT_CLINIC_OR_DEPARTMENT_OTHER): Payer: Self-pay | Admitting: *Deleted

## 2016-06-17 ENCOUNTER — Emergency Department (HOSPITAL_BASED_OUTPATIENT_CLINIC_OR_DEPARTMENT_OTHER): Payer: Medicaid Other

## 2016-06-17 ENCOUNTER — Emergency Department (HOSPITAL_BASED_OUTPATIENT_CLINIC_OR_DEPARTMENT_OTHER)
Admission: EM | Admit: 2016-06-17 | Discharge: 2016-06-17 | Disposition: A | Discharge status: Home / Self Care | Payer: Medicaid Other | Attending: Emergency Medicine | Admitting: Emergency Medicine

## 2016-06-17 DIAGNOSIS — K2960 Other gastritis without bleeding: Secondary | ICD-10-CM

## 2016-06-17 DIAGNOSIS — J189 Pneumonia, unspecified organism: Secondary | ICD-10-CM | POA: Diagnosis not present

## 2016-06-17 DIAGNOSIS — I10 Essential (primary) hypertension: Secondary | ICD-10-CM | POA: Insufficient documentation

## 2016-06-17 DIAGNOSIS — F17290 Nicotine dependence, other tobacco product, uncomplicated: Secondary | ICD-10-CM | POA: Insufficient documentation

## 2016-06-17 DIAGNOSIS — T39395A Adverse effect of other nonsteroidal anti-inflammatory drugs [NSAID], initial encounter: Secondary | ICD-10-CM | POA: Insufficient documentation

## 2016-06-17 DIAGNOSIS — R05 Cough: Secondary | ICD-10-CM | POA: Diagnosis present

## 2016-06-17 DIAGNOSIS — Z7982 Long term (current) use of aspirin: Secondary | ICD-10-CM | POA: Diagnosis not present

## 2016-06-17 DIAGNOSIS — Z79899 Other long term (current) drug therapy: Secondary | ICD-10-CM | POA: Diagnosis not present

## 2016-06-17 DIAGNOSIS — R042 Hemoptysis: Secondary | ICD-10-CM

## 2016-06-17 DIAGNOSIS — K2970 Gastritis, unspecified, without bleeding: Secondary | ICD-10-CM | POA: Diagnosis not present

## 2016-06-17 LAB — CBC WITH DIFFERENTIAL/PLATELET
BASOS ABS: 0 10*3/uL (ref 0.0–0.1)
Basophils Relative: 0 %
Eosinophils Absolute: 0 10*3/uL (ref 0.0–0.7)
Eosinophils Relative: 0 %
HCT: 40.1 % (ref 39.0–52.0)
HEMOGLOBIN: 14.3 g/dL (ref 13.0–17.0)
LYMPHS ABS: 1.6 10*3/uL (ref 0.7–4.0)
LYMPHS PCT: 31 %
MCH: 30.4 pg (ref 26.0–34.0)
MCHC: 35.7 g/dL (ref 30.0–36.0)
MCV: 85.3 fL (ref 78.0–100.0)
MONOS PCT: 12 %
Monocytes Absolute: 0.6 10*3/uL (ref 0.1–1.0)
NEUTROS PCT: 57 %
Neutro Abs: 3.1 10*3/uL (ref 1.7–7.7)
Platelets: 174 10*3/uL (ref 150–400)
RBC: 4.7 MIL/uL (ref 4.22–5.81)
RDW: 13.6 % (ref 11.5–15.5)
WBC: 5.3 10*3/uL (ref 4.0–10.5)

## 2016-06-17 LAB — COMPREHENSIVE METABOLIC PANEL
ALBUMIN: 3.9 g/dL (ref 3.5–5.0)
ALK PHOS: 56 U/L (ref 38–126)
ALT: 91 U/L — ABNORMAL HIGH (ref 17–63)
ANION GAP: 10 (ref 5–15)
AST: 86 U/L — AB (ref 15–41)
BUN: 10 mg/dL (ref 6–20)
CALCIUM: 8.9 mg/dL (ref 8.9–10.3)
CO2: 27 mmol/L (ref 22–32)
Chloride: 102 mmol/L (ref 101–111)
Creatinine, Ser: 0.96 mg/dL (ref 0.61–1.24)
GFR calc Af Amer: >60 mL/min (ref >60)
GFR calc non Af Amer: >60 mL/min (ref >60)
GLUCOSE: 93 mg/dL (ref 65–99)
POTASSIUM: 3.6 mmol/L (ref 3.5–5.1)
SODIUM: 139 mmol/L (ref 135–145)
Total Bilirubin: 1.1 mg/dL (ref 0.3–1.2)
Total Protein: 7 g/dL (ref 6.5–8.1)

## 2016-06-17 LAB — OCCULT BLOOD X 1 CARD TO LAB, STOOL: Fecal Occult Bld: NEGATIVE

## 2016-06-17 LAB — LIPASE, BLOOD: Lipase: 19 U/L (ref 11–51)

## 2016-06-17 MED ORDER — SODIUM CHLORIDE 0.9 % IV BOLUS (SEPSIS)
1000.0000 mL | Freq: Once | INTRAVENOUS | Status: AC
  Administered 2016-06-17: 1000 mL via INTRAVENOUS

## 2016-06-17 MED ORDER — LEVOFLOXACIN IN D5W 750 MG/150ML IV SOLN
750.0000 mg | Freq: Once | INTRAVENOUS | Status: AC
  Administered 2016-06-17: 750 mg via INTRAVENOUS
  Filled 2016-06-17: qty 150

## 2016-06-17 MED ORDER — SODIUM CHLORIDE 0.9 % IV SOLN
80.0000 mg | Freq: Once | INTRAVENOUS | Status: AC
  Administered 2016-06-17: 80 mg via INTRAVENOUS
  Filled 2016-06-17: qty 80

## 2016-06-17 MED ORDER — PANTOPRAZOLE SODIUM 20 MG PO TBEC: 20.0000 mg | DELAYED_RELEASE_TABLET | Freq: Every day | ORAL | 0 refills | Status: DC

## 2016-06-17 MED ORDER — ALBUTEROL SULFATE (2.5 MG/3ML) 0.083% IN NEBU
5.0000 mg | INHALATION_SOLUTION | Freq: Once | RESPIRATORY_TRACT | Status: AC
  Administered 2016-06-17: 5 mg via RESPIRATORY_TRACT
  Filled 2016-06-17: qty 6

## 2016-06-17 MED ORDER — ALBUTEROL SULFATE HFA 108 (90 BASE) MCG/ACT IN AERS: 1.0000 | INHALATION_SPRAY | Freq: Four times a day (QID) | RESPIRATORY_TRACT | 0 refills | Status: DC | PRN

## 2016-06-17 MED ORDER — IOPAMIDOL (ISOVUE-300) INJECTION 61%
100.0000 mL | Freq: Once | INTRAVENOUS | Status: AC | PRN
  Administered 2016-06-17: 100 mL via INTRAVENOUS

## 2016-06-17 MED ORDER — ALBUTEROL (5 MG/ML) CONTINUOUS INHALATION SOLN
10.0000 mg/h | INHALATION_SOLUTION | RESPIRATORY_TRACT | Status: DC
  Administered 2016-06-17: 10 mg/h via RESPIRATORY_TRACT
  Filled 2016-06-17: qty 20

## 2016-06-17 MED ORDER — LEVOFLOXACIN 750 MG PO TABS: 750.0000 mg | ORAL_TABLET | Freq: Every day | ORAL | 0 refills | Status: DC

## 2016-06-17 MED ORDER — PANTOPRAZOLE SODIUM 40 MG IV SOLR
INTRAVENOUS | Status: AC
  Filled 2016-06-17: qty 80

## 2016-06-17 NOTE — ED Notes (Signed)
Pt reports that headache, body aches, and fever has improved since he started having a flu like illness. Pt states the cough has persisted and this AM he noticed he was coughing up bright red blood.

## 2016-06-17 NOTE — ED Provider Notes (Signed)
[facility] DEPT Provider Note   CSN: [id number] Arrival date & time: 06/17/16  1720     History   Chief Complaint Chief Complaint  Patient presents with  . Influenza    HPI Kevin Jackson is a 46 y.o. male.  HPI Patient developed fever, chills, headache, diffuse myalgias and cough roughly one week ago. States his wife and one year-old child was diagnosed with influenza A. Was seen in the emergency department 3 days ago. Had chest x-ray performed at that time which was normal. He states his fever and chills have improved as well as his myalgias and headache. He has persistent cough. States the cough is nonproductive but that he developed hematemesis today. States he vomited a significant amount of blood. He also has had multiple episodes of dark loose stools. He complains of epigastric pain and nausea. History of gastric ulcer requiring emergent surgery. States he's been taking ibuprofen as well as other pain relievers for his flu-like symptoms. Past Medical History:  Diagnosis Date  . GERD (gastroesophageal reflux disease)   . Hypertension   . Kidney stones   . Neuralgia     Patient Active Problem List   Diagnosis Date Noted  . Chronic chest wall pain 08/28/2015  . 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 Numerous moles 01/19/2012  . Hypertension 03/17/2011  . CHEST PAIN 05/06/2010  . BACK PAIN, LUMBAR 08/06/2009    Past Surgical History:  Procedure Laterality Date  . APPENDECTOMY    . BACK SURGERY    . HERNIA REPAIR    . LAPAROSCOPIC GASTROTOMY W/ REPAIR OF ULCER    . right thoracotomy for exposure for the C7-T8 diskectomy  02/06/2007   Dr Arlyce Dice  . TONSILLECTOMY         Home Medications    Prior to Admission medications   Medication Sig Start Date End Date Taking? Authorizing Provider  albuterol (PROVENTIL HFA;VENTOLIN HFA) 108 (90 Base) MCG/ACT inhaler Inhale 1-2 puffs into the lungs every 6 (six) hours as needed for wheezing or shortness of breath.  06/17/16   Julianne Rice, MD  aspirin EC 81 MG tablet Take 81 mg by mouth daily.    Historical Provider, MD  benzonatate (TESSALON PERLES) 100 MG capsule Take 1 capsule (100 mg total) by mouth 3 (three) times daily as needed for cough. 06/14/16   Mayer Camel, MD  HYDROcodone-acetaminophen (NORCO) 10-325 MG tablet Take 1 tablet by mouth every 8 (eight) hours as needed. 02/16/16   Brunetta Jeans, PA-C  levofloxacin (LEVAQUIN) 750 MG tablet Take 1 tablet (750 mg total) by mouth daily. X 7 days 06/17/16   Julianne Rice, MD  lisinopril (PRINIVIL,ZESTRIL) 10 MG tablet Take 1 tablet (10 mg total) by mouth daily. 04/18/16   Brunetta Jeans, PA-C  ondansetron (ZOFRAN ODT) 4 MG disintegrating tablet Take 1 tablet (4 mg total) by mouth every 8 (eight) hours as needed for nausea or vomiting. 06/14/16   Mayer Camel, MD  ondansetron (ZOFRAN) 4 MG tablet Take 1 tablet (4 mg total) by mouth every 8 (eight) hours as needed for nausea or vomiting. 10/30/15   Brunetta Jeans, PA-C  pantoprazole (PROTONIX) 20 MG tablet Take 1 tablet (20 mg total) by mouth daily. 06/17/16   Julianne Rice, MD  tamsulosin (FLOMAX) 0.4 MG CAPS capsule Take 1 capsule (0.4 mg total) by mouth daily. 10/30/15   Brunetta Jeans, PA-C    Family History Family History  Problem Relation Age of Onset  . Hypertension Mother   .  Asthma Mother   . Cancer Paternal Uncle     intestinal  . Healthy Sister   . Healthy Brother   . Scoliosis Daughter   . Healthy Son   . Other Brother     tumors    Social History Social History  Substance Use Topics  . Smoking status: Former Smoker    Quit date: 05/23/1997  . Smokeless tobacco: Current User  . Alcohol use Yes     Allergies   Patient has no known allergies.   Review of Systems Review of Systems  Constitutional: Positive for fatigue. Negative for chills and fever.  HENT: Negative for congestion, dental problem, sinus pain and sore throat.   Respiratory: Positive for cough,  shortness of breath and wheezing.   Cardiovascular: Negative for chest pain, palpitations and leg swelling.  Gastrointestinal: Positive for abdominal pain, diarrhea, nausea and vomiting. Negative for constipation.  Genitourinary: Negative for dysuria, flank pain, frequency and hematuria.  Musculoskeletal: Negative for back pain, joint swelling, myalgias, neck pain and neck stiffness.  Skin: Negative for rash and wound.  Neurological: Negative for dizziness, speech difficulty, weakness, light-headedness, numbness and headaches.  Psychiatric/Behavioral: Negative for confusion, dysphoric mood and hallucinations.  All other systems reviewed and are negative.    Physical Exam Updated Vital Signs BP 108/68   Pulse 111   Temp 98.5 F (36.9 C) (Oral)   Resp 20   Ht 5\' 10"  (1.778 m)   Wt 194 lb (88 kg)   SpO2 98%   BMI 27.84 kg/m   Physical Exam  Constitutional: He is oriented to person, place, and time. He appears well-developed and well-nourished. No distress.  HENT:  Head: Normocephalic and atraumatic.  Mouth/Throat: Oropharynx is clear and moist. No oropharyngeal exudate.  Eyes: EOM are normal. Pupils are equal, round, and reactive to light.  Neck: Normal range of motion. Neck supple.  Cardiovascular: Normal rate and regular rhythm.  Exam reveals no gallop and no friction rub.   No murmur heard. Pulmonary/Chest: Effort normal. No respiratory distress. He has wheezes (Diminished breath sounds throughout with scattered wheezes.). He exhibits no tenderness.  Abdominal: Soft. Bowel sounds are normal. He exhibits no mass. There is tenderness (epigastric tenderness to palpation.). There is no rebound and no guarding. No hernia.  Musculoskeletal: Normal range of motion. He exhibits no edema or tenderness.  No lower extremity swelling, asymmetry or tenderness. No CVA tenderness.  Lymphadenopathy:    He has no cervical adenopathy.  Neurological: He is alert and oriented to person, place,  and time.  5/5 motor in all extremities. Sensation is intact.  Skin: Skin is warm and dry. Capillary refill takes less than 2 seconds. No rash noted. No erythema.  Psychiatric: He has a normal mood and affect. His behavior is normal.  Nursing note and vitals reviewed.    ED Treatments / Results  Labs (all labs ordered are listed, but only abnormal results are displayed) Labs Reviewed  COMPREHENSIVE METABOLIC PANEL - Abnormal; Notable for the following:       Result Value   AST 86 (*)    ALT 91 (*)    All other components within normal limits  CBC WITH DIFFERENTIAL/PLATELET  LIPASE, BLOOD  OCCULT BLOOD X 1 CARD TO LAB, STOOL    EKG  EKG Interpretation  Date/Time:  Friday June 17 2016 18:33:51 EST Ventricular Rate:  80 PR Interval:    QRS Duration: 99 QT Interval:  354 QTC Calculation: 409 R Axis:   45 Text  Interpretation:  Sinus rhythm Borderline short PR interval Baseline wander in lead(s) I V5 V6 No significant change since last tracing Confirmed by Zenia Resides  MD, ANTHONY ([id number]) on 06/18/2016 5:49:35 PM       Radiology Dg Chest 2 View  Result Date: 06/17/2016 CLINICAL DATA:  Pt has had the flu x 1 week, now has productive cough and coughing up blood. EXAM: CHEST  2 VIEW COMPARISON:  06/14/2016 FINDINGS: Cardiac and mediastinal margins appear normal. Low lung volumes are present, causing crowding of the pulmonary vasculature. Linear subsegmental atelectasis at the right lung base. The lungs appear otherwise clear. No pleural effusion. IMPRESSION: 1. Linear subsegmental atelectasis at the right lung base. Low lung volumes. If the patient has true hemoptysis then CT chest may be warranted. Electronically Signed   By: Van Clines M.D.   On: 06/17/2016 18:19   Ct Chest W Contrast  Result Date: 06/17/2016 CLINICAL DATA:  Acute onset of headache, body aches and fever. Hemoptysis and central chest pain. Initial encounter. EXAM: CT CHEST WITH CONTRAST TECHNIQUE:  Multidetector CT imaging of the chest was performed during intravenous contrast administration. CONTRAST:  183mL ISOVUE-300 IOPAMIDOL (ISOVUE-300) INJECTION 61% COMPARISON:  Chest radiograph performed earlier today at 6:13 p.m., and MRI of the thoracic spine performed 04/12/2011 FINDINGS: Cardiovascular: The heart is normal in size. The thoracic aorta is unremarkable. No calcific atherosclerotic disease is seen. The great vessels are grossly unremarkable. Mediastinum/Nodes: Visualized mediastinal nodes remain borderline normal in size. No pericardial effusion is identified. The visualized portions of the thyroid gland are unremarkable. No axillary lymphadenopathy is seen. Lungs/Pleura: Mild patchy bilateral peripheral airspace opacities raise concern for atypical pneumonia. Septic emboli cannot be excluded. No pleural effusion or pneumothorax is seen. No dominant masses are identified. Upper Abdomen: The visualized portions of the liver are unremarkable. The spleen is enlarged, measuring 16.1 cm in length. The visualized portions of the gallbladder, pancreas and adrenal glands are unremarkable. A small left renal cyst is noted. Musculoskeletal: No acute osseous abnormalities are identified. The visualized musculature is unremarkable in appearance. IMPRESSION: 1. Mild patchy bilateral peripheral airspace opacities raise concern for atypical pneumonia. Septic emboli cannot be excluded. 2. Splenomegaly noted. 3. Small left renal cyst seen. Electronically Signed   By: Garald Balding M.D.   On: 06/17/2016 19:25    Procedures Procedures (including critical care time)  Medications Ordered in ED Medications  pantoprazole (PROTONIX) 80 mg in sodium chloride 0.9 % 100 mL IVPB (0 mg Intravenous Stopped 06/17/16 1908)  albuterol (PROVENTIL) (2.5 MG/3ML) 0.083% nebulizer solution 5 mg (5 mg Nebulization Given 06/17/16 1844)  sodium chloride 0.9 % bolus 1,000 mL (0 mLs Intravenous Stopped 06/17/16 1948)  iopamidol  (ISOVUE-300) 61 % injection 100 mL (100 mLs Intravenous Contrast Given 06/17/16 1902)  levofloxacin (LEVAQUIN) IVPB 750 mg (0 mg Intravenous Stopped 06/17/16 2222)     Initial Impression / Assessment and Plan / ED Course  I have reviewed the triage vital signs and the nursing notes.  Pertinent labs & imaging results that were available during my care of the patient were reviewed by me and considered in my medical decision making (see chart for details).    Concern for NSAID-induced gastritis and hemoptysis. Patient's flulike symptoms appear to be improving. Currently hemodynamically stable and in no distress. Patient with a questionable pneumonia on x-ray. States he is feeling better after breathing treatments. No evidence of GI bleed. Hemoccult negative with a normal hemoglobin. Return precautions given. Final Clinical Impressions(s) / ED Diagnoses  Final diagnoses:  Community acquired pneumonia, unspecified laterality  Cough with hemoptysis  NSAID induced gastritis    New Prescriptions Discharge Medication List as of 06/17/2016 11:11 PM    START taking these medications   Details  albuterol (PROVENTIL HFA;VENTOLIN HFA) 108 (90 Base) MCG/ACT inhaler Inhale 1-2 puffs into the lungs every 6 (six) hours as needed for wheezing or shortness of breath., Starting Fri 06/17/2016, Print    levofloxacin (LEVAQUIN) 750 MG tablet Take 1 tablet (750 mg total) by mouth daily. X 7 days, Starting Fri 06/17/2016, Print    pantoprazole (PROTONIX) 20 MG tablet Take 1 tablet (20 mg total) by mouth daily., Starting Fri 06/17/2016, Print         Julianne Rice, MD 06/19/16 [phone]

## 2016-06-17 NOTE — Discharge Instructions (Signed)
Avoid all NSAIDs including ibuprofen, naproxen and aspirin.

## 2016-06-17 NOTE — ED Triage Notes (Signed)
Seen at [facility] 3 days ago for fever, chills, body aches, headache, cough, congestion. He has been exposed to flu. His family took Tamaflu and he did not.

## 2017-02-17 ENCOUNTER — Telehealth: Payer: Self-pay | Admitting: *Deleted

## 2017-02-17 NOTE — Telephone Encounter (Signed)
Received request for Medical Records from [facility], [place]; forwarded to [place] for email/scan/SLS 09/28

## 2019-07-22 ENCOUNTER — Other Ambulatory Visit: Payer: Self-pay

## 2019-07-22 MED ORDER — ATORVASTATIN CALCIUM 40 MG PO TABS: 40.0000 mg | ORAL_TABLET | Freq: Every day | ORAL | 0 refills | Status: DC

## 2019-07-29 ENCOUNTER — Other Ambulatory Visit: Payer: Self-pay

## 2019-07-29 ENCOUNTER — Encounter: Payer: Self-pay | Admitting: Legal Medicine

## 2019-07-29 ENCOUNTER — Ambulatory Visit (INDEPENDENT_AMBULATORY_CARE_PROVIDER_SITE_OTHER): Payer: Self-pay | Admitting: Legal Medicine

## 2019-07-29 VITALS — BP 148/90 | HR 75 | Temp 97.7°F | Ht 70.0 in | Wt 204.8 lb

## 2019-07-29 DIAGNOSIS — M545 Low back pain, unspecified: Secondary | ICD-10-CM

## 2019-07-29 LAB — POCT URINALYSIS DIP (CLINITEK)
Bilirubin, UA: NEGATIVE
Blood, UA: NEGATIVE
Glucose, UA: NEGATIVE mg/dL
Ketones, POC UA: NEGATIVE mg/dL
Leukocytes, UA: NEGATIVE
Nitrite, UA: NEGATIVE
POC PROTEIN,UA: NEGATIVE
Spec Grav, UA: 1.025 (ref 1.010–1.025)
Urobilinogen, UA: 0.2 E.U./dL
pH, UA: 6 (ref 5.0–8.0)

## 2019-07-29 MED ORDER — TRIAMCINOLONE ACETONIDE 0.1 % EX CREA
1.0000 | TOPICAL_CREAM | Freq: Two times a day (BID) | CUTANEOUS | 0 refills | Status: DC
Start: 2019-07-29 — End: 2019-09-17

## 2019-07-29 MED ORDER — HYDROCODONE-ACETAMINOPHEN 5-325 MG PO TABS: 1.0000 | ORAL_TABLET | Freq: Four times a day (QID) | ORAL | 0 refills | Status: DC | PRN

## 2019-07-29 MED ORDER — TRIAMCINOLONE ACETONIDE 40 MG/ML IJ SUSP: 80.0000 mg | Freq: Once | INTRAMUSCULAR | Status: DC

## 2019-07-29 MED ORDER — TRIAMCINOLONE ACETONIDE 40 MG/ML IJ SUSP
80.0000 mg | Freq: Once | INTRAMUSCULAR | Status: AC
  Administered 2019-07-29: 10:00:00 80 mg via INTRAMUSCULAR

## 2019-07-29 MED ORDER — CYCLOBENZAPRINE HCL 10 MG PO TABS: 10.0000 mg | ORAL_TABLET | Freq: Three times a day (TID) | ORAL | 0 refills | Status: DC | PRN

## 2019-07-29 NOTE — Patient Instructions (Signed)

## 2019-07-29 NOTE — Progress Notes (Signed)
Acute Office Visit  Subjective:    Patient ID: Kevin Jackson, male    DOB: 1970/06/15, 49 y.o.   MRN: [id number]  Chief Complaint  Patient presents with  . Back Pain    HPI Patient is in today for right back pain.  He was lifting heavy tools, Advertising account planner at work.  On Friday.  He woke up Saturday with back pain on right.  He has laminectomy 2008 thoracic spine.  No radiation of pain.  He has using heat over weekend. No bladder or bowel symptoms.  No red flags.  Past Medical History:  Diagnosis Date  . GERD (gastroesophageal reflux disease)   . Hypertension   . Kidney stones   . Neuralgia     Past Surgical History:  Procedure Laterality Date  . APPENDECTOMY    . BACK SURGERY    . HERNIA REPAIR    . LAPAROSCOPIC GASTROTOMY W/ REPAIR OF ULCER    . right thoracotomy for exposure for the C7-T8 diskectomy  02/06/2007   Dr Arlyce Dice  . TONSILLECTOMY      Family History  Problem Relation Age of Onset  . Hypertension Mother   . Asthma Mother   . Cancer Paternal Uncle        intestinal  . Healthy Sister   . Healthy Brother   . Scoliosis Daughter   . Healthy Son   . Other Brother        tumors    Social History   Socioeconomic History  . Marital status: Married    Spouse name: Not on file  . Number of children: Not on file  . Years of education: Not on file  . Highest education level: Not on file  Occupational History    Employer: [employer]  Tobacco Use  . Smoking status: Former Smoker    Quit date: 05/23/1997    Years since quitting: 22.1  . Smokeless tobacco: Current User    Types: Snuff  Substance and Sexual Activity  . Alcohol use: Yes  . Drug use: No  . Sexual activity: Not on file  Other Topics Concern  . Not on file  Social History Narrative  . Not on file   Social Determinants of Health   Financial Resource Strain:   . Difficulty of Paying Living Expenses: Not on file  Food Insecurity:   . Worried About Charity fundraiser in the  Last Year: Not on file  . Ran Out of Food in the Last Year: Not on file  Transportation Needs:   . Lack of Transportation (Medical): Not on file  . Lack of Transportation (Non-Medical): Not on file  Physical Activity:   . Days of Exercise per Week: Not on file  . Minutes of Exercise per Session: Not on file  Stress:   . Feeling of Stress : Not on file  Social Connections:   . Frequency of Communication with Friends and Family: Not on file  . Frequency of Social Gatherings with Friends and Family: Not on file  . Attends Religious Services: Not on file  . Active Member of Clubs or Organizations: Not on file  . Attends Archivist Meetings: Not on file  . Marital Status: Not on file  Intimate Partner Violence:   . Fear of Current or Ex-Partner: Not on file  . Emotionally Abused: Not on file  . Physically Abused: Not on file  . Sexually Abused: Not on file    Outpatient Medications Prior to Visit  Medication Sig Dispense Refill  . atorvastatin (LIPITOR) 40 MG tablet Take 1 tablet (40 mg total) by mouth daily. 90 tablet 0  . metoprolol tartrate (LOPRESSOR) 50 MG tablet Take 50 mg by mouth 2 (two) times daily.    Marland Kitchen albuterol (PROVENTIL HFA;VENTOLIN HFA) 108 (90 Base) MCG/ACT inhaler Inhale 1-2 puffs into the lungs every 6 (six) hours as needed for wheezing or shortness of breath. 1 Inhaler 0  . aspirin EC 81 MG tablet Take 81 mg by mouth daily.    . benzonatate (TESSALON PERLES) 100 MG capsule Take 1 capsule (100 mg total) by mouth 3 (three) times daily as needed for cough. 20 capsule 0  . HYDROcodone-acetaminophen (NORCO) 10-325 MG tablet Take 1 tablet by mouth every 8 (eight) hours as needed. 30 tablet 0  . levofloxacin (LEVAQUIN) 750 MG tablet Take 1 tablet (750 mg total) by mouth daily. X 7 days 7 tablet 0  . lisinopril (PRINIVIL,ZESTRIL) 10 MG tablet Take 1 tablet (10 mg total) by mouth daily. 30 tablet 3  . ondansetron (ZOFRAN ODT) 4 MG disintegrating tablet Take 1 tablet  (4 mg total) by mouth every 8 (eight) hours as needed for nausea or vomiting. 20 tablet 0  . ondansetron (ZOFRAN) 4 MG tablet Take 1 tablet (4 mg total) by mouth every 8 (eight) hours as needed for nausea or vomiting. 20 tablet 0  . pantoprazole (PROTONIX) 20 MG tablet Take 1 tablet (20 mg total) by mouth daily. 30 tablet 0  . tamsulosin (FLOMAX) 0.4 MG CAPS capsule Take 1 capsule (0.4 mg total) by mouth daily. 20 capsule 0   No facility-administered medications prior to visit.    No Known Allergies  Review of Systems  Constitutional: Negative.   HENT: Negative.   Eyes: Negative.   Respiratory: Negative.   Cardiovascular: Negative.   Gastrointestinal: Negative.   Endocrine: Negative.   Genitourinary: Negative.   Musculoskeletal: Positive for back pain.  Neurological: Negative.        Objective:    Physical Exam Vitals reviewed.  Constitutional:      Appearance: Normal appearance.  HENT:     Head: Normocephalic and atraumatic.     Nose: Nose normal.  Eyes:     Extraocular Movements: Extraocular movements intact.     Pupils: Pupils are equal, round, and reactive to light.  Cardiovascular:     Rate and Rhythm: Normal rate and regular rhythm.     Pulses: Normal pulses.     Heart sounds: Normal heart sounds.  Pulmonary:     Effort: Pulmonary effort is normal.     Breath sounds: Normal breath sounds.  Abdominal:     General: Bowel sounds are normal.  Musculoskeletal:     Cervical back: Normal.     Thoracic back: Normal.     Lumbar back: Spasms and tenderness present. Positive right straight leg raise test.       Back:  Neurological:     Mental Status: He is alert.     BP (!) 148/90   Pulse 75   Temp 97.7 F (36.5 C)   Ht 5\' 10"  (1.778 m)   Wt 204 lb 12.8 oz (92.9 kg)   SpO2 97%   BMI 29.39 kg/m  Wt Readings from Last 3 Encounters:  07/29/19 204 lb 12.8 oz (92.9 kg)  06/17/16 194 lb (88 kg)  06/14/16 194 lb (88 kg)    Health Maintenance Due  Topic Date  Due  . HIV Screening  02/08/1986  .  INFLUENZA VACCINE  12/22/2018    There are no preventive care reminders to display for this patient.   No results found for: TSH Lab Results  Component Value Date   WBC 5.3 06/17/2016   HGB 14.3 06/17/2016   HCT 40.1 06/17/2016   MCV 85.3 06/17/2016   PLT 174 06/17/2016   Lab Results  Component Value Date   NA 139 06/17/2016   K 3.6 06/17/2016   CO2 27 06/17/2016   GLUCOSE 93 06/17/2016   BUN 10 06/17/2016   CREATININE 0.96 06/17/2016   BILITOT 1.1 06/17/2016   ALKPHOS 56 06/17/2016   AST 86 (H) 06/17/2016   ALT 91 (H) 06/17/2016   PROT 7.0 06/17/2016   ALBUMIN 3.9 06/17/2016   CALCIUM 8.9 06/17/2016   ANIONGAP 10 06/17/2016                                                                                                                                           No results found for: CHOL No results found for: HDL No results found for: LDLCALC No results found for: TRIG No results found for: CHOLHDL No results found for: HGBA1C     Assessment & Plan:   Problem List Items Addressed This Visit      Other   BACK PAIN, LUMBAR - Primary    Patient has acute back strain and we will treat with Kenalog 8mg  IM for swelling, flexeril 10mg  for muscle spasms, and hydrocodone for pain.  Follow up one week, not e for work      Relevant Medications   triamcinolone cream (KENALOG) 0.1 %   cyclobenzaprine (FLEXERIL) 10 MG tablet   HYDROcodone-acetaminophen (NORCO/VICODIN) 5-325 MG tablet   Other Relevant Orders   POCT URINALYSIS DIP (CLINITEK) (Completed)       Meds ordered this encounter  Medications  . triamcinolone cream (KENALOG) 0.1 %    Sig: Apply 1 application topically 2 (two) times daily.    Dispense:  30 g    Refill:  0  . cyclobenzaprine (FLEXERIL) 10 MG tablet    Sig: Take 1 tablet (10 mg total) by mouth 3 (three) times daily as needed for muscle spasms.    Dispense:  30 tablet    Refill:  0  .  HYDROcodone-acetaminophen (NORCO/VICODIN) 5-325 MG tablet    Sig: Take 1 tablet by mouth every 6 (six) hours as needed for moderate pain.    Dispense:  30 tablet    Refill:  0  . DISCONTD: triamcinolone acetonide (KENALOG-40) injection 80 mg  . triamcinolone acetonide (KENALOG-40) injection 80 mg     Reinaldo Meeker, MD

## 2019-07-29 NOTE — Assessment & Plan Note (Addendum)
Patient has acute back strain and we will treat with Kenalog 8mg  IM for swelling, flexeril 10mg  for muscle spasms, and hydrocodone for pain.  Follow up one week, not e for work

## 2019-08-05 ENCOUNTER — Ambulatory Visit: Payer: Self-pay | Admitting: Legal Medicine

## 2019-08-06 ENCOUNTER — Ambulatory Visit: Payer: Self-pay | Admitting: Legal Medicine

## 2019-08-06 ENCOUNTER — Other Ambulatory Visit: Payer: Self-pay

## 2019-08-07 ENCOUNTER — Ambulatory Visit: Payer: Self-pay

## 2019-08-07 ENCOUNTER — Other Ambulatory Visit: Payer: Self-pay | Admitting: Family Medicine

## 2019-08-07 DIAGNOSIS — M545 Low back pain, unspecified: Secondary | ICD-10-CM

## 2019-08-09 ENCOUNTER — Ambulatory Visit: Payer: Self-pay | Admitting: Legal Medicine

## 2019-09-03 ENCOUNTER — Other Ambulatory Visit: Payer: Self-pay | Admitting: Legal Medicine

## 2019-09-04 ENCOUNTER — Encounter: Payer: Self-pay | Admitting: Orthopaedic Surgery

## 2019-09-04 ENCOUNTER — Other Ambulatory Visit: Payer: Self-pay

## 2019-09-04 ENCOUNTER — Ambulatory Visit (INDEPENDENT_AMBULATORY_CARE_PROVIDER_SITE_OTHER): Payer: Worker's Compensation | Admitting: Orthopaedic Surgery

## 2019-09-04 VITALS — BP 139/86 | HR 70 | Ht 70.0 in | Wt 204.0 lb

## 2019-09-04 DIAGNOSIS — M545 Low back pain, unspecified: Secondary | ICD-10-CM

## 2019-09-04 MED ORDER — METOPROLOL TARTRATE 50 MG PO TABS: 50.0000 mg | ORAL_TABLET | Freq: Two times a day (BID) | ORAL | 0 refills | Status: DC

## 2019-09-04 MED ORDER — TRAMADOL HCL 50 MG PO TABS: 50.0000 mg | ORAL_TABLET | Freq: Two times a day (BID) | ORAL | 0 refills | Status: DC | PRN

## 2019-09-04 NOTE — Progress Notes (Signed)
Office Visit Note   [name]           Date of Birth: Sep 27, 1970           MRN: [id number] Visit Date: 09/04/2019              Requested by: Kevin Anes, MD [address] [place],  [place] [address] PCP: Kevin Anes, MD   Assessment & Plan: Visit Diagnoses:  1. Acute right-sided low back pain, unspecified whether sciatica present     Plan: Patient has had past history of thoracic disc herniation.  With his acute pain and inability to do bending and amount of lifting he normally does on the job I recommend proceeding with an MRI scan for evaluation.  Work slip given no work pending MRI scan lumbar spine and return visit for review. Follow-Up Instructions: Return after lumbar MRI scan.  Orders:  Orders Placed This Encounter  Procedures  . MR Lumbar Spine w/o contrast   Meds ordered this encounter  Medications  . traMADol (ULTRAM) 50 MG tablet    Sig: Take 1 tablet (50 mg total) by mouth every 12 (twelve) hours as needed.    Dispense:  30 tablet    Refill:  0    W/C injury      Procedures: No procedures performed   Clinical Data: No additional findings.   Subjective: Chief Complaint  Patient presents with  . Lower Back - Pain    OTJI 07/26/2019    HPI 49 year old male seen with an on-the-job injury of his back on 07/26/2019.  He was picking up 90 pound piece of equipment to put on a work bench and states he picked that carry 20 feet and his back was painful tight and by the following morning he had pinching pain.  Past history of T7 disc herniation and he had seen Dr. Louanne Jackson.  Patient states his lower back since 07/26/2019 has been back and right leg pain with right medial lower leg numb.  Has some burning the bottom of his 4 toes.  Patient states she had trigger point injection did not get any relief he took some prednisone without relief.  He went back to work after the injury worked a few days and then has been out of  work since 07/31/2019 due to increased symptoms.  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  He is currently out of work.  Patient has bowel bladder symptoms no fever or chills.  He has back pain right leg pain.  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  Review of Systems past history notable for hypertension short PR interval without current chest pain no dyspnea.  Previous T7 disc herniation with compression.   Objective: Vital Signs: BP 139/86   Pulse 70   Ht 5\' 10"  (1.778 m)   Wt 204 lb (92.5 kg)   BMI 29.27 kg/m   Physical Exam Constitutional:      Appearance: He is well-developed.  HENT:     Head: Normocephalic and atraumatic.  Eyes:     Pupils: Pupils are equal, round, and reactive to light.  Neck:     Thyroid: No thyromegaly.     Trachea: No tracheal deviation.  Cardiovascular:     Rate and Rhythm: Normal rate.  Pulmonary:     Effort: Pulmonary effort is normal.  Breath sounds: No wheezing.  Abdominal:     General: Bowel sounds are normal.     Palpations: Abdomen is soft.  Skin:    General: Skin is warm and dry.     Capillary Refill: Capillary refill takes less than 2 seconds.  Neurological:     Mental Status: He is alert and oriented to person, place, and time.  Psychiatric:        Behavior: Behavior normal.        Thought Content: Thought content normal.        Judgment: Judgment normal.     Ortho Exam patient has some lumbar tenderness negative straight leg raising to 90 degrees knee and ankle jerk are intact he is able to heel and toe walk.  Some paraspinal tenderness lumbar region worse on the right than left.  Peroneal gastrocsoleus ankle dorsiflexion EHL is intact.  Specialty Comments:  No specialty comments available.  Imaging: CLINICAL DATA:  Persistent lumbar back pain since 07/26/2019 after heavy lifting.  EXAM: LUMBAR SPINE -  COMPLETE 4+ VIEW  COMPARISON:  Noncontrast abdominopelvic CT 10/24/2015  FINDINGS: The alignment is maintained. Vertebral body heights are normal. There is no listhesis. The posterior elements are intact. Minor endplate spurring is superior endplates of L4 and L5 with preservation of disc spaces. No fracture. Sacroiliac joints are symmetric and normal. 3 mm calcification projects over the mid right kidney consistent with renal stone.  IMPRESSION: 1. No acute abnormality of the lumbar spine. Minimal endplate spurring at L4 and L5. 2. A 3 mm right kidney stone.   Electronically Signed   By: Kevin Jackson M.D.   On: 08/07/2019 14:35   PMFS History: Patient Active Problem List   Diagnosis Date Noted  . Chronic chest wall pain 08/28/2015  . 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 Numerous moles 01/19/2012  . Hypertension 03/17/2011  . CHEST PAIN 05/06/2010  . BACK PAIN, LUMBAR 08/06/2009   Past Medical History:  Diagnosis Date  . GERD (gastroesophageal reflux disease)   . Hypertension   . Kidney stones   . Neuralgia     Family History  Problem Relation Age of Onset  . Hypertension Mother   . Asthma Mother   . Cancer Paternal Uncle        intestinal  . Healthy Sister   . Healthy Brother   . Scoliosis Daughter   . Healthy Son   . Other Brother        tumors    Past Surgical History:  Procedure Laterality Date  . APPENDECTOMY    . BACK SURGERY    . HERNIA REPAIR    . LAPAROSCOPIC GASTROTOMY W/ REPAIR OF ULCER    . right thoracotomy for exposure for the C7-T8 diskectomy  02/06/2007   Dr Kevin Jackson  . TONSILLECTOMY     Social History   Occupational History    Employer: [employer]  Tobacco Use  . Smoking status: Former Smoker    Quit date: 05/23/1997    Years since quitting: 22.3  . Smokeless tobacco: Current User    Types: Snuff  Substance and Sexual Activity  . Alcohol use: Yes  . Drug use: No  . Sexual activity: Not on file

## 2019-09-11 ENCOUNTER — Telehealth: Payer: Self-pay | Admitting: *Deleted

## 2019-09-11 NOTE — Telephone Encounter (Signed)
Received call from [facility] with [facility] stating they are needing Doctor signature in order to get pt scheduled. Orrder has sent to [phone] to [place]

## 2019-09-17 ENCOUNTER — Encounter: Payer: Self-pay | Admitting: Orthopaedic Surgery

## 2019-09-17 ENCOUNTER — Ambulatory Visit: Payer: 59 | Admitting: Legal Medicine

## 2019-09-17 ENCOUNTER — Other Ambulatory Visit: Payer: Self-pay

## 2019-09-17 ENCOUNTER — Encounter: Payer: Self-pay | Admitting: Legal Medicine

## 2019-09-17 VITALS — BP 130/80 | HR 76 | Temp 98.0°F | Resp 16 | Ht 70.0 in | Wt 198.4 lb

## 2019-09-17 DIAGNOSIS — I471 Supraventricular tachycardia, unspecified: Secondary | ICD-10-CM

## 2019-09-17 DIAGNOSIS — I10 Essential (primary) hypertension: Secondary | ICD-10-CM

## 2019-09-17 DIAGNOSIS — M545 Low back pain, unspecified: Secondary | ICD-10-CM

## 2019-09-17 DIAGNOSIS — R072 Precordial pain: Secondary | ICD-10-CM | POA: Diagnosis not present

## 2019-09-17 DIAGNOSIS — E782 Mixed hyperlipidemia: Secondary | ICD-10-CM

## 2019-09-17 HISTORY — DX: Supraventricular tachycardia: I47.1

## 2019-09-17 HISTORY — DX: Mixed hyperlipidemia: E78.2

## 2019-09-17 HISTORY — DX: Supraventricular tachycardia, unspecified: I47.10

## 2019-09-17 MED ORDER — ATORVASTATIN CALCIUM 40 MG PO TABS: 40.0000 mg | ORAL_TABLET | Freq: Every day | ORAL | 2 refills | Status: DC

## 2019-09-17 MED ORDER — METOPROLOL TARTRATE 50 MG PO TABS: 50.0000 mg | ORAL_TABLET | Freq: Two times a day (BID) | ORAL | 2 refills | Status: DC

## 2019-09-17 NOTE — Assessment & Plan Note (Signed)
AN INDIVIDUAL CARE PLAN for hyperlipidemia/ cholesterol was established and reinforced today.  The patient's status was assessed using clinical findings on exam, lab and other diagnostic tests. The patient's disease status was assessed based on evidence-based guidelines and found to be well controlled. MEDICATIONS were renewed. SELF MANAGEMENT GOALS have been discussed and patient's success at attaining the goal of low cholesterol was assessed. RECOMMENDATION given include regular exercise 3 days a week and low cholesterol/low fat diet. CLINICAL SUMMARY including written plan to identify barriers unique to the patient due to social or economic  reasons was discussed. 

## 2019-09-17 NOTE — Assessment & Plan Note (Signed)
He is seeing Dr. Samule Dry and to get MRI back.  He has history of spinal stenosis.

## 2019-09-17 NOTE — Patient Instructions (Signed)

## 2019-09-17 NOTE — Progress Notes (Signed)
Established Patient Office Visit  Subjective:  Patient ID: Kevin Jackson, male    DOB: 1970/08/26  Age: 49 y.o. MRN: [id number]  CC:  Chief Complaint  Patient presents with  . Hypertension  . Hyperlipidemia  . Back Pain    Chronic back pain    HPI [place]ak presents for Chronic visit.  Patient presents for follow up of hypertension.  Patient tolerating metoprolol well with side effects.  Patient was diagnosed with hypertension 2015 so has been treated for hypertension for 5 years.Patient is working on maintaining diet and exercise regimen and follows up as directed. Complication include none.  Patient presents with hyperlipidemia.  Compliance with treatment has been good; patient takes medicines as directed, maintains low cholesterol diet, follows up as directed, and maintains exercise regimen.  Patient is using atorvastatin without problems.  Chronic Back pain- seeing Dr. Samule Dry for back, To get MRI.  CORONARY ARTERY DISEASE  Patient presents in follow up of CAD. Patient was diagnosed in 2020 in ER. The patient has no associated CHF. The patient is currently taking a beta blocker, statin, and aspirin. CAD was diagnosed 1 years ago.  Patient is having no angina. Patient has used no NTG.  Patient is followed by cardiology.  Patient had none . Last angiography was none, last echocardiogram none.  He did not see cardiology since he had no insurance.  She still gets pauses and at time tachycardia and dyspnea.  Past Medical History:  Diagnosis Date  . GERD (gastroesophageal reflux disease)   . Hypertension   . Kidney stones   . Mixed hyperlipidemia 09/17/2019  . Neuralgia   . Supraventricular tachycardia ([facility]) 09/17/2019    Past Surgical History:  Procedure Laterality Date  . APPENDECTOMY    . BACK SURGERY    . HERNIA REPAIR    . LAPAROSCOPIC GASTROTOMY W/ REPAIR OF ULCER    . right thoracotomy for exposure for the C7-T8 diskectomy  02/06/2007   Dr Arlyce Dice    . TONSILLECTOMY      Family History  Problem Relation Age of Onset  . Hypertension Mother   . Asthma Mother   . Cancer Paternal Uncle        intestinal  . Healthy Sister   . Healthy Brother   . Scoliosis Daughter   . Healthy Son   . Other Brother        tumors    Social History   Socioeconomic History  . Marital status: Married    Spouse name: Not on file  . Number of children: Not on file  . Years of education: Not on file  . Highest education level: Not on file  Occupational History    Employer: [employer]  Tobacco Use  . Smoking status: Former Smoker    Types: Cigarettes    Quit date: 05/23/1997    Years since quitting: 22.3  . Smokeless tobacco: Current User    Types: Chew  Substance and Sexual Activity  . Alcohol use: Yes    Alcohol/week: 6.0 standard drinks    Types: 6 Cans of beer per week    Comment: Once a month  . Drug use: Not Currently  . Sexual activity: Yes  Other Topics Concern  . Not on file  Social History Narrative  . Not on file   Social Determinants of Health   Financial Resource Strain:   . Difficulty of Paying Living Expenses:   Food Insecurity:   . Worried About Charity fundraiser  in the Last Year:   . [place] in the Last Year:   Transportation Needs:   . Film/video editor (Medical):   Marland Kitchen Lack of Transportation (Non-Medical):   Physical Activity:   . Days of Exercise per Week:   . Minutes of Exercise per Session:   Stress:   . Feeling of Stress :   Social Connections:   . Frequency of Communication with Friends and Family:   . Frequency of Social Gatherings with Friends and Family:   . Attends Religious Services:   . Active Member of Clubs or Organizations:   . Attends Archivist Meetings:   Marland Kitchen Marital Status:   Intimate Partner Violence:   . Fear of Current or Ex-Partner:   . Emotionally Abused:   Marland Kitchen Physically Abused:   . Sexually Abused:     Outpatient Medications Prior to Visit  Medication  Sig Dispense Refill  . traMADol (ULTRAM) 50 MG tablet Take 1 tablet (50 mg total) by mouth every 12 (twelve) hours as needed. 30 tablet 0  . atorvastatin (LIPITOR) 40 MG tablet Take 1 tablet (40 mg total) by mouth daily. 90 tablet 0  . metoprolol tartrate (LOPRESSOR) 50 MG tablet Take 1 tablet (50 mg total) by mouth 2 (two) times daily with a meal. 60 tablet 0  . cyclobenzaprine (FLEXERIL) 10 MG tablet Take 1 tablet (10 mg total) by mouth 3 (three) times daily as needed for muscle spasms. (Patient not taking: Reported on 09/04/2019) 30 tablet 0  . HYDROcodone-acetaminophen (NORCO/VICODIN) 5-325 MG tablet Take 1 tablet by mouth every 6 (six) hours as needed for moderate pain. (Patient not taking: Reported on 09/04/2019) 30 tablet 0  . triamcinolone cream (KENALOG) 0.1 % Apply 1 application topically 2 (two) times daily. 30 g 0   No facility-administered medications prior to visit.    No Known Allergies  ROS Review of Systems  Constitutional: Negative.   HENT: Negative.   Cardiovascular: Negative.   Gastrointestinal: Negative.   Endocrine: Negative.   Genitourinary: Negative.   Musculoskeletal: Positive for back pain.  Skin: Negative.   Neurological: Negative.   Psychiatric/Behavioral: Negative.       Objective:    Physical Exam  Constitutional: He appears well-developed and well-nourished.  HENT:  Head: Normocephalic and atraumatic.  Right Ear: External ear normal.  Left Ear: External ear normal.  Eyes: Pupils are equal, round, and reactive to light. Conjunctivae and EOM are normal.  Cardiovascular: Normal rate, regular rhythm, normal heart sounds and intact distal pulses.  Pulmonary/Chest: Effort normal and breath sounds normal.  Abdominal: Soft.  Musculoskeletal:     Cervical back: Normal range of motion and neck supple.     Lumbar back: Tenderness present.  Vitals reviewed.   BP 130/80 (BP Location: Right Arm, Patient Position: Sitting)   Pulse 76   Temp 98 F (36.7  C) (Temporal)   Resp 16   Ht 5\' 10"  (1.778 m)   Wt 198 lb 6.4 oz (90 kg)   BMI 28.47 kg/m  Wt Readings from Last 3 Encounters:  09/17/19 198 lb 6.4 oz (90 kg)  09/04/19 204 lb (92.5 kg)  07/29/19 204 lb 12.8 oz (92.9 kg)     Health Maintenance Due  Topic Date Due  . HIV Screening  Never done  . COVID-19 Vaccine (1) Never done    There are no preventive care reminders to display for this patient.  No results found for: TSH Lab Results  Component Value  Date   WBC 5.3 06/17/2016   HGB 14.3 06/17/2016   HCT 40.1 06/17/2016   MCV 85.3 06/17/2016   PLT 174 06/17/2016   Lab Results  Component Value Date   NA 139 06/17/2016   K 3.6 06/17/2016   CO2 27 06/17/2016   GLUCOSE 93 06/17/2016   BUN 10 06/17/2016   CREATININE 0.96 06/17/2016   BILITOT 1.1 06/17/2016   ALKPHOS 56 06/17/2016   AST 86 (H) 06/17/2016   ALT 91 (H) 06/17/2016   PROT 7.0 06/17/2016   ALBUMIN 3.9 06/17/2016   CALCIUM 8.9 06/17/2016   ANIONGAP 10 06/17/2016   No results found for: CHOL No results found for: HDL No results found for: LDLCALC No results found for: TRIG No results found for: CHOLHDL No results found for: HGBA1C    Assessment & Plan:   Problem List Items Addressed This Visit      Cardiovascular and Mediastinum   Hypertension - Primary    An individual hypertension care plan was established and reinforced today.  The patient's status was assessed using clinical findings on exam and labs or diagnostic tests. The patient's success at meeting treatment goals on disease specific evidence-based guidelines and found to be well controlled. SELF MANAGEMENT: The patient and I together assessed ways to personally work towards obtaining the recommended goals. RECOMMENDATIONS: avoid decongestants found in common cold remedies, decrease consumption of alcohol, perform routine monitoring of BP with home BP cuff, exercise, reduction of dietary salt, take medicines as prescribed, try not to miss  doses and quit smoking.  Regular exercise and maintaining a healthy weight is needed.  Stress reduction may help. A CLINICAL SUMMARY including written plan identify barriers to care unique to individual due to social or financial issues.  We attempt to mutually creat solutions for individual and family understanding.      Relevant Medications   atorvastatin (LIPITOR) 40 MG tablet   metoprolol tartrate (LOPRESSOR) 50 MG tablet   Other Relevant Orders   CBC with Differential/Platelet   Comprehensive metabolic panel     Other   BACK PAIN, LUMBAR    He is seeing Dr. Samule Dry and to get MRI back.  He has history of spinal stenosis.      CHEST PAIN    Patient has intermittent chest pain and dysthymias wants to see cardiology.  Referred to cardiology      Relevant Orders   Ambulatory referral to Cardiology   Mixed hyperlipidemia    AN INDIVIDUAL CARE PLAN for hyperlipidemia/ cholesterol was established and reinforced today.  The patient's status was assessed using clinical findings on exam, lab and other diagnostic tests. The patient's disease status was assessed based on evidence-based guidelines and found to be well controlled. MEDICATIONS were renewed. SELF MANAGEMENT GOALS have been discussed and patient's success at attaining the goal of low cholesterol was assessed. RECOMMENDATION given include regular exercise 3 days a week and low cholesterol/low fat diet. CLINICAL SUMMARY including written plan to identify barriers unique to the patient due to social or economic  reasons was discussed.      Relevant Medications   atorvastatin (LIPITOR) 40 MG tablet   metoprolol tartrate (LOPRESSOR) 50 MG tablet   Other Relevant Orders   Lipid panel      Meds ordered this encounter  Medications  . atorvastatin (LIPITOR) 40 MG tablet    Sig: Take 1 tablet (40 mg total) by mouth daily.    Dispense:  90 tablet    Refill:  2  . metoprolol tartrate (LOPRESSOR) 50 MG tablet    Sig: Take 1 tablet  (50 mg total) by mouth 2 (two) times daily with a meal.    Dispense:  180 tablet    Refill:  2    Follow-up: Return in about 6 months (around 03/18/2020) for fasting.    Reinaldo Meeker, MD

## 2019-09-17 NOTE — Assessment & Plan Note (Signed)

## 2019-09-17 NOTE — Assessment & Plan Note (Signed)
Patient has intermittent chest pain and dysthymias wants to see cardiology.  Referred to cardiology

## 2019-09-18 LAB — CBC WITH DIFFERENTIAL/PLATELET
Basophils Absolute: 0.1 10*3/uL (ref 0.0–0.2)
Basos: 1 %
EOS (ABSOLUTE): 0.1 10*3/uL (ref 0.0–0.4)
Eos: 1 %
Hematocrit: 48.9 % (ref 37.5–51.0)
Hemoglobin: 17.5 g/dL (ref 13.0–17.7)
Immature Grans (Abs): 0 10*3/uL (ref 0.0–0.1)
Immature Granulocytes: 0 %
Lymphocytes Absolute: 2.1 10*3/uL (ref 0.7–3.1)
Lymphs: 23 %
MCH: 31.9 pg (ref 26.6–33.0)
MCHC: 35.8 g/dL — ABNORMAL HIGH (ref 31.5–35.7)
MCV: 89 fL (ref 79–97)
Monocytes Absolute: 0.8 10*3/uL (ref 0.1–0.9)
Monocytes: 9 %
Neutrophils Absolute: 6.1 10*3/uL (ref 1.4–7.0)
Neutrophils: 66 %
Platelets: 284 10*3/uL (ref 150–450)
RBC: 5.48 x10E6/uL (ref 4.14–5.80)
RDW: 13.1 % (ref 11.6–15.4)
WBC: 9.2 10*3/uL (ref 3.4–10.8)

## 2019-09-18 LAB — COMPREHENSIVE METABOLIC PANEL
ALT: 30 IU/L (ref 0–44)
AST: 21 IU/L (ref 0–40)
Albumin/Globulin Ratio: 2.1 (ref 1.2–2.2)
Albumin: 4.9 g/dL (ref 4.0–5.0)
Alkaline Phosphatase: 96 IU/L (ref 39–117)
BUN/Creatinine Ratio: 10 (ref 9–20)
BUN: 11 mg/dL (ref 6–24)
Bilirubin Total: 1.2 mg/dL (ref 0.0–1.2)
CO2: 25 mmol/L (ref 20–29)
Calcium: 10.2 mg/dL (ref 8.7–10.2)
Chloride: 102 mmol/L (ref 96–106)
Creatinine, Ser: 1.14 mg/dL (ref 0.76–1.27)
GFR calc Af Amer: 87 mL/min/{1.73_m2} (ref >59)
GFR calc non Af Amer: 76 mL/min/{1.73_m2} (ref >59)
Globulin, Total: 2.3 g/dL (ref 1.5–4.5)
Glucose: 94 mg/dL (ref 65–99)
Potassium: 4.9 mmol/L (ref 3.5–5.2)
Sodium: 141 mmol/L (ref 134–144)
Total Protein: 7.2 g/dL (ref 6.0–8.5)

## 2019-09-18 LAB — CARDIOVASCULAR RISK ASSESSMENT

## 2019-09-18 LAB — LIPID PANEL
Chol/HDL Ratio: 2.9 ratio (ref 0.0–5.0)
Cholesterol, Total: 139 mg/dL (ref 100–199)
HDL: 48 mg/dL (ref >39)
LDL Chol Calc (NIH): 73 mg/dL (ref 0–99)
Triglycerides: 99 mg/dL (ref 0–149)
VLDL Cholesterol Cal: 18 mg/dL (ref 5–40)

## 2019-09-18 NOTE — Progress Notes (Signed)
CBC normal, Kidney tests normal, Liver tests normal, cholesterol normal lp

## 2019-09-19 ENCOUNTER — Ambulatory Visit (INDEPENDENT_AMBULATORY_CARE_PROVIDER_SITE_OTHER): Payer: 59

## 2019-09-19 ENCOUNTER — Encounter: Payer: Self-pay | Admitting: Cardiology

## 2019-09-19 ENCOUNTER — Other Ambulatory Visit: Payer: Self-pay

## 2019-09-19 ENCOUNTER — Ambulatory Visit: Payer: 59 | Admitting: Cardiology

## 2019-09-19 VITALS — BP 120/88 | HR 67 | Ht 70.0 in | Wt 198.0 lb

## 2019-09-19 DIAGNOSIS — I471 Supraventricular tachycardia: Secondary | ICD-10-CM | POA: Diagnosis not present

## 2019-09-19 DIAGNOSIS — E782 Mixed hyperlipidemia: Secondary | ICD-10-CM | POA: Diagnosis not present

## 2019-09-19 DIAGNOSIS — I10 Essential (primary) hypertension: Secondary | ICD-10-CM

## 2019-09-19 DIAGNOSIS — R002 Palpitations: Secondary | ICD-10-CM | POA: Diagnosis not present

## 2019-09-19 NOTE — Progress Notes (Signed)
Cardiology Office Note:    Date:  09/19/2019   ID:  Lorine Bears, DOB 07/21/1970, MRN [id number]  PCP:  Lillard Anes, MD  Cardiologist:  Berniece Salines, DO  Electrophysiologist:  None   Referring MD: Lillard Anes,*   Chief Complaint  Patient presents with  . New Patient (Initial Visit)    History of Present Illness:    Kevin Jackson is a 49 y.o. male with a hx of hypertension, hyperlipidemia resents today to be evaluated for palpitations patient.  Had been expressing this problem for many years now.  He was started on Lopressor he says this seems to have helped.  But over the last year he has not been able to follow-up due to loss of insurance.  He tells me that he does have multiple episodes of palpitations throughout the day.  At times is a abrupt onset of fast heartbeat would last for seconds at a time prior to resolution.  Sometimes he feels his heart is skipping a beats multiple times and he feels it.  Most times is occurring at night or when he sitting still.  He denies chest pain, shortness of breath, nausea, vomiting.  He does have chronic back pain due to an injury he sustained at work.   Past Medical History:  Diagnosis Date  . BACK PAIN, LUMBAR 08/06/2009   Qualifier: Diagnosis of  By: Regino Schultze CMA (AAMA), Apolonio Schneiders    . CHEST PAIN 05/06/2010   Qualifier: Diagnosis of  By: Sherren Mocha MD, Jory Ee   . Chronic chest wall pain 08/28/2015  . GERD (gastroesophageal reflux disease)   . Hypertension   . Kidney stones   . Mixed hyperlipidemia 09/17/2019  . Neuralgia   . Numerous moles 01/19/2012  . 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 Supraventricular tachycardia ([place]) 09/17/2019    Past Surgical History:  Procedure Laterality Date  . APPENDECTOMY    . BACK SURGERY    . HERNIA REPAIR    . LAPAROSCOPIC GASTROTOMY W/ REPAIR OF ULCER    . right thoracotomy for exposure for the C7-T8 diskectomy  02/06/2007   Dr Arlyce Dice  . TONSILLECTOMY      Current  Medications: Current Meds  Medication Sig  . atorvastatin (LIPITOR) 40 MG tablet Take 1 tablet (40 mg total) by mouth daily.  . metoprolol tartrate (LOPRESSOR) 50 MG tablet Take 1 tablet (50 mg total) by mouth 2 (two) times daily with a meal.  . traMADol (ULTRAM) 50 MG tablet Take 1 tablet (50 mg total) by mouth every 12 (twelve) hours as needed.     Allergies:   Patient has no known allergies.   Social History   Socioeconomic History  . Marital status: Married    Spouse name: Not on file  . Number of children: Not on file  . Years of education: Not on file  . Highest education level: Not on file  Occupational History    Employer: [employer]  Tobacco Use  . Smoking status: Former Smoker    Types: Cigarettes    Quit date: 05/23/1997    Years since quitting: 22.3  . Smokeless tobacco: Current User    Types: Chew  Substance and Sexual Activity  . Alcohol use: Yes    Alcohol/week: 6.0 standard drinks    Types: 6 Cans of beer per week    Comment: Once a month  . Drug use: Not Currently  . Sexual activity: Yes  Other Topics Concern  . Not on file  Social History  Narrative  . Not on file   Social Determinants of Health   Financial Resource Strain:   . Difficulty of Paying Living Expenses:   Food Insecurity:   . Worried About Charity fundraiser in the Last Year:   . Arboriculturist in the Last Year:   Transportation Needs:   . Film/video editor (Medical):   Marland Kitchen Lack of Transportation (Non-Medical):   Physical Activity:   . Days of Exercise per Week:   . Minutes of Exercise per Session:   Stress:   . Feeling of Stress :   Social Connections:   . Frequency of Communication with Friends and Family:   . Frequency of Social Gatherings with Friends and Family:   . Attends Religious Services:   . Active Member of Clubs or Organizations:   . Attends Archivist Meetings:   Marland Kitchen Marital Status:      Family History: The patient's family history includes Asthma  in his mother; Cancer in his paternal uncle; Healthy in his brother, sister, and son; Hypertension in his mother; Other in his brother; Scoliosis in his daughter.  ROS:   Review of Systems  Constitution: Negative for decreased appetite, fever and weight gain.  HENT: Negative for congestion, ear discharge, hoarse voice and sore throat.   Eyes: Negative for discharge, redness, vision loss in right eye and visual halos.  Cardiovascular: Reports palpitations.  Negative for chest pain, dyspnea on exertion, leg swelling, orthopnea and palpitations.  Respiratory: Negative for cough, hemoptysis, shortness of breath and snoring.   Endocrine: Negative for heat intolerance and polyphagia.  Hematologic/Lymphatic: Negative for bleeding problem. Does not bruise/bleed easily.  Skin: Negative for flushing, nail changes, rash and suspicious lesions.  Musculoskeletal: Negative for arthritis, joint pain, muscle cramps, myalgias, neck pain and stiffness.  Gastrointestinal: Negative for abdominal pain, bowel incontinence, diarrhea and excessive appetite.  Genitourinary: Negative for decreased libido, genital sores and incomplete emptying.  Neurological: Negative for brief paralysis, focal weakness, headaches and loss of balance.  Psychiatric/Behavioral: Negative for altered mental status, depression and suicidal ideas.  Allergic/Immunologic: Negative for HIV exposure and persistent infections.    EKGs/Labs/Other Studies Reviewed:    The following studies were reviewed today:   EKG:  The ekg ordered today demonstrates sinus rhythm, heart rate 67 bpm, P wave morphology suggesting left atrial enlargement.  Transthoracic echocardiogram in April 2017 Study Conclusions   - Left ventricle: Global LV longitudinal strain is normal at -21.1%  The cavity size was normal. Systolic function was normal. The  estimated ejection fraction was in the range of 60% to 65%. Wall  motion was normal; there were no  regional wall motion  abnormalities. There was an increased relative contribution of  atrial contraction to ventricular filling. Doppler parameters are  consistent with abnormal left ventricular relaxation (grade 1  diastolic dysfunction).  - Mitral valve: There was mild regurgitation.  - Right ventricle: The cavity size was mildly dilated. Wall thickness was normal.   Exercise tolerance test in March 2017  There was no ST segment deviation noted during stress.  No T wave inversion was noted during stress.  Negative, adequate stress test.  Recent Labs: 09/17/2019: ALT 30; BUN 11; Creatinine, Ser 1.14; Hemoglobin 17.5; Platelets 284; Potassium 4.9; Sodium 141  Recent Lipid Panel    Component Value Date/Time   CHOL 139 09/17/2019 0927   TRIG 99 09/17/2019 0927   HDL 48 09/17/2019 0927   CHOLHDL 2.9 09/17/2019 0927   LDLCALC  73 09/17/2019 0927    Physical Exam:    VS:  BP 120/88   Pulse 67   Ht 5\' 10"  (1.778 m)   Wt 198 lb (89.8 kg)   SpO2 96%   BMI 28.41 kg/m     Wt Readings from Last 3 Encounters:  09/19/19 198 lb (89.8 kg)  09/17/19 198 lb 6.4 oz (90 kg)  09/04/19 204 lb (92.5 kg)     GEN: Well nourished, well developed in no acute distress HEENT: Normal NECK: No JVD; No carotid bruits LYMPHATICS: No lymphadenopathy CARDIAC: S1S2 noted,RRR, no murmurs, rubs, gallops RESPIRATORY:  Clear to auscultation without rales, wheezing or rhonchi  ABDOMEN: Soft, non-tender, non-distended, +bowel sounds, no guarding. EXTREMITIES: No edema, No cyanosis, no clubbing MUSCULOSKELETAL:  No deformity  SKIN: Warm and dry NEUROLOGIC:  Alert and oriented x 3, non-focal PSYCHIATRIC:  Normal affect, good insight  ASSESSMENT:    1. Palpitations   2. Essential hypertension   3. Supraventricular tachycardia ([place])   4. Mixed hyperlipidemia    PLAN:    His palpitations is worsening and the symptoms are very bothersome.  At this time I would like to rule out a  cardiovascular etiology of this palpitation, therefore at this time I would like to placed a zio patch for   14 days.  All his questions about the Zio patch were answered during the office visit.  In additon a transthoracic echocardiogram also with a review his recent echocardiogram which does not show any structural abnormalities.  His LV function was normal at that time.,  However with grade 1 diastolic dysfunction.  No need for repeat.  He also did have exercise so a stress test was did not show any exercise-induced arrhythmia.  Hypertension-his blood pressure deceptively in the office today. Paroxysmal SVT we will continue patient on his Lopressor.  Hyperlipidemia continue patient on his statin 40 mg daily.  Tobacco use - cessation advised.   The patient is in agreement with the above plan. The patient left the office in stable condition.  The patient will follow up in 3 months or sooner if needed   Medication Adjustments/Labs and Tests Ordered: Current medicines are reviewed at length with the patient today.  Concerns regarding medicines are outlined above.  No orders of the defined types were placed in this encounter.  No orders of the defined types were placed in this encounter.   There are no Patient Instructions on file for this visit.   Adopting a Healthy Lifestyle.  Know what a healthy weight is for you (roughly BMI <25) and aim to maintain this   Aim for 7+ servings of fruits and vegetables daily   65-80+ fluid ounces of water or unsweet tea for healthy kidneys   Limit to max 1 drink of alcohol per day; avoid smoking/tobacco   Limit animal fats in diet for cholesterol and heart health - choose grass fed whenever available   Avoid highly processed foods, and foods high in saturated/trans fats   Aim for low stress - take time to unwind and care for your mental health   Aim for 150 min of moderate intensity exercise weekly for heart health, and weights twice weekly for  bone health   Aim for 7-9 hours of sleep daily   When it comes to diets, agreement about the perfect plan isnt easy to find, even among the experts. Experts at the [employer] developed an idea known as the Healthy Eating Plate. Just  imagine a plate divided into logical, healthy portions.   The emphasis is on diet quality:   Load up on vegetables and fruits - one-half of your plate: Aim for color and variety, and remember that potatoes dont count.   Go for whole grains - one-quarter of your plate: Whole wheat, barley, wheat berries, quinoa, oats, brown rice, and foods made with them. If you want pasta, go with whole wheat pasta.   Protein power - one-quarter of your plate: Fish, chicken, beans, and nuts are all healthy, versatile protein sources. Limit red meat.   The diet, however, does go beyond the plate, offering a few other suggestions.   Use healthy plant oils, such as olive, canola, soy, corn, sunflower and peanut. Check the labels, and avoid partially hydrogenated oil, which have unhealthy trans fats.   If youre thirsty, drink water. Coffee and tea are good in moderation, but skip sugary drinks and limit milk and dairy products to one or two daily servings.   The type of carbohydrate in the diet is more important than the amount. Some sources of carbohydrates, such as vegetables, fruits, whole grains, and beans-are healthier than others.   Finally, stay active  Signed, Berniece Salines, DO  09/19/2019 10:19 AM    [place]

## 2019-09-19 NOTE — Patient Instructions (Signed)
Medication Instructions:  Your physician recommends that you continue on your current medications as directed. Please refer to the Current Medication list given to you today.  *If you need a refill on your cardiac medications before your next appointment, please call your pharmacy*   Lab Work: None If you have labs (blood work) drawn today and your tests are completely normal, you will receive your results only by: . MyChart Message (if you have MyChart) OR . A paper copy in the mail If you have any lab test that is abnormal or we need to change your treatment, we will call you to review the results.   Testing/Procedures: A zio monitor was ordered today. It will remain on for 14 days. You will then return monitor and event diary in provided box. It takes 1-2 weeks for report to be downloaded and returned to us. We will call you with the results. If monitor falls off or has orange flashing light, please call Zio for further instructions.    Follow-Up: At [facility], you and your health needs are our priority.  As part of our continuing mission to provide you with exceptional heart care, we have created designated Provider Care Teams.  These Care Teams include your primary Cardiologist (physician) and Advanced Practice Providers (APPs -  Physician Assistants and Nurse Practitioners) who all work together to provide you with the care you need, when you need it.  We recommend signing up for the patient portal called "MyChart".  Sign up information is provided on this After Visit Summary.  MyChart is used to connect with patients for Virtual Visits (Telemedicine).  Patients are able to view lab/test results, encounter notes, upcoming appointments, etc.  Non-urgent messages can be sent to your provider as well.   To learn more about what you can do with MyChart, go to [email or web address].    Your next appointment:   3 month(s)  The format for your next appointment:   In  Person  Provider:   Kardie Tobb, DO   Other Instructions   

## 2019-09-23 ENCOUNTER — Other Ambulatory Visit: Payer: Self-pay

## 2019-09-23 ENCOUNTER — Ambulatory Visit (INDEPENDENT_AMBULATORY_CARE_PROVIDER_SITE_OTHER): Payer: Worker's Compensation | Admitting: Orthopaedic Surgery

## 2019-09-23 ENCOUNTER — Encounter: Payer: Self-pay | Admitting: Orthopaedic Surgery

## 2019-09-23 DIAGNOSIS — M5126 Other intervertebral disc displacement, lumbar region: Secondary | ICD-10-CM | POA: Diagnosis not present

## 2019-09-23 NOTE — Progress Notes (Signed)
Office Visit Note   [name]           Date of Birth: 08/12/1970           MRN: [id number] Visit Date: 09/23/2019              Requested by: Lillard Anes, MD [address] [place],  [place] [address] PCP: Lillard Anes, MD   Assessment & Plan: Visit Diagnoses:  1. Protrusion of lumbar intervertebral disc            Right L3-4 by MRI 2012 and MRI 2021.  Plan: We will patient to resume work activity no lifting greater than 35 pounds.  I will recheck him in a month.  We discussed he could have injection done right extraforaminal L3-4.  Previous injections gave him temporary relief for week or 2 as he recalls back in 2012 with Dr. Herma Mering.  We reviewed the MRI imaging from 2012 and his new MRI scan.  I will check him in a month he can decide if he like to try single injection.  This point I discussed and operative intervention is not needed and hopefully his symptoms will settle down some.  We reviewed MRI report from 2012 as well as images and I gave a copy of his new MRI scan from 09/18/2019.  Follow-Up Instructions: Return in about 1 month (around 10/24/2019).   Orders:  No orders of the defined types were placed in this encounter.  No orders of the defined types were placed in this encounter.     Procedures: No procedures performed   Clinical Data: No additional findings.   Subjective: Chief Complaint  Patient presents with  . Lower Back - Pain, Follow-up    MRI Lumbar Review    HPI 49 year old male returns for follow-up on-the-job injury for his lumbar spine 07/26/2019.  He was picking up something heavy he had back pain and has had right buttocks pain since that time.  Past history of problems with his back previous MRI 2012 and past history of epidurals done by Dr. Herma Mering.  Patient had a history of thoracic herniation T7.  Patient did not have any lumbar surgery.  New MRI scan has been obtained and is available for  review.  MRI done at [facility] done on 09/18/2019 impression is disc degeneration at L3-4 with right greater than left foraminal stenosis probable right foraminal disc protrusion crowding the right L3 nerve root.  Incidental findings are multiple renal cysts.  Comparison to 2012 MRI reviewed on PACS shows near identical findings at the L3-4 level with some right foraminal extraforaminal disc protrusion.  Patient describes increased symptoms when he does a Valsalva or strains.  Discomfort with turning and twisting.  Review of Systems review of systems updated unchanged from 09/04/2019 office visit.   Objective: Vital Signs: BP (!) 134/91   Pulse 70   Ht 5\' 10"  (1.778 m)   Wt 198 lb (89.8 kg)   BMI 28.41 kg/m   Physical Exam Constitutional:      Appearance: He is well-developed.  HENT:     Head: Normocephalic and atraumatic.  Eyes:     Pupils: Pupils are equal, round, and reactive to light.  Neck:     Thyroid: No thyromegaly.     Trachea: No tracheal deviation.  Cardiovascular:     Rate and Rhythm: Normal rate.  Pulmonary:     Effort: Pulmonary effort is normal.     Breath  sounds: No wheezing.  Abdominal:     General: Bowel sounds are normal.     Palpations: Abdomen is soft.  Skin:    General: Skin is warm and dry.     Capillary Refill: Capillary refill takes less than 2 seconds.  Neurological:     Mental Status: He is alert and oriented to person, place, and time.  Psychiatric:        Behavior: Behavior normal.        Thought Content: Thought content normal.        Judgment: Judgment normal.     Ortho Exam patient has normal skin of the lumbar spine he points directly over the SI joint area where he has pain with bending or twisting.  Pain radiates in his buttocks does not radiate past the knee. Normal heel toe gait.  Plantar flexion dorsiflexion ankles are strong. Specialty Comments:  No specialty comments available.  Imaging: No results found.   PMFS  History: Patient Active Problem List   Diagnosis Date Noted  . 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  . Mixed hyperlipidemia 09/17/2019  . Supraventricular tachycardia ([place]) 09/17/2019  . Chronic chest wall pain 08/28/2015  . 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 Numerous moles 01/19/2012  . Hypertension 03/17/2011  . CHEST PAIN 05/06/2010  . BACK PAIN, LUMBAR 08/06/2009   Past Medical History:  Diagnosis Date  . BACK PAIN, LUMBAR 08/06/2009   Qualifier: Diagnosis of  By: Regino Schultze CMA (AAMA), Apolonio Schneiders    . CHEST PAIN 05/06/2010   Qualifier: Diagnosis of  By: Sherren Mocha MD, Jory Ee   . Chronic chest wall pain 08/28/2015  . GERD (gastroesophageal reflux disease)   . Hypertension   . Kidney stones   . Mixed hyperlipidemia 09/17/2019  . Neuralgia   . Numerous moles 01/19/2012  . 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 Supraventricular tachycardia ([place]) 09/17/2019    Family History  Problem Relation Age of Onset  . Hypertension Mother   . Asthma Mother   . Cancer Paternal Uncle        intestinal  . Healthy Sister   . Healthy Brother   . Scoliosis Daughter   . Healthy Son   . Other Brother        tumors    Past Surgical History:  Procedure Laterality Date  . APPENDECTOMY    . BACK SURGERY    . HERNIA REPAIR    . LAPAROSCOPIC GASTROTOMY W/ REPAIR OF ULCER    . right thoracotomy for exposure for the C7-T8 diskectomy  02/06/2007   Dr Arlyce Dice  . TONSILLECTOMY     Social History   Occupational History    Employer: [employer]  Tobacco Use  . Smoking status: Former Smoker    Types: Cigarettes    Quit date: 05/23/1997    Years since quitting: 22.3  . Smokeless tobacco: Current User    Types: Chew  Substance and Sexual Activity  . Alcohol use: Yes    Alcohol/week: 6.0 standard drinks    Types: 6 Cans of beer per week    Comment: Once a month  . Drug use: Not Currently  . Sexual activity: Yes

## 2019-09-24 ENCOUNTER — Other Ambulatory Visit: Payer: Self-pay | Admitting: Legal Medicine

## 2019-09-24 ENCOUNTER — Encounter: Payer: Self-pay | Admitting: Legal Medicine

## 2019-09-24 MED ORDER — CITALOPRAM HYDROBROMIDE 20 MG PO TABS: 20.0000 mg | ORAL_TABLET | Freq: Every day | ORAL | 3 refills | Status: DC

## 2019-10-01 ENCOUNTER — Other Ambulatory Visit: Payer: 59

## 2019-10-29 ENCOUNTER — Ambulatory Visit (INDEPENDENT_AMBULATORY_CARE_PROVIDER_SITE_OTHER): Payer: Worker's Compensation | Admitting: Orthopaedic Surgery

## 2019-10-29 ENCOUNTER — Other Ambulatory Visit: Payer: Self-pay

## 2019-10-29 ENCOUNTER — Encounter: Payer: Self-pay | Admitting: Orthopaedic Surgery

## 2019-10-29 VITALS — BP 129/89 | HR 70 | Ht 70.0 in | Wt 192.0 lb

## 2019-10-29 DIAGNOSIS — M5126 Other intervertebral disc displacement, lumbar region: Secondary | ICD-10-CM | POA: Diagnosis not present

## 2019-10-29 NOTE — Progress Notes (Signed)
Office Visit Note   [name]           Date of Birth: [birth year]           MRN: [id number] Visit Date: 10/29/2019              Requested by: Lillard Anes, MD [address] [place],  [place] [address] PCP: Lillard Anes, MD   Assessment & Plan: Visit Diagnoses: lumbar disc protrusion                             Right L3-4  Plan: Patient had previous lumbar MRI done at [facility] with disc degeneration L3-4 right greater than left foraminal stenosis and tiny right disc protrusion crowding the right L3 nerve root.  I recommend proceeding with single epidural injection.  Work slip given for continue work restriction no lifting greater than 35 pounds.  He can return to see me a couple weeks after the injection.  Follow-Up Instructions: No follow-ups on file.   Orders:  No orders of the defined types were placed in this encounter.  No orders of the defined types were placed in this encounter.     Procedures: No procedures performed   Clinical Data: No additional findings.   Subjective: Chief Complaint  Patient presents with  . Lower Back - Pain    OTJI 07/26/2019    HPI 49 year old male returns for follow-up of on-the-job injury 07/26/2019.  Patient states after several hours at work on his feet he is having increased pain and discomfort.  We discussed the previous MRI which showed small disc protrusion with extraforaminal disc and foraminal narrowing worse on the right than left.  He had previous scan about 9 years ago and had some epidurals with relief he described probably about a week.  It continues to bother him and he states when he turns and tilts to the side he feels pain that shoots down his right leg down to his right foot and has to put pressure on his left leg when he standing.  Last visit we discussed possible a single epidural injection.  Past history of T7 thoracic disc herniation in about 2008 surgically treated.   He states still has some rib pain on the right side sometimes it radiates to his xiphoid with activities.  Review of Systems update unchanged from 09/23/2019 other than as mentioned HPI.   Objective: Vital Signs: BP 129/89   Pulse 70   Ht 5\' 10"  (1.778 m)   Wt 192 lb (87.1 kg)   BMI 27.55 kg/m   Physical Exam Constitutional:      Appearance: He is well-developed.  HENT:     Head: Normocephalic and atraumatic.  Eyes:     Pupils: Pupils are equal, round, and reactive to light.  Neck:     Thyroid: No thyromegaly.     Trachea: No tracheal deviation.  Cardiovascular:     Rate and Rhythm: Normal rate.  Pulmonary:     Effort: Pulmonary effort is normal.     Breath sounds: No wheezing.  Abdominal:     General: Bowel sounds are normal.     Palpations: Abdomen is soft.  Skin:    General: Skin is warm and dry.     Capillary Refill: Capillary refill takes less than 2 seconds.  Neurological:     Mental Status: He is alert and oriented to person, place, and time.  Psychiatric:        Behavior: Behavior normal.        Thought Content: Thought content normal.        Judgment: Judgment normal.     Ortho Exam no lumbar incision skin her lumbar spine is normal.  Increased pain points to the sacroiliac region on the right with bending to the right and forward flexion.  Normal heel toe gait.  No ankle dorsiflexion plantarflexion deficit.  He has some pain with reverse straight leg raising right negative left.  Negative logroll to the hips.  Specialty Comments:  No specialty comments available.  Imaging: No results found.   PMFS History: Patient Active Problem List   Diagnosis Date Noted  . 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  . Mixed hyperlipidemia 09/17/2019  . Supraventricular tachycardia ([place]) 09/17/2019  . Chronic chest wall pain 08/28/2015  . 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 Numerous moles 01/19/2012  . Hypertension 03/17/2011  . CHEST PAIN 05/06/2010    . BACK PAIN, LUMBAR 08/06/2009   Past Medical History:  Diagnosis Date  . BACK PAIN, LUMBAR 08/06/2009   Qualifier: Diagnosis of  By: Regino Schultze CMA (AAMA), Apolonio Schneiders    . CHEST PAIN 05/06/2010   Qualifier: Diagnosis of  By: Sherren Mocha MD, Jory Ee   . Chronic chest wall pain 08/28/2015  . GERD (gastroesophageal reflux disease)   . Hypertension   . Kidney stones   . Mixed hyperlipidemia 09/17/2019  . Neuralgia   . Numerous moles 01/19/2012  . 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 Supraventricular tachycardia ([place]) 09/17/2019    Family History  Problem Relation Age of Onset  . Hypertension Mother   . Asthma Mother   . Cancer Paternal Uncle        intestinal  . Healthy Sister   . Healthy Brother   . Scoliosis Daughter   . Healthy Son   . Other Brother        tumors    Past Surgical History:  Procedure Laterality Date  . APPENDECTOMY    . BACK SURGERY    . HERNIA REPAIR    . LAPAROSCOPIC GASTROTOMY W/ REPAIR OF ULCER    . right thoracotomy for exposure for the C7-T8 diskectomy  02/06/2007   Dr Arlyce Dice  . TONSILLECTOMY     Social History   Occupational History    Employer: [employer]  Tobacco Use  . Smoking status: Former Smoker    Types: Cigarettes    Quit date: 05/23/1997    Years since quitting: 22.4  . Smokeless tobacco: Current User    Types: Chew  Substance and Sexual Activity  . Alcohol use: Yes    Alcohol/week: 6.0 standard drinks    Types: 6 Cans of beer per week    Comment: Once a month  . Drug use: Not Currently  . Sexual activity: Yes

## 2019-10-30 MED ORDER — DILTIAZEM HCL 120 MG PO TABS: 120.0000 mg | ORAL_TABLET | Freq: Every day | ORAL | 3 refills | Status: DC

## 2019-11-06 ENCOUNTER — Encounter: Payer: Self-pay | Admitting: Orthopaedic Surgery

## 2019-11-11 ENCOUNTER — Encounter: Payer: Self-pay | Admitting: Family Medicine

## 2019-11-11 ENCOUNTER — Ambulatory Visit: Payer: 59 | Admitting: Family Medicine

## 2019-11-11 ENCOUNTER — Ambulatory Visit: Payer: Self-pay

## 2019-11-11 ENCOUNTER — Other Ambulatory Visit: Payer: Self-pay

## 2019-11-11 DIAGNOSIS — M546 Pain in thoracic spine: Secondary | ICD-10-CM

## 2019-11-11 DIAGNOSIS — G8929 Other chronic pain: Secondary | ICD-10-CM | POA: Diagnosis not present

## 2019-11-11 NOTE — Progress Notes (Signed)
Office Visit Note   [name]           Date of Birth: Jun 26, 1970           MRN: [id number] Visit Date: 11/11/2019 Requested by: Lillard Anes, MD [address] [place],  [place] [address] PCP: Lillard Anes, MD  Subjective: Chief Complaint  Patient presents with  . Middle Back - Pain    Pain between shoulder blades, worsening x last 6 months. Has had this intermittently x years. H/o surgery at T7 2008 (Dr. Annette Stable). Takes a while to fall asleep due to pain.    HPI: He is here with chronic thoracic pain.  He is a former patient of mine.  I saw him years ago for a thoracic disc herniation.  Ultimately about 8 years ago he underwent surgery per Dr. Annette Stable.  It was not a fusion surgery.  Unfortunately he never got relief.  He has had epidural injections, physical therapy, chiropractic.  He has tried back bracing.  Years ago he had a second opinion per Dr. Louanne Skye who felt that he might benefit from a fusion.  Patient states that he has been dealing with his pain, but is interested in meeting with Dr. Louanne Skye again to discuss that possibility.  He states that he has pain shooting into the anterior chest sometimes, and he was right lower rib will occasionally "pop out" and visibly look deformed, and he has to push it back in place.  He has constant sensitivity near the xiphoid process.  Unrelated to this, he is working with Dr. Inda Merlin for a work-related injury to the lumbar spine.  We are not addressing that today.                ROS:   All other systems were reviewed and are negative.  Objective: Vital Signs: There were no vitals taken for this visit.  Physical Exam:  General:  Alert and oriented, in no acute distress. Pulm:  Breathing unlabored. Psy:  Normal mood, congruent affect  Thoracic back: He has tenderness to the right of midline at around the T7-8 level in the paraspinous muscles.  He is also tender anteriorly over the xiphoid process.   His ribs do not feel sublux this morning.  Imaging: XR Thoracic Spine 2 View  Result Date: 11/11/2019 X-rays thoracic spine reveal mild degenerative disc disease.  No sign of compression deformity or neoplasm.   Assessment & Plan: 1.  Chronic right-sided thoracic pain -Elected to make referral to Dr. Louanne Skye for consideration of thoracic fusion.  He will follow-up based on Dr. Otho Ket recommendation.     Procedures: No procedures performed  No notes on file     PMFS History: Patient Active Problem List   Diagnosis Date Noted  . 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  . Mixed hyperlipidemia 09/17/2019  . Supraventricular tachycardia ([place]) 09/17/2019  . Chronic chest wall pain 08/28/2015  . 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 Numerous moles 01/19/2012  . Hypertension 03/17/2011  . CHEST PAIN 05/06/2010  . BACK PAIN, LUMBAR 08/06/2009   Past Medical History:  Diagnosis Date  . BACK PAIN, LUMBAR 08/06/2009   Qualifier: Diagnosis of  By: Regino Schultze CMA (AAMA), Apolonio Schneiders    . CHEST PAIN 05/06/2010   Qualifier: Diagnosis of  By: Sherren Mocha MD, Jory Ee   . Chronic chest wall pain 08/28/2015  . GERD (gastroesophageal reflux disease)   . Hypertension   . Kidney stones   .  Mixed hyperlipidemia 09/17/2019  . Neuralgia   . Numerous moles 01/19/2012  . 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 Supraventricular tachycardia ([place]) 09/17/2019    Family History  Problem Relation Age of Onset  . Hypertension Mother   . Asthma Mother   . Cancer Paternal Uncle        intestinal  . Healthy Sister   . Healthy Brother   . Scoliosis Daughter   . Healthy Son   . Other Brother        tumors    Past Surgical History:  Procedure Laterality Date  . APPENDECTOMY    . BACK SURGERY    . HERNIA REPAIR    . LAPAROSCOPIC GASTROTOMY W/ REPAIR OF ULCER    . right thoracotomy for exposure for the C7-T8 diskectomy  02/06/2007   Dr Arlyce Dice  . TONSILLECTOMY     Social History   Occupational History     Employer:  [employer]  Tobacco Use  . Smoking status: Former Smoker    Types: Cigarettes    Quit date: 05/23/1997    Years since quitting: 22.4  . Smokeless tobacco: Current User    Types: Chew  Vaping Use  . Vaping Use: Never used  Substance and Sexual Activity  . Alcohol use: Yes    Alcohol/week: 6.0 standard drinks    Types: 6 Cans of beer per week    Comment: Once a month  . Drug use: Not Currently  . Sexual activity: Yes

## 2019-11-22 ENCOUNTER — Other Ambulatory Visit: Payer: Self-pay

## 2019-11-22 ENCOUNTER — Ambulatory Visit: Payer: 59 | Admitting: Cardiology

## 2019-11-22 ENCOUNTER — Encounter: Payer: Self-pay | Admitting: Cardiology

## 2019-11-22 VITALS — BP 116/86 | HR 76 | Ht 70.0 in | Wt 194.6 lb

## 2019-11-22 DIAGNOSIS — R5383 Other fatigue: Secondary | ICD-10-CM | POA: Diagnosis not present

## 2019-11-22 DIAGNOSIS — I493 Ventricular premature depolarization: Secondary | ICD-10-CM | POA: Diagnosis not present

## 2019-11-22 DIAGNOSIS — I471 Supraventricular tachycardia: Secondary | ICD-10-CM

## 2019-11-22 DIAGNOSIS — I10 Essential (primary) hypertension: Secondary | ICD-10-CM

## 2019-11-22 DIAGNOSIS — E782 Mixed hyperlipidemia: Secondary | ICD-10-CM

## 2019-11-22 DIAGNOSIS — R400 Somnolence: Secondary | ICD-10-CM | POA: Diagnosis not present

## 2019-11-22 DIAGNOSIS — Z720 Tobacco use: Secondary | ICD-10-CM

## 2019-11-22 NOTE — Progress Notes (Signed)
Cardiology Office Note:    Date:  11/22/2019   ID:  Kevin Jackson, DOB 03-31-1971, MRN [id number]  PCP:  Lillard Anes, MD  Cardiologist:  Berniece Salines, DO  Electrophysiologist:  None   Referring MD: Lillard Anes,*   "I am so tired, fatigue and feeling of sleepiness during the day"  History of Present Illness:    Kevin Jackson is a 49 y.o. male with a hx of hypertension, hyperlipidemia who presented to be evaluated for palpitations.  At the conclusion of his visit patient, I placed a ZIO monitor on the patient.  Also Reviewed his prior echocardiogram which was April 2017.  In the interim the patient the patient did wear a monitor which showed evidence symptomatic premature ventricular complex as well as paroxysmal atrial tachycardia.  He has been on Cardizem 120 mg which he tells me has helped tremendously.  Today he reports that over the last several months he has been experiencing fatigue, and daytime somnolence.  Past Medical History:  Diagnosis Date   BACK PAIN, LUMBAR 08/06/2009   Qualifier: Diagnosis of  By: Regino Schultze CMA Deborra Medina), Rachel     CHEST PAIN 05/06/2010   Qualifier: Diagnosis of  By: Sherren Mocha MD, Dellis Filbert A    Chronic chest wall pain 08/28/2015   GERD (gastroesophageal reflux disease)    Hypertension    Kidney stones    Mixed hyperlipidemia 09/17/2019   Neuralgia    Numerous moles 01/19/2012   Shortened PR interval 08/01/2015   Supraventricular tachycardia ([place]) 09/17/2019    Past Surgical History:  Procedure Laterality Date   APPENDECTOMY     BACK SURGERY     HERNIA REPAIR     LAPAROSCOPIC GASTROTOMY W/ REPAIR OF ULCER     right thoracotomy for exposure for the C7-T8 diskectomy  02/06/2007   Dr Arlyce Dice   TONSILLECTOMY      Current Medications: Current Meds  Medication Sig   atorvastatin (LIPITOR) 40 MG tablet Take 1 tablet (40 mg total) by mouth daily.   citalopram (CELEXA) 20 MG tablet Take 1 tablet (20 mg  total) by mouth daily.   diltiazem (CARDIZEM) 120 MG tablet Take 1 tablet (120 mg total) by mouth daily.   traMADol (ULTRAM) 50 MG tablet Take 1 tablet (50 mg total) by mouth every 12 (twelve) hours as needed.     Allergies:   Patient has no known allergies.   Social History   Socioeconomic History   Marital status: Married    Spouse name: Not on file   Number of children: Not on file   Years of education: Not on file   Highest education level: Not on file  Occupational History    Employer: [employer]  Tobacco Use   Smoking status: Former Smoker    Types: Cigarettes    Quit date: 05/23/1997    Years since quitting: 22.5   Smokeless tobacco: Current User    Types: Chew  Vaping Use   Vaping Use: Never used  Substance and Sexual Activity   Alcohol use: Yes    Alcohol/week: 6.0 standard drinks    Types: 6 Cans of beer per week    Comment: Once a month   Drug use: Not Currently   Sexual activity: Yes  Other Topics Concern   Not on file  Social History Narrative   Not on file   Social Determinants of Health   Financial Resource Strain:    Difficulty of Paying Living Expenses:   Food Insecurity:  Worried About Charity fundraiser in the Last Year:    Arboriculturist in the Last Year:   Transportation Needs:    Film/video editor (Medical):    Lack of Transportation (Non-Medical):   Physical Activity:    Days of Exercise per Week:    Minutes of Exercise per Session:   Stress:    Feeling of Stress :   Social Connections:    Frequency of Communication with Friends and Family:    Frequency of Social Gatherings with Friends and Family:    Attends Religious Services:    Active Member of Clubs or Organizations:    Attends Archivist Meetings:    Marital Status:      Family History: The patient's family history includes Asthma in his mother; Cancer in his paternal uncle; Healthy in his brother, sister, and son; Hypertension  in his mother; Other in his brother; Scoliosis in his daughter.  ROS:   Review of Systems  Constitution: Negative for decreased appetite, fever and weight gain.  HENT: Negative for congestion, ear discharge, hoarse voice and sore throat.   Eyes: Negative for discharge, redness, vision loss in right eye and visual halos.  Cardiovascular: Negative for chest pain, dyspnea on exertion, leg swelling, orthopnea and palpitations.  Respiratory: Negative for cough, hemoptysis, shortness of breath and snoring.   Endocrine: Negative for heat intolerance and polyphagia.  Hematologic/Lymphatic: Negative for bleeding problem. Does not bruise/bleed easily.  Skin: Negative for flushing, nail changes, rash and suspicious lesions.  Musculoskeletal: Negative for arthritis, joint pain, muscle cramps, myalgias, neck pain and stiffness.  Gastrointestinal: Negative for abdominal pain, bowel incontinence, diarrhea and excessive appetite.  Genitourinary: Negative for decreased libido, genital sores and incomplete emptying.  Neurological: Negative for brief paralysis, focal weakness, headaches and loss of balance.  Psychiatric/Behavioral: Negative for altered mental status, depression and suicidal ideas.  Allergic/Immunologic: Negative for HIV exposure and persistent infections.    EKGs/Labs/Other Studies Reviewed:    The following studies were reviewed today:   EKG: None today  Zio monitor The patient wore the monitor for 13 days 21 hours starting September 19, 2019 . Indication: Palpitations The minimum heart rate was 48 bpm, maximum heart rate was 179 bpm, and average heart rate was 72 bpm. Predominant underlying rhythm was Sinus Rhythm.  3 Supraventricular Tachycardia runs occurred, the run with the fastest interval lasting 13.6 seconds with a maximum rate of 179 bpm (average 148 bpm); the run with the fastest interval was also the longest.  Premature atrial complexes were rare (less than 1%). Premature  Ventricular complexes were rare (less than 1%).  No ventricular tachycardia, pauses, No AV block and no atrial fibrillation present.  101 patient triggered event noted all associated with premature ventricular complex.  37 diary event noted 1 associated with supraventricular tachycardia and the remaining associated with premature ventricular complex.  Conclusion: This study is remarkable for the following:                              1.  Symptomatic premature ventricular complex                             2.  Symptomatic paroxysmal SVT, which is likely atrial tachycardia with variable block   Recent Labs: 09/17/2019: ALT 30; BUN 11; Creatinine, Ser 1.14; Hemoglobin 17.5; Platelets 284; Potassium 4.9; Sodium 141  Recent Lipid Panel    Component Value Date/Time   CHOL 139 09/17/2019 0927   TRIG 99 09/17/2019 0927   HDL 48 09/17/2019 0927   CHOLHDL 2.9 09/17/2019 0927   LDLCALC 73 09/17/2019 0927    Physical Exam:    VS:  BP 116/86    Pulse 76    Ht 5\' 10"  (1.778 m)    Wt 194 lb 9.6 oz (88.3 kg)    SpO2 98%    BMI 27.92 kg/m     Wt Readings from Last 3 Encounters:  11/22/19 194 lb 9.6 oz (88.3 kg)  10/29/19 192 lb (87.1 kg)  09/23/19 198 lb (89.8 kg)     GEN: Well nourished, well developed in no acute distress HEENT: Normal NECK: No JVD; No carotid bruits LYMPHATICS: No lymphadenopathy CARDIAC: S1S2 noted,RRR, no murmurs, rubs, gallops RESPIRATORY:  Clear to auscultation without rales, wheezing or rhonchi  ABDOMEN: Soft, non-tender, non-distended, +bowel sounds, no guarding. EXTREMITIES: No edema, No cyanosis, no clubbing MUSCULOSKELETAL:  No deformity  SKIN: Warm and dry NEUROLOGIC:  Alert and oriented x 3, non-focal PSYCHIATRIC:  Normal affect, good insight  ASSESSMENT:    1. Fatigue, unspecified type   2. Daytime somnolence    PLAN:     1. He has had improved symptoms but notes that there may be some breakthrough palpitations. We will hold off on  increasing his medication in the setting of his improved symptom and lower blood pressure. We will continue to monitor.   2.  His most bothersome symptom today is the fact that he has daytime somnolence- vitamin D level will be done today as well as set up for sleep study to rule out OSA.   Hyperlipidemia continue patient on his statin 40 mg daily.  htn blood pressure acceptable   The patient is in agreement with the above plan. The patient left the office in stable condition.  The patient will follow up in 3 months   Medication Adjustments/Labs and Tests Ordered: Current medicines are reviewed at length with the patient today.  Concerns regarding medicines are outlined above.  Orders Placed This Encounter  Procedures   Vitamin D (25 hydroxy)   Ambulatory referral to Sleep Studies   No orders of the defined types were placed in this encounter.   Patient Instructions  Medication Instructions:   Your physician recommends that you continue on your current medications as directed. Please refer to the Current Medication list given to you today.  *If you need a refill on your cardiac medications before your next appointment, please call your pharmacy*   Lab Work: VITAMIN D  If you have labs (blood work) drawn today and your tests are completely normal, you will receive your results only by:  [place] (if you have MyChart) OR  A paper copy in the mail If you have any lab test that is abnormal or we need to change your treatment, we will call you to review the results.   Testing/Procedures: Your physician has recommended that you have a sleep study. This test records several body functions during sleep, including: brain activity, eye movement, oxygen and carbon dioxide blood levels, heart rate and rhythm, breathing rate and rhythm, the flow of air through your mouth and nose, snoring, body muscle movements, and chest and belly movement.   Follow-Up: At [facility], you  and your health needs are our priority.  As part of our continuing mission to provide you with exceptional heart care, we have  created designated Provider Care Teams.  These Care Teams include your primary Cardiologist (physician) and Advanced Practice Providers (APPs -  Physician Assistants and Nurse Practitioners) who all work together to provide you with the care you need, when you need it.  We recommend signing up for the patient portal called "MyChart".  Sign up information is provided on this After Visit Summary.  MyChart is used to connect with patients for Virtual Visits (Telemedicine).  Patients are able to view lab/test results, encounter notes, upcoming appointments, etc.  Non-urgent messages can be sent to your provider as well.   To learn more about what you can do with MyChart, go to [email or web address].    Your next appointment:   3 month(s)  The format for your next appointment:   In Person  Provider:   You will see Berniece Salines, DO.  Or, you can be scheduled with the following Advanced Practice Provider on your designated Care Team (at our [facility]):  Laurann Montana, FNP     Other Instructions      Adopting a Healthy Lifestyle.  Know what a healthy weight is for you (roughly BMI <25) and aim to maintain this   Aim for 7+ servings of fruits and vegetables daily   65-80+ fluid ounces of water or unsweet tea for healthy kidneys   Limit to max 1 drink of alcohol per day; avoid smoking/tobacco   Limit animal fats in diet for cholesterol and heart health - choose grass fed whenever available   Avoid highly processed foods, and foods high in saturated/trans fats   Aim for low stress - take time to unwind and care for your mental health   Aim for 150 min of moderate intensity exercise weekly for heart health, and weights twice weekly for bone health   Aim for 7-9 hours of sleep daily   When it comes to diets, agreement about the perfect plan isnt easy  to find, even among the experts. Experts at the [employer] developed an idea known as the Healthy Eating Plate. Just imagine a plate divided into logical, healthy portions.   The emphasis is on diet quality:   Load up on vegetables and fruits - one-half of your plate: Aim for color and variety, and remember that potatoes dont count.   Go for whole grains - one-quarter of your plate: Whole wheat, barley, wheat berries, quinoa, oats, brown rice, and foods made with them. If you want pasta, go with whole wheat pasta.   Protein power - one-quarter of your plate: Fish, chicken, beans, and nuts are all healthy, versatile protein sources. Limit red meat.   The diet, however, does go beyond the plate, offering a few other suggestions.   Use healthy plant oils, such as olive, canola, soy, corn, sunflower and peanut. Check the labels, and avoid partially hydrogenated oil, which have unhealthy trans fats.   If youre thirsty, drink water. Coffee and tea are good in moderation, but skip sugary drinks and limit milk and dairy products to one or two daily servings.   The type of carbohydrate in the diet is more important than the amount. Some sources of carbohydrates, such as vegetables, fruits, whole grains, and beans-are healthier than others.   Finally, stay active  Signed, Berniece Salines, DO  11/22/2019 5:23 PM    [facility]

## 2019-11-22 NOTE — Patient Instructions (Signed)
Medication Instructions:   Your physician recommends that you continue on your current medications as directed. Please refer to the Current Medication list given to you today.  *If you need a refill on your cardiac medications before your next appointment, please call your pharmacy*   Lab Work: VITAMIN D  If you have labs (blood work) drawn today and your tests are completely normal, you will receive your results only by: [place] MyChart Message (if you have MyChart) OR . A paper copy in the mail If you have any lab test that is abnormal or we need to change your treatment, we will call you to review the results.   Testing/Procedures: Your physician has recommended that you have a sleep study. This test records several body functions during sleep, including: brain activity, eye movement, oxygen and carbon dioxide blood levels, heart rate and rhythm, breathing rate and rhythm, the flow of air through your mouth and nose, snoring, body muscle movements, and chest and belly movement.   Follow-Up: At [facility], you and your health needs are our priority.  As part of our continuing mission to provide you with exceptional heart care, we have created designated Provider Care Teams.  These Care Teams include your primary Cardiologist (physician) and Advanced Practice Providers (APPs -  Physician Assistants and Nurse Practitioners) who all work together to provide you with the care you need, when you need it.  We recommend signing up for the patient portal called "MyChart".  Sign up information is provided on this After Visit Summary.  MyChart is used to connect with patients for Virtual Visits (Telemedicine).  Patients are able to view lab/test results, encounter notes, upcoming appointments, etc.  Non-urgent messages can be sent to your provider as well.   To learn more about what you can do with MyChart, go to [email or web address].    Your next appointment:   3 month(s)  The format for your  next appointment:   In Person  Provider:   You will see Berniece Salines, DO.  Or, you can be scheduled with the following Advanced Practice Provider on your designated Care Team (at our [facility]):  Laurann Montana, FNP     Other Instructions

## 2019-11-23 LAB — VITAMIN D 25 HYDROXY (VIT D DEFICIENCY, FRACTURES): Vit D, 25-Hydroxy: 23.7 ng/mL — ABNORMAL LOW (ref 30.0–100.0)

## 2019-11-26 ENCOUNTER — Telehealth: Payer: Self-pay

## 2019-11-26 ENCOUNTER — Encounter: Payer: Self-pay | Admitting: Physical Medicine and Rehabilitation

## 2019-11-26 MED ORDER — VITAMIN D (ERGOCALCIFEROL) 1.25 MG (50000 UNIT) PO CAPS
50000.0000 [IU] | ORAL_CAPSULE | ORAL | 0 refills | Status: DC
Start: 2019-11-26 — End: 2020-01-15

## 2019-11-26 NOTE — Telephone Encounter (Signed)
Left message on patients voicemail to please return our call.   

## 2019-11-26 NOTE — Telephone Encounter (Signed)
Spoke with patient regarding results and recommendation.  Patient verbalizes understanding and is agreeable to plan of care. Advised patient to call back with any issues or concerns.  

## 2019-11-26 NOTE — Telephone Encounter (Signed)
-----   Message from Berniece Salines, DO sent at 11/23/2019  4:26 PM EDT ----- Please let the patient know that his vitamin D level is low. I will like to replace it with 50,000 IU once weekly for eight weeks.

## 2019-12-03 ENCOUNTER — Ambulatory Visit: Payer: Self-pay

## 2019-12-03 ENCOUNTER — Other Ambulatory Visit: Payer: Self-pay

## 2019-12-03 ENCOUNTER — Encounter: Payer: Self-pay | Admitting: Physical Medicine and Rehabilitation

## 2019-12-03 ENCOUNTER — Ambulatory Visit (INDEPENDENT_AMBULATORY_CARE_PROVIDER_SITE_OTHER): Payer: Worker's Compensation | Admitting: Physical Medicine and Rehabilitation

## 2019-12-03 VITALS — BP 131/89 | HR 69

## 2019-12-03 DIAGNOSIS — M5116 Intervertebral disc disorders with radiculopathy, lumbar region: Secondary | ICD-10-CM

## 2019-12-03 DIAGNOSIS — M5416 Radiculopathy, lumbar region: Secondary | ICD-10-CM

## 2019-12-03 MED ORDER — DEXAMETHASONE SODIUM PHOSPHATE 10 MG/ML IJ SOLN
15.0000 mg | Freq: Once | INTRAMUSCULAR | Status: AC
  Administered 2019-12-03: 15 mg

## 2019-12-03 NOTE — Progress Notes (Signed)
Kevin Jackson - 49 y.o. male MRN [id number]  Date of birth: 1970/12/24  Office Visit Note: Visit Date: 12/03/2019 PCP: Lillard Anes, MD Referred by: Lillard Anes,*  Subjective: Chief Complaint  Patient presents with   Lower Back - Pain   HPI:  Kevin Jackson is a 49 y.o. male who comes in today at the request of Dr. Rodell Perna for planned Right L3-L4 Lumbar epidural steroid injection with fluoroscopic guidance.  The patient has failed conservative care including home exercise, medications, time and activity modification.  This injection will be diagnostic and hopefully therapeutic.  Please see requesting physician notes for further details and justification.   ROS Otherwise per HPI.  Assessment & Plan: Visit Diagnoses:  1. Lumbar radiculopathy   2. Radiculopathy due to lumbar intervertebral disc disorder     Plan: No additional findings.   Meds & Orders:  Meds ordered this encounter  Medications   dexamethasone (DECADRON) injection 15 mg    Orders Placed This Encounter  Procedures   XR C-ARM NO REPORT   Epidural Steroid injection    Follow-up: Return in about 2 weeks (around 12/17/2019) for Rodell Perna, MD.   Procedures: No procedures performed  Lumbosacral Transforaminal Epidural Steroid Injection - Sub-Pedicular Approach with Fluoroscopic Guidance  [name]      Date of Birth: 04/25/1971 MRN: [id number] PCP: Lillard Anes, MD      Visit Date: 12/03/2019   Universal Protocol:    Date/Time: 12/03/2019  Consent Given By: the patient  Position: PRONE  Additional Comments: Vital signs were monitored before and after the procedure. Patient was prepped and draped in the usual sterile fashion. The correct patient, procedure, and site was verified.   Injection Procedure Details:  Procedure Site One Meds Administered:  Meds ordered this encounter  Medications   dexamethasone (DECADRON)  injection 15 mg    Laterality: Right  Location/Site:  L3-L4  Needle size: 50 G  Needle type: Spinal  Needle Placement: Transforaminal  Findings:    -Comments: Excellent flow of contrast along the nerve, nerve root and into the epidural space.  Procedure Details: After squaring off the end-plates to get a true AP view, the C-arm was positioned so that an oblique view of the foramen as noted above was visualized. The target area is just inferior to the "nose of the scotty dog" or sub pedicular. The soft tissues overlying this structure were infiltrated with 2-3 ml. of 1% Lidocaine without Epinephrine.  The spinal needle was inserted toward the target using a "trajectory" view along the fluoroscope beam.  Under AP and lateral visualization, the needle was advanced so it did not puncture dura and was located close the 6 O'Clock position of the pedical in AP tracterory. Biplanar projections were used to confirm position. Aspiration was confirmed to be negative for CSF and/or blood. A 1-2 ml. volume of Isovue-250 was injected and flow of contrast was noted at each level. Radiographs were obtained for documentation purposes.   After attaining the desired flow of contrast documented above, a 0.5 to 1.0 ml test dose of 0.25% Marcaine was injected into each respective transforaminal space.  The patient was observed for 90 seconds post injection.  After no sensory deficits were reported, and normal lower extremity motor function was noted,   the above injectate was administered so that equal amounts of the injectate were placed at each foramen (level) into the transforaminal epidural space.   Additional Comments:  The patient tolerated the  procedure well Dressing: 2 x 2 sterile gauze and Band-Aid    Post-procedure details: Patient was observed during the procedure. Post-procedure instructions were reviewed.  Patient left the clinic in stable condition.      Clinical History: Acute  Interface, Incoming Rad Results - 09/18/2019 11:44 AM EDT INDICATION: Acute right-sided low back pain, unspecified whether sciatica present [M54.5 (ICD-10-CM)]  COMPARISON: None  TECHNIQUE: Multiplanar, multi-sequence surface-coil MR imaging of the lumbar spine was performed without contrast.  FINDINGS: The most caudal well-formed disc is considered L5-S1.   Marland Kitchen  Alignment: Normal.  .  Vertebrae: No suspicious marrow signal abnormalities to suggest malignancy. No compression fractures or marrow edema. No evidence of spondylolysis.  . Degenerative endplate signal changes: None  .  Spinal canal: No abnormal collection or neoplastic lesion is evident by non-contrast technique. Patient has somewhat narrow spinal canal developmentally for example at L2-L3 where there is no mass effect on the thecal sac the spinal canal measures 10 mm.  .  Conus: Normal signal and position. No compressive lesion.  .  Degenerative disease:  By level  .  T11-T12: No significant focal abnormality.  .  T12-L1: No significant focal abnormality.  .  L1-L2: No significant focal abnormality.  .  L2-L3: No significant focal abnormality.  Marland Kitchen  L3-L4: Tiny right foraminal disc protrusion and small endplate osteophyte, with asymmetric associated visualization of normal perineural fat along the inferior aspect of the traversing right L3 nerve. Preserved disc height. Mild facet overgrowth bilaterally. Moderate right and mild left foraminal stenosis. Mild central stenosis, thecal sac about 8-9 mm.  Marland Kitchen  L4-L5: Minimal broad disc bulge otherwise normal disc without herniation. No significant stenosis. Minimal bilateral facet joint hypertrophy.  Marland Kitchen  L5-S1: No significant focal abnormality.  .  Included sacrum/Ilium: No fracture or mass is evident.  .  Soft tissues: No mass or fluid collection. Psoas muscles are symmetric without significant atrophy.  .  Incidental findings: Scattered bilateral T2 renal hyperintensities  not optimally characterized by this examination, some incompletely imaged.   IMPRESSION: Mild degenerative change at L3-4 with right greater than left foraminal stenosis, probable small right foraminal disc protrusion contributing to crowding of right L3 nerve. Please correlate for potential right L3 radiculopathy. Slightly more focal spinal canal narrowing at this level also noted with background somewhat congenitally narrow spinal canal. No high-grade central stenosis or compression of the cauda equina. No other disc herniations or other area of potential neural impingement.   Scattered T2 hyperintensities in the kidneys probably benign incidental cysts. Consider renal ultrasound for characterization.  Electronically Signed by: Anselm Pancoast     Objective:  VS:  HT:     WT:    BMI:      BP:131/89   HR:69bpm   TEMP: ( )   RESP:  Physical Exam Constitutional:      General: He is not in acute distress.    Appearance: Normal appearance. He is not ill-appearing.  HENT:     Head: Normocephalic and atraumatic.     Right Ear: External ear normal.     Left Ear: External ear normal.  Eyes:     Extraocular Movements: Extraocular movements intact.  Cardiovascular:     Rate and Rhythm: Normal rate.     Pulses: Normal pulses.  Abdominal:     General: There is no distension.     Palpations: Abdomen is soft.  Musculoskeletal:        General: No tenderness or signs of injury.  Right lower leg: No edema.     Left lower leg: No edema.     Comments: Patient has good distal strength without clonus.  Skin:    Findings: No erythema or rash.  Neurological:     General: No focal deficit present.     Mental Status: He is alert and oriented to person, place, and time.     Sensory: No sensory deficit.     Motor: No weakness or abnormal muscle tone.     Coordination: Coordination normal.  Psychiatric:        Mood and Affect: Mood normal.        Behavior: Behavior normal.      Imaging: No  results found.

## 2019-12-03 NOTE — Procedures (Signed)
Lumbosacral Transforaminal Epidural Steroid Injection - Sub-Pedicular Approach with Fluoroscopic Guidance  [name]      Date of Birth: [birth year] MRN: [id number] PCP: Lillard Anes, MD      Visit Date: 12/03/2019   Universal Protocol:    Date/Time: 12/03/2019  Consent Given By: the patient  Position: PRONE  Additional Comments: Vital signs were monitored before and after the procedure. Patient was prepped and draped in the usual sterile fashion. The correct patient, procedure, and site was verified.   Injection Procedure Details:  Procedure Site One Meds Administered:  Meds ordered this encounter  Medications  . dexamethasone (DECADRON) injection 15 mg    Laterality: Right  Location/Site:  L3-L4  Needle size: 45 G  Needle type: Spinal  Needle Placement: Transforaminal  Findings:    -Comments: Excellent flow of contrast along the nerve, nerve root and into the epidural space.  Procedure Details: After squaring off the end-plates to get a true AP view, the C-arm was positioned so that an oblique view of the foramen as noted above was visualized. The target area is just inferior to the "nose of the scotty dog" or sub pedicular. The soft tissues overlying this structure were infiltrated with 2-3 ml. of 1% Lidocaine without Epinephrine.  The spinal needle was inserted toward the target using a "trajectory" view along the fluoroscope beam.  Under AP and lateral visualization, the needle was advanced so it did not puncture dura and was located close the 6 O'Clock position of the pedical in AP tracterory. Biplanar projections were used to confirm position. Aspiration was confirmed to be negative for CSF and/or blood. A 1-2 ml. volume of Isovue-250 was injected and flow of contrast was noted at each level. Radiographs were obtained for documentation purposes.   After attaining the desired flow of contrast documented above, a 0.5 to 1.0 ml test  dose of 0.25% Marcaine was injected into each respective transforaminal space.  The patient was observed for 90 seconds post injection.  After no sensory deficits were reported, and normal lower extremity motor function was noted,   the above injectate was administered so that equal amounts of the injectate were placed at each foramen (level) into the transforaminal epidural space.   Additional Comments:  The patient tolerated the procedure well Dressing: 2 x 2 sterile gauze and Band-Aid    Post-procedure details: Patient was observed during the procedure. Post-procedure instructions were reviewed.  Patient left the clinic in stable condition.

## 2019-12-03 NOTE — Progress Notes (Signed)
  Pt states he has pain on his right lower back. Pt state leaning to the right makes the pain worse were he feels it down his right leg. Pt states bending makes the pain worse. Pt states leaning to the left make the pain feel better. Pt had an xray on 11/11/19.   Numeric Pain Rating Scale and Functional Assessment Average Pain 5   In the last MONTH (on 0-10 scale) has pain interfered with the following?  1. General activity like being  able to carry out your everyday physical activities such as walking, climbing stairs, carrying groceries, or moving a chair?  Rating(7)   +Driver, +557/DU, -Dye Allergies.

## 2019-12-04 ENCOUNTER — Encounter: Payer: Self-pay | Admitting: Specialist

## 2019-12-04 ENCOUNTER — Ambulatory Visit: Payer: 59 | Admitting: Specialist

## 2019-12-04 VITALS — BP 132/84 | HR 94 | Ht 70.0 in | Wt 195.0 lb

## 2019-12-04 DIAGNOSIS — R0602 Shortness of breath: Secondary | ICD-10-CM | POA: Diagnosis not present

## 2019-12-04 DIAGNOSIS — R0789 Other chest pain: Secondary | ICD-10-CM | POA: Diagnosis not present

## 2019-12-04 DIAGNOSIS — S2220XA Unspecified fracture of sternum, initial encounter for closed fracture: Secondary | ICD-10-CM

## 2019-12-04 DIAGNOSIS — G8929 Other chronic pain: Secondary | ICD-10-CM

## 2019-12-04 DIAGNOSIS — R0781 Pleurodynia: Secondary | ICD-10-CM

## 2019-12-04 DIAGNOSIS — M546 Pain in thoracic spine: Secondary | ICD-10-CM

## 2019-12-04 DIAGNOSIS — M5134 Other intervertebral disc degeneration, thoracic region: Secondary | ICD-10-CM

## 2019-12-04 DIAGNOSIS — M792 Neuralgia and neuritis, unspecified: Secondary | ICD-10-CM

## 2019-12-04 MED ORDER — GABAPENTIN 300 MG PO CAPS
ORAL_CAPSULE | ORAL | 0 refills | Status: DC
Start: 2019-12-04 — End: 2020-01-06

## 2019-12-04 NOTE — Addendum Note (Signed)
Addended by: Basil Dess on: 12/04/2019 01:45 PM   Modules accepted: Orders

## 2019-12-04 NOTE — Progress Notes (Signed)
Office Visit Note   [name]           Date of Birth: [birth year]           MRN: [id number] Visit Date: 12/04/2019              Requested by: Lillard Anes, MD [address] [place],  [place] [address] PCP: Lillard Anes, MD   Assessment & Plan: Visit Diagnoses:  1. Chronic thoracic back pain, unspecified back pain laterality   2. Shortness of breath   3. Costochondral chest pain   4. Closed fracture of sternum, unspecified portion of sternum, initial encounter   5. Rib pain on right side   6. Thoracic neuralgia   7. Thoracic degenerative disc disease     Plan: Rib belt for comfort. Ice or heat, hot shower for pain.  Hemp CBD capsules, amazon.com 5,000-7,000 mg per bottle, 60 capsules per bottle, take one capsule twice a day. Gabapentin 300 mg po qhs ramp to TID  Referral to cardiothoracic surgeon for post thoracotomy pain spurs along rib margin causing mechanical and neurogenic pain due to local Thoracic nerve irritation. Motrin  For inflamation due to bone pressure against the right ribs and movement of the xyphoid process in a possible non healed xyphoid fracture area.  Bone scan is ordered to assess for infection or inflamation or degenerative joint or disc disease.  Follow-Up Instructions: No follow-ups on file.   Follow-Up Instructions: Return in about 3 weeks (around 12/25/2019).   Orders:  Orders Placed This Encounter  Procedures  . NM Bone Scan Whole Body  . Ambulatory referral to Cardiothoracic Surgery   No orders of the defined types were placed in this encounter.     Procedures: No procedures performed   Clinical Data: No additional findings.   Subjective: Chief Complaint  Patient presents with  . Middle Back - Pain    49 year old male with history of thoracic disc herniation T7 treated with disc excision via transpedicular approach. He is having pain in the right anterior chest wall associated  with turning and twisting and rotation of his thorax. He feel the right mid lower third rib move and become more prominent with rotation and twisting the spine. If  hee is bearing down while crunching of flexing the thorax like getting into the tub. There is pain with the subluxation and a notice able deformity over the right anterior costochondral junction. He will punch the anterior rib to push it backwards into place. There is numbness right anterior costal border over the right upper abdomen. Able to walk though with a limp to the right. Leans to the left. Stands all day at work. He is working with [employer] and he is at a point where he wish is have the discomfort resolved. Sneeze or cough is painful and lifting his 49 year old and as an infant would cause pain. 65, 31, 21 and 98 year old.    Review of Systems  Constitutional: Negative.  Negative for activity change, appetite change, chills, diaphoresis, fatigue, fever and unexpected weight change.  HENT: Negative.  Negative for congestion, dental problem, drooling, ear discharge, ear pain, facial swelling, hearing loss, mouth sores, nosebleeds, postnasal drip, rhinorrhea, sinus pressure, sinus pain, sneezing, sore throat, tinnitus, trouble swallowing and voice change.   Eyes: Negative.  Negative for photophobia, pain, discharge, redness, itching and visual disturbance.  Respiratory: Positive for cough. Negative for apnea, choking, chest tightness, shortness  of breath, wheezing and stridor.   Cardiovascular: Positive for chest pain. Negative for palpitations and leg swelling.  Gastrointestinal: Positive for abdominal pain. Negative for abdominal distention, anal bleeding, blood in stool, constipation, diarrhea, nausea and rectal pain.  Endocrine: Negative.  Negative for cold intolerance, heat intolerance, polydipsia, polyphagia and polyuria.  Genitourinary: Negative.  Negative for difficulty urinating, dysuria, enuresis, flank pain, frequency and  hematuria.  Musculoskeletal: Positive for back pain. Negative for arthralgias, gait problem, joint swelling, myalgias, neck pain and neck stiffness.  Skin: Negative.  Negative for color change, pallor, rash and wound.  Allergic/Immunologic: Negative for environmental allergies, food allergies and immunocompromised state.  Neurological: Positive for weakness and numbness. Negative for dizziness, tremors, seizures, syncope, facial asymmetry, speech difficulty, light-headedness and headaches.  Hematological: Negative for adenopathy. Does not bruise/bleed easily.  Psychiatric/Behavioral: Negative.  Negative for agitation, behavioral problems, confusion, decreased concentration, dysphoric mood, hallucinations, self-injury, sleep disturbance and suicidal ideas. The patient is not nervous/anxious and is not hyperactive.      Objective: Vital Signs: BP 132/84 (BP Location: Left Arm, Patient Position: Sitting)   Pulse 94   Ht 5\' 10"  (1.778 m)   Wt 195 lb (88.5 kg)   BMI 27.98 kg/m   Physical Exam Constitutional:      Appearance: He is well-developed.  HENT:     Head: Normocephalic and atraumatic.  Eyes:     Pupils: Pupils are equal, round, and reactive to light.  Pulmonary:     Effort: Pulmonary effort is normal.     Breath sounds: Normal breath sounds.  Abdominal:     General: Bowel sounds are normal.     Palpations: Abdomen is soft.  Musculoskeletal:     Cervical back: Normal range of motion and neck supple.     Lumbar back: Negative right straight leg raise test and negative left straight leg raise test.  Skin:    General: Skin is warm and dry.  Neurological:     Mental Status: He is alert and oriented to person, place, and time.  Psychiatric:        Behavior: Behavior normal.        Thought Content: Thought content normal.        Judgment: Judgment normal.     Back Exam   Tenderness  The patient is experiencing tenderness in the thoracic and lumbar.  Range of Motion    Extension: abnormal  Flexion: abnormal  Lateral bend right: normal  Lateral bend left: normal  Rotation right: normal  Rotation left: normal   Muscle Strength  Right Quadriceps:  5/5  Left Quadriceps:  5/5  Right Hamstrings:  5/5  Left Hamstrings:  5/5   Tests  Straight leg raise right: negative Straight leg raise left: negative  Reflexes  Patellar: 3/4 Achilles: 0/4 Babinski's sign: normal   Other  Toe walk: normal Heel walk: normal Sensation: decreased Gait: abnormal  Erythema: no back redness Scars: absent  Comments:  Pain with right chest wall compression and with palpation of the xyphoid process with a palpabable catching of the xyphosternal junction. No motor deficit.. Sensory deficit in the area of the right thoracotomy and right anterior chest wall T7-8.       Specialty Comments:  No specialty comments available.  Imaging: Epidural Steroid injection  Result Date: 12/03/2019 Magnus Sinning, MD     12/03/2019  1:50 PM Lumbosacral Transforaminal Epidural Steroid Injection - Sub-Pedicular Approach with Fluoroscopic Guidance [name]     Date of Birth:  01-24-1971 MRN: [id number] PCP: Lillard Anes, MD     Visit Date: 12/03/2019  Universal Protocol:   Date/Time: 12/03/2019 Consent Given By: the patient Position: PRONE Additional Comments: Vital signs were monitored before and after the procedure. Patient was prepped and draped in the usual sterile fashion. The correct patient, procedure, and site was verified. Injection Procedure Details: Procedure Site One Meds Administered: Meds ordered this encounter Medications . dexamethasone (DECADRON) injection 15 mg Laterality: Right Location/Site: L3-L4 Needle size: 61 G Needle type: Spinal Needle Placement: Transforaminal Findings:   -Comments: Excellent flow of contrast along the nerve, nerve root and into the epidural space. Procedure Details: After squaring off the end-plates to get a true AP  view, the C-arm was positioned so that an oblique view of the foramen as noted above was visualized. The target area is just inferior to the "nose of the scotty dog" or sub pedicular. The soft tissues overlying this structure were infiltrated with 2-3 ml. of 1% Lidocaine without Epinephrine. The spinal needle was inserted toward the target using a "trajectory" view along the fluoroscope beam.  Under AP and lateral visualization, the needle was advanced so it did not puncture dura and was located close the 6 O'Clock position of the pedical in AP tracterory. Biplanar projections were used to confirm position. Aspiration was confirmed to be negative for CSF and/or blood. A 1-2 ml. volume of Isovue-250 was injected and flow of contrast was noted at each level. Radiographs were obtained for documentation purposes. After attaining the desired flow of contrast documented above, a 0.5 to 1.0 ml test dose of 0.25% Marcaine was injected into each respective transforaminal space.  The patient was observed for 90 seconds post injection.  After no sensory deficits were reported, and normal lower extremity motor function was noted,   the above injectate was administered so that equal amounts of the injectate were placed at each foramen (level) into the transforaminal epidural space. Additional Comments: The patient tolerated the procedure well Dressing: 2 x 2 sterile gauze and Band-Aid  Post-procedure details: Patient was observed during the procedure. Post-procedure instructions were reviewed. Patient left the clinic in stable condition.   XR C-ARM NO REPORT  Result Date: 12/03/2019 Please see Notes tab for imaging impression.    PMFS History: Patient Active Problem List   Diagnosis Date Noted  . 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  . Mixed hyperlipidemia 09/17/2019  . Supraventricular tachycardia ([place]) 09/17/2019  . Chronic chest wall pain 08/28/2015  . 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  Numerous moles 01/19/2012  . Hypertension 03/17/2011  . CHEST PAIN 05/06/2010  . BACK PAIN, LUMBAR 08/06/2009   Past Medical History:  Diagnosis Date  . BACK PAIN, LUMBAR 08/06/2009   Qualifier: Diagnosis of  By: Regino Schultze CMA (AAMA), Apolonio Schneiders    . CHEST PAIN 05/06/2010   Qualifier: Diagnosis of  By: Sherren Mocha MD, Jory Ee   . Chronic chest wall pain 08/28/2015  . GERD (gastroesophageal reflux disease)   . Hypertension   . Kidney stones   . Mixed hyperlipidemia 09/17/2019  . Neuralgia   . Numerous moles 01/19/2012  . 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 Supraventricular tachycardia ([place]) 09/17/2019    Family History  Problem Relation Age of Onset  . Hypertension Mother   . Asthma Mother   . Cancer Paternal Uncle        intestinal  . Healthy Sister   . Healthy Brother   . Scoliosis Daughter   . Healthy Son   .  Other Brother        tumors    Past Surgical History:  Procedure Laterality Date  . APPENDECTOMY    . BACK SURGERY    . HERNIA REPAIR    . LAPAROSCOPIC GASTROTOMY W/ REPAIR OF ULCER    . right thoracotomy for exposure for the C7-T8 diskectomy  02/06/2007   Dr Arlyce Dice  . TONSILLECTOMY     Social History   Occupational History    Employer: [employer]  Tobacco Use  . Smoking status: Former Smoker    Types: Cigarettes    Quit date: 05/23/1997    Years since quitting: 22.5  . Smokeless tobacco: Current User    Types: Chew  Vaping Use  . Vaping Use: Never used  Substance and Sexual Activity  . Alcohol use: Yes    Alcohol/week: 6.0 standard drinks    Types: 6 Cans of beer per week    Comment: Once a month  . Drug use: Not Currently  . Sexual activity: Yes

## 2019-12-04 NOTE — Patient Instructions (Signed)
Rib belt for comfort. Ice or heat, hot shower for pain.  Hemp CBD capsules, amazon.com 5,000-7,000 mg per bottle, 60 capsules per bottle, take one capsule twice a day. Gabapentin 300 mg po qhs ramp to TID  Referral to cardiothoracic surgeon for post thoracotomy pain spurs along rib margin causing mechanical and neurogenic pain due to local Thoracic nerve irritation. Motrin  For inflamation due to bone pressure against the right ribs and movement of the xyphoid process in a possible non healed xyphoid fracture area.  Bone scan is ordered to assess for infection or inflamation or degenerative joint or disc disease.  Follow-Up Instructions: No follow-ups on file.

## 2019-12-16 ENCOUNTER — Telehealth: Payer: Self-pay | Admitting: Specialist

## 2019-12-16 NOTE — Telephone Encounter (Signed)
Received vn from [facility], wanting to check status of request for records. IC [phone] to lve msg, mailbox full. Last request received dated 7/1.

## 2019-12-17 ENCOUNTER — Encounter: Payer: Self-pay | Admitting: Orthopaedic Surgery

## 2019-12-17 ENCOUNTER — Ambulatory Visit (INDEPENDENT_AMBULATORY_CARE_PROVIDER_SITE_OTHER): Payer: Worker's Compensation | Admitting: Orthopaedic Surgery

## 2019-12-17 VITALS — Ht 70.0 in | Wt 195.0 lb

## 2019-12-17 DIAGNOSIS — M5126 Other intervertebral disc displacement, lumbar region: Secondary | ICD-10-CM

## 2019-12-17 NOTE — Progress Notes (Signed)
Office Visit Note   [name]           Date of Birth: 1970/07/14           MRN: [id number] Visit Date: 12/17/2019              Requested by: Lillard Anes, MD [address] [place],  [place] [address] PCP: Lillard Anes, MD   Assessment & Plan: Visit Diagnoses:  1. Protrusion of lumbar intervertebral disc     Plan: Patient has some lumbar disc degeneration tiny protrusion. Reviewed the scan results again. This point in time no indication for operative intervention in the lumbar spine. Unfortunately did not get great relief with the epidural. We again discussed lumbar disc degeneration pathophysiology of the condition. Questions were elicited and answered. He can follow-up with me on a as needed basis.  Follow-Up Instructions: Return if symptoms worsen or fail to improve.   Orders:  No orders of the defined types were placed in this encounter.  No orders of the defined types were placed in this encounter.     Procedures: No procedures performed   Clinical Data: No additional findings.   Subjective: Chief Complaint  Patient presents with  . Lower Back - Pain, Follow-up    HPI 49 year old male returns post lumbar epidural on 12/03/2019. Patient is currently seeing Dr. Louanne Skye for his thoracic spine. He states the epidural helped for 3 days then he has had recurrence of symptoms as before. Previous MRI showed some small disc protrusion and extraforaminal disc with some foraminal narrowing slightly worse on the right than left. He did report on-the-job injury 07/26/2019. MRI scan 11/05/2019 showed tiny foraminal protrusion at L3-4. Moderate right and mild left foraminal stenosis. L4-5 showed minimal disc bulge otherwise normal without herniation no significant stenosis. L1-L2 3 levels were normal.  Review of Systems 14 point systems updated unchanged from 09/23/2019 other than as mentioned HPI.   Objective: Vital Signs: Ht 5\' 10"   (1.778 m)   Wt 195 lb (88.5 kg)   BMI 27.98 kg/m   Physical Exam Constitutional:      Appearance: He is well-developed.  HENT:     Head: Normocephalic and atraumatic.  Eyes:     Pupils: Pupils are equal, round, and reactive to light.  Neck:     Thyroid: No thyromegaly.     Trachea: No tracheal deviation.  Cardiovascular:     Rate and Rhythm: Normal rate.  Pulmonary:     Effort: Pulmonary effort is normal.     Breath sounds: No wheezing.  Abdominal:     General: Bowel sounds are normal.     Palpations: Abdomen is soft.  Skin:    General: Skin is warm and dry.     Capillary Refill: Capillary refill takes less than 2 seconds.  Neurological:     Mental Status: He is alert and oriented to person, place, and time.  Psychiatric:        Behavior: Behavior normal.        Thought Content: Thought content normal.        Judgment: Judgment normal.     Ortho Exam patient has some pain with palpation lumbosacral junction. Normal heel toe gait. Ankle dorsiflexion plantarflexion is strong.  Specialty Comments:  No specialty comments available.  Imaging: No results found.   PMFS History: Patient Active Problem List   Diagnosis Date Noted  . 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  . Mixed hyperlipidemia  09/17/2019  . Supraventricular tachycardia ([place]) 09/17/2019  . Chronic chest wall pain 08/28/2015  . 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 Numerous moles 01/19/2012  . Hypertension 03/17/2011  . CHEST PAIN 05/06/2010  . BACK PAIN, LUMBAR 08/06/2009   Past Medical History:  Diagnosis Date  . BACK PAIN, LUMBAR 08/06/2009   Qualifier: Diagnosis of  By: Regino Schultze CMA (AAMA), Apolonio Schneiders    . CHEST PAIN 05/06/2010   Qualifier: Diagnosis of  By: Sherren Mocha MD, Jory Ee   . Chronic chest wall pain 08/28/2015  . GERD (gastroesophageal reflux disease)   . Hypertension   . Kidney stones   . Mixed hyperlipidemia 09/17/2019  . Neuralgia   . Numerous moles 01/19/2012  . Shortened PR  interval 08/01/2015  . Supraventricular tachycardia ([place]) 09/17/2019    Family History  Problem Relation Age of Onset  . Hypertension Mother   . Asthma Mother   . Cancer Paternal Uncle        intestinal  . Healthy Sister   . Healthy Brother   . Scoliosis Daughter   . Healthy Son   . Other Brother        tumors    Past Surgical History:  Procedure Laterality Date  . APPENDECTOMY    . BACK SURGERY    . HERNIA REPAIR    . LAPAROSCOPIC GASTROTOMY W/ REPAIR OF ULCER    . right thoracotomy for exposure for the C7-T8 diskectomy  02/06/2007   Dr Arlyce Dice  . TONSILLECTOMY     Social History   Occupational History    Employer: [employer]  Tobacco Use  . Smoking status: Former Smoker    Types: Cigarettes    Quit date: 05/23/1997    Years since quitting: 22.5  . Smokeless tobacco: Current User    Types: Chew  Vaping Use  . Vaping Use: Never used  Substance and Sexual Activity  . Alcohol use: Yes    Alcohol/week: 6.0 standard drinks    Types: 6 Cans of beer per week    Comment: Once a month  . Drug use: Not Currently  . Sexual activity: Yes

## 2019-12-19 ENCOUNTER — Encounter (HOSPITAL_COMMUNITY)
Admission: RE | Admit: 2019-12-19 | Discharge: 2019-12-19 | Disposition: A | Discharge status: Home / Self Care | Payer: 59 | Source: Ambulatory Visit | Attending: Specialist | Admitting: Specialist

## 2019-12-19 ENCOUNTER — Ambulatory Visit: Payer: 59 | Admitting: Cardiology

## 2019-12-19 DIAGNOSIS — R0602 Shortness of breath: Secondary | ICD-10-CM | POA: Diagnosis not present

## 2019-12-19 MED ORDER — TECHNETIUM TC 99M MEDRONATE IV KIT
20.0000 | PACK | Freq: Once | INTRAVENOUS | Status: AC | PRN
  Administered 2019-12-19: 20 via INTRAVENOUS

## 2019-12-23 ENCOUNTER — Encounter: Payer: Self-pay | Admitting: Specialist

## 2019-12-25 ENCOUNTER — Other Ambulatory Visit: Payer: Self-pay | Admitting: Radiology

## 2019-12-25 ENCOUNTER — Telehealth: Payer: Self-pay | Admitting: Specialist

## 2019-12-25 DIAGNOSIS — R0782 Intercostal pain: Secondary | ICD-10-CM

## 2019-12-26 NOTE — Telephone Encounter (Signed)
Pending P2P review with Dr. Louanne Skye

## 2019-12-27 ENCOUNTER — Encounter: Payer: Self-pay | Admitting: Specialist

## 2019-12-27 ENCOUNTER — Telehealth: Payer: Self-pay | Admitting: Specialist

## 2019-12-27 NOTE — Telephone Encounter (Signed)
I have printed the information and gave it to Dr. Louanne Skye

## 2019-12-27 NOTE — Telephone Encounter (Signed)
Cindy with [facility] called asking that the CT be changed to having contrast.  Cindy CB# [phone]

## 2019-12-27 NOTE — Telephone Encounter (Signed)
Order as been changed to with contrast, but it still went in p2p, I sent referral message to you

## 2019-12-30 ENCOUNTER — Other Ambulatory Visit: Payer: Self-pay

## 2019-12-30 NOTE — Progress Notes (Signed)
Note entered for patient.

## 2020-01-01 ENCOUNTER — Telehealth: Payer: Self-pay

## 2020-01-01 NOTE — Telephone Encounter (Signed)
This was changed however the insurance denied the request

## 2020-01-01 NOTE — Telephone Encounter (Signed)
Cindy at [facility] called in wanted to get changes made to the recent ref original request was CT chest WITHOUT contrast , needs to be changed to CT chest WITH contrast and sch for august 17th    [phone]

## 2020-01-02 NOTE — Telephone Encounter (Signed)
I called and advised Jenny Reichmann that this has been changed however the insurance has denied it and it needs a peer-to-peer

## 2020-01-03 ENCOUNTER — Other Ambulatory Visit: Payer: Self-pay

## 2020-01-03 ENCOUNTER — Ambulatory Visit: Payer: 59 | Admitting: Specialist

## 2020-01-03 ENCOUNTER — Encounter: Payer: Self-pay | Admitting: Specialist

## 2020-01-03 VITALS — BP 133/94 | HR 78 | Ht 70.0 in | Wt 195.0 lb

## 2020-01-03 DIAGNOSIS — R0789 Other chest pain: Secondary | ICD-10-CM | POA: Diagnosis not present

## 2020-01-03 DIAGNOSIS — R0782 Intercostal pain: Secondary | ICD-10-CM

## 2020-01-03 MED ORDER — DICLOFENAC SODIUM 1 % EX GEL: 2.0000 g | Freq: Four times a day (QID) | CUTANEOUS | 2 refills | Status: DC

## 2020-01-03 NOTE — Progress Notes (Addendum)
Office Visit Note   [name]           Date of Birth: Jul 29, 1970           MRN: [id number] Visit Date: 01/03/2020              Requested by: Kevin Anes, MD [address],  [place] [address] PCP: Kevin Anes, MD   Assessment & Plan: Visit Diagnoses:  1. Intercostal pain   2. Xyphoidalgia     Plan: CT of the thoracic spine and chest with contrast Appt with Dr. Lawson Jackson to assess for xyphoidnyia assess for considering a resection.   Follow-Up Instructions: Return in about 4 weeks (around 01/31/2020).   Orders:  No orders of the defined types were placed in this encounter.  No orders of the defined types were placed in this encounter.     Procedures: No procedures performed   Clinical Data: No additional findings.   Subjective: Chief Complaint  Patient presents with  . Middle Back - Follow-up    Bone Scan Review, he needs to have a CT scan of the chest per CVTS with contrast that peer to peer to get this approved    49 year old male with history of right anterior chest pain in the area of the right sternal body xyphoid process articulation. This is worsened by straining and use of the arms and direct pressure over the area. The condition began following Right T7-8 thoracotomy with a sensation of swelling or enlargement of the area over the xyphoid process. He is persisting with  Pain. Kevin Jackson performed the thoracic surgery with Kevin Jackson's assistance. I have recommended that he be Seen by Kevin Jackson for assessment of the area of the thoracotomy and right xyphoid-sternal junction. His previous 06/17/2016 CT scan shows an anteriorly angulated xyphoid process with a lucency over the distal part of the process. Literature for xyphoidynia Suggests that this type of deformity is on occasion associated with xyphoidynia.    Review of Systems  Constitutional: Negative.  Negative for  activity change, appetite change, chills, diaphoresis, fatigue, fever and unexpected weight change.  HENT: Negative.  Negative for congestion, dental problem, drooling, ear discharge, ear pain, facial swelling, hearing loss, mouth sores, nosebleeds, postnasal drip and rhinorrhea.   Eyes: Negative.  Negative for photophobia, pain, discharge, redness, itching and visual disturbance.  Respiratory: Negative.  Negative for apnea, cough, choking, chest tightness, shortness of breath, wheezing and stridor.   Cardiovascular: Positive for chest pain. Negative for palpitations and leg swelling.  Gastrointestinal: Negative.  Negative for abdominal distention, abdominal pain, anal bleeding, blood in stool, constipation, diarrhea, nausea, rectal pain and vomiting.  Endocrine: Negative.  Negative for cold intolerance, heat intolerance, polydipsia, polyphagia and polyuria.  Genitourinary: Positive for flank pain. Negative for difficulty urinating, dysuria, enuresis, frequency, genital sores, hematuria and urgency.  Musculoskeletal: Positive for back pain. Negative for arthralgias, gait problem, joint swelling, myalgias, neck pain and neck stiffness.  Skin: Negative.  Negative for color change, pallor, rash and wound.  Allergic/Immunologic: Negative.  Negative for environmental allergies, food allergies and immunocompromised state.  Neurological: Negative.  Negative for dizziness, tremors, seizures, syncope, facial asymmetry, speech difficulty, weakness, light-headedness, numbness and headaches.  Hematological: Negative.  Negative for adenopathy. Does not bruise/bleed easily.  Psychiatric/Behavioral: Negative.  Negative for agitation, behavioral problems, confusion, decreased concentration, dysphoric mood, hallucinations, self-injury, sleep disturbance and suicidal ideas. The patient is not nervous/anxious and  is not hyperactive.      Objective: Vital Signs: BP (!) 133/94 (BP Location: Left Arm, Patient Position:  Sitting)   Pulse 78   Ht 5\' 10"  (1.778 m)   Wt 195 lb (88.5 kg)   BMI 27.98 kg/m   Physical Exam Constitutional:      Appearance: He is well-developed.  HENT:     Head: Normocephalic and atraumatic.  Eyes:     Pupils: Pupils are equal, round, and reactive to light.  Pulmonary:     Effort: Pulmonary effort is normal.     Breath sounds: Normal breath sounds.  Abdominal:     General: Bowel sounds are normal.     Palpations: Abdomen is soft.  Musculoskeletal:        General: Normal range of motion.     Cervical back: Normal range of motion and neck supple.  Skin:    General: Skin is warm and dry.  Neurological:     Mental Status: He is alert and oriented to person, place, and time.  Psychiatric:        Behavior: Behavior normal.        Thought Content: Thought content normal.        Judgment: Judgment normal.     Back Exam   Tenderness  The patient is experiencing tenderness in the thoracic.  Comments:  Thoracic spine in the midline with mild discomfort T5-T8 Right lower ribs with tenderness to palpation. Most significant  Discomfort ellicited with palpation along the right xyphoid process and at the xyphosternal area.       Specialty Comments:  No specialty comments available.  Imaging: No results found.   PMFS History: Patient Active Problem List   Diagnosis Date Noted  . 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  . Mixed hyperlipidemia 09/17/2019  . Supraventricular tachycardia ([place]) 09/17/2019  . Chronic chest wall pain 08/28/2015  . 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 Numerous moles 01/19/2012  . Hypertension 03/17/2011  . CHEST PAIN 05/06/2010  . BACK PAIN, LUMBAR 08/06/2009   Past Medical History:  Diagnosis Date  . BACK PAIN, LUMBAR 08/06/2009   Qualifier: Diagnosis of  By: Regino Schultze CMA (AAMA), Apolonio Schneiders    . CHEST PAIN 05/06/2010   Qualifier: Diagnosis of  By: Sherren Mocha MD, Jory Ee   . Chronic chest wall pain 08/28/2015  . GERD  (gastroesophageal reflux disease)   . Hypertension   . Kidney stones   . Mixed hyperlipidemia 09/17/2019  . Neuralgia   . Numerous moles 01/19/2012  . 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 Supraventricular tachycardia ([place]) 09/17/2019    Family History  Problem Relation Age of Onset  . Hypertension Mother   . Asthma Mother   . Cancer Paternal Uncle        intestinal  . Healthy Sister   . Healthy Brother   . Scoliosis Daughter   . Healthy Son   . Other Brother        tumors    Past Surgical History:  Procedure Laterality Date  . APPENDECTOMY    . BACK SURGERY    . HERNIA REPAIR    . LAPAROSCOPIC GASTROTOMY W/ REPAIR OF ULCER    . right thoracotomy for exposure for the C7-T8 diskectomy  02/06/2007   Dr Arlyce Dice  . TONSILLECTOMY     Social History   Occupational History    Employer: [employer]  Tobacco Use  . Smoking status: Former Smoker    Types: Cigarettes    Quit  date: 05/23/1997    Years since quitting: 22.6  . Smokeless tobacco: Current User    Types: Chew  Vaping Use  . Vaping Use: Never used  Substance and Sexual Activity  . Alcohol use: Yes    Alcohol/week: 6.0 standard drinks    Types: 6 Cans of beer per week    Comment: Once a month  . Drug use: Not Currently  . Sexual activity: Yes

## 2020-01-03 NOTE — Addendum Note (Signed)
Addended by: Basil Dess on: 01/03/2020 01:50 PM   Modules accepted: Orders

## 2020-01-03 NOTE — Patient Instructions (Signed)
Plan: CT of the thoracic spine with contrast Appt with Dr. Lawson Fiscal to assess for xyphoidnyia assess for considering a resection.

## 2020-01-06 ENCOUNTER — Other Ambulatory Visit: Payer: Self-pay | Admitting: Specialist

## 2020-01-07 ENCOUNTER — Other Ambulatory Visit: Payer: Self-pay

## 2020-01-07 ENCOUNTER — Ambulatory Visit
Admission: RE | Admit: 2020-01-07 | Discharge: 2020-01-07 | Disposition: A | Discharge status: Home / Self Care | Payer: 59 | Source: Ambulatory Visit | Attending: Specialist | Admitting: Specialist

## 2020-01-07 DIAGNOSIS — R0782 Intercostal pain: Secondary | ICD-10-CM

## 2020-01-07 MED ORDER — IOPAMIDOL (ISOVUE-300) INJECTION 61%
75.0000 mL | Freq: Once | INTRAVENOUS | Status: AC | PRN
  Administered 2020-01-07: 75 mL via INTRAVENOUS

## 2020-01-09 ENCOUNTER — Telehealth: Payer: Self-pay

## 2020-01-09 DIAGNOSIS — R5383 Other fatigue: Secondary | ICD-10-CM

## 2020-01-09 DIAGNOSIS — R400 Somnolence: Secondary | ICD-10-CM

## 2020-01-09 NOTE — Telephone Encounter (Signed)
Per Berniece Salines, DO 11/22/19 Office Note --   His most bothersome symptom today is the fact that he has daytime somnolence- vitamin D level will be done today as well as set up for sleep study to rule out OSA.   Orders in and sent to precert

## 2020-01-13 ENCOUNTER — Telehealth: Payer: Self-pay | Admitting: *Deleted

## 2020-01-13 NOTE — Telephone Encounter (Signed)
PA for in lab sleep study submitted to [facility] via web portal.

## 2020-01-15 ENCOUNTER — Encounter: Payer: Self-pay | Admitting: *Deleted

## 2020-01-15 ENCOUNTER — Encounter: Payer: Self-pay | Admitting: Cardiothoracic Surgery

## 2020-01-15 ENCOUNTER — Other Ambulatory Visit: Payer: Self-pay

## 2020-01-15 ENCOUNTER — Other Ambulatory Visit: Payer: Self-pay | Admitting: *Deleted

## 2020-01-15 ENCOUNTER — Telehealth: Payer: Self-pay

## 2020-01-15 ENCOUNTER — Encounter: Payer: Self-pay | Admitting: Specialist

## 2020-01-15 ENCOUNTER — Telehealth: Payer: Self-pay | Admitting: *Deleted

## 2020-01-15 ENCOUNTER — Institutional Professional Consult (permissible substitution): Payer: 59 | Admitting: Cardiothoracic Surgery

## 2020-01-15 DIAGNOSIS — R0789 Other chest pain: Secondary | ICD-10-CM | POA: Diagnosis not present

## 2020-01-15 NOTE — Telephone Encounter (Signed)
Left message on patients voicemail to please return our call. His sleep study test has been scheduled for 02/14/20 and the packet with all of the information will be mailed to him for this appointment.

## 2020-01-15 NOTE — Progress Notes (Signed)
PCP is Lillard Anes, MD Referring Provider is Jessy Oto, MD  Chief Complaint  Patient presents with  . Consult    Rib pain (R side), CT w/ contrast on 01/07/20    HPI: Patient examined and recent CT scan of chest images personally reviewed and discussed with patient. 49 year old male with history of right thoracotomy exposure for thoracic surgical spine constructive surgery 14 years ago.  He has had postthoracotomy neuropathic pain since surgery.  He also has recurrent low back pain and needs lower thoracic spine reconstructive surgery as well via a posterior approach. More recently he has had trigger point of pain with his xiphoid which is now popping and on CT scan demonstrates a very long length with anterior orientation, a common finding for xyphodynia. He is also had neuropathic pain at the base of his cervical spine which has improved with gabapentin. CT scan of the chest shows no chest wall thoracic rib structural abnormalities in the area of the previous surgery.  Past Medical History:  Diagnosis Date  . BACK PAIN, LUMBAR 08/06/2009   Qualifier: Diagnosis of  By: Regino Schultze CMA (AAMA), Apolonio Schneiders    . CHEST PAIN 05/06/2010   Qualifier: Diagnosis of  By: Sherren Mocha MD, Jory Ee   . Chronic chest wall pain 08/28/2015  . GERD (gastroesophageal reflux disease)   . Hypertension   . Kidney stones   . Mixed hyperlipidemia 09/17/2019  . Neuralgia   . Numerous moles 01/19/2012  . 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 Supraventricular tachycardia ([place]) 09/17/2019    Past Surgical History:  Procedure Laterality Date  . APPENDECTOMY    . BACK SURGERY    . HERNIA REPAIR    . LAPAROSCOPIC GASTROTOMY W/ REPAIR OF ULCER    . right thoracotomy for exposure for the C7-T8 diskectomy  02/06/2007   Dr Arlyce Dice  . TONSILLECTOMY      Family History  Problem Relation Age of Onset  . Hypertension Mother   . Asthma Mother   . Cancer Paternal Uncle        intestinal  . Healthy Sister   .  Healthy Brother   . Scoliosis Daughter   . Healthy Son   . Other Brother        tumors    Social History Social History   Tobacco Use  . Smoking status: Former Smoker    Types: Cigarettes    Quit date: 05/23/1997    Years since quitting: 22.6  . Smokeless tobacco: Current User    Types: Chew  Vaping Use  . Vaping Use: Never used  Substance Use Topics  . Alcohol use: Yes    Alcohol/week: 6.0 standard drinks    Types: 6 Cans of beer per week    Comment: Once a month  . Drug use: Not Currently    Current Outpatient Medications  Medication Sig Dispense Refill  . atorvastatin (LIPITOR) 40 MG tablet Take 1 tablet (40 mg total) by mouth daily. 90 tablet 2  . citalopram (CELEXA) 20 MG tablet Take 1 tablet (20 mg total) by mouth daily. 30 tablet 3  . diclofenac Sodium (VOLTAREN) 1 % GEL Apply 2 g topically 4 (four) times daily. 350 g 2  . diltiazem (CARDIZEM) 120 MG tablet Take 1 tablet (120 mg total) by mouth daily. 90 tablet 3  . gabapentin (NEURONTIN) 300 MG capsule TAKE 1 CAPSULE BY MOUTH AT BEDTIME FOR 5 DAYS THEN 1 TWICE DAILY FOR 25 DAYS 55 capsule 0   No  current facility-administered medications for this visit.    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  This is interfered with his ability to work.  His lower thoracic spine back pain also significant impairs his ability to work.         HEENT    Headache [  ]  Dizziness [  ]  Blurred vision [  ] Glaucoma  [  ]                          Nosebleeds [  ] Painful or loose teeth [  ]        Cardiac :  Chest pain/ pressure [  ]  Resting SOB [  ] exertional SOB [  ]                        Orthopnea [  ]  Pedal edema  [  ]  Palpitations [yes] Syncope/presyncope [ ]                          Paroxysmal nocturnal dyspnea [  ]         Pulmonary : cough [  ]  wheezing [  ]  Hemoptysis [  ] Sputum [  ] Snoring [  ]                              Pneumothorax [  ]  Sleep apnea [  ]        GI : Vomiting [  ]  Dysphagia [  ]  Melena  [  ]  Abdominal pain [  ] BRBPR [  ]              Heart burn [  ]  Constipation [  ] Diarrhea  [  ] Colonoscopy [   ]        GU : Hematuria [  ]  Dysuria [  ]  Nocturia [  ] UTI's [  ]        Vascular : Claudication [  ]  Rest pain [  ]  DVT [  ] Vein stripping [  ] leg ulcers [  ]                          TIA [  ] Stroke [  ]  Varicose veins [  ]        NEURO :  Headaches  [  ] Seizures [  ] Vision changes [  ] Paresthesias [  ]  Musculoskeletal :  Arthritis [  ] Gout  [  ]  Back pain [yes]  Joint pain [  ]        Skin :  Rash [  ]  Melanoma [  ] Sores [  ]        Heme : Bleeding problems [  ]Clotting Disorders [  ] Anemia [  ]Blood Transfusion [ ]         Endocrine : Diabetes [  ] Heat or Cold intolerance [  ] Polyuria [  ]excessive thirst [ ]         Psych : Depression [  ]  Anxiety [  ]  Psych hospitalizations [  ] Memory change [  ]                                                                            BP (!) 137/94 (BP Location: Right Arm, Patient Position: Sitting, Cuff Size: Normal)   Pulse 83   Temp 97.9 F (36.6 C) (Temporal)   Resp 16   Ht 5' 10.5" (1.791 m)   Wt 195 lb 14.2 oz (88.9 kg)   SpO2 97% Comment: ra  BMI 27.71 kg/m  Physical Exam       Exam    General- alert and comfortable    Neck- no JVD, no cervical adenopathy palpable, no carotid bruit   Lungs- clear without rales, wheezes.  Well-healed right thoracotomy incision. Instability/dislocation of the xiphoid process with palpation and waistband maneuver.  Xiphoid tender to touch.   Cor- regular rate and rhythm, no murmur , gallop   Abdomen- soft, non-tender   Extremities - warm, non-tender,  minimal edema   Neuro- oriented, appropriate, no focal weakness   Diagnostic Tests: CT scan images reviewed as noted above  Impression: Chest wall pain following right thoracotomy 2008 for exposure of the thoracic spine and spinal reconstruction.  Most of this is neuropathic pain and I have advised the patient increase his gabapentin to 300 mg 3 times daily.  The pain center to the xiphoid is probably related to a dislocation of the cartilage in this should be improved with xiphoid excision.  This procedure will be set up as an outpatient at [facility] in the next week.  Plan: Return for outpatient surgery-removal of xiphoid process.  Increase gabapentin to 3 times daily for neuropathic postthoracotomy pain on the right side.  I discussed the procedure of xiphoid excision using local anesthesia and conscious sedation, the risk of infection and the expected recovery.  He understand that it is not a 100% guarantee that this will improve his neuropathic pain but believe it is likely the  procedure will help the patient.   Len Childs, MD [facility] [phone]

## 2020-01-15 NOTE — Telephone Encounter (Signed)
Staff message sent to Kevin Jackson ok to schedule sleep study. PA received from [facility]. Reference [id number].

## 2020-01-15 NOTE — Telephone Encounter (Signed)
-----   Message from Lauralee Evener, [place] sent at 01/15/2020 11:01 AM EDT ----- Regarding: RE: split night Ok to schedule split night sleep study. Auth received from [facility]. ----- Message ----- From: Bobby Rumpf, CMA Sent: 01/09/2020  11:25 AM EDT To: Cv Div Sleep Studies Subject: split night                                    Pt needs precert for split night. Order is in

## 2020-01-17 NOTE — Telephone Encounter (Signed)
Spoke to the patient just now and let him know about his sleep study appointment. He verbalizes understanding and thanks me for the call back.

## 2020-01-20 NOTE — Progress Notes (Signed)
[place], [place] [address] Phone: [phone] Fax: [phone]      Your procedure is scheduled on Thursday, January 23, 2020.  Report to [facility] Main Entrance "A" at 8:15 A.M., and check in at the Admitting office.  Call this number if you have problems the morning of surgery:  [phone]  Call [phone] if you have any questions prior to your surgery date Monday-Friday 8am-4pm    Remember:  Do not eat after midnight the night before your surgery    Take these medicines the morning of surgery with A SIP OF WATER:  atorvastatin (LIPITOR) gabapentin (NEURONTIN)   As of today, STOP taking any Aspirin (unless otherwise instructed by your surgeon) Aleve, Naproxen, Ibuprofen, Motrin, Advil, Goody's, BC's, all herbal medications, fish oil, and all vitamins.                      Do not wear jewelry.            Do not wear lotions, powders, colognes, or deodorant.            Men may shave face and neck.            Do not bring valuables to the hospital.            [facility] is not responsible for any belongings or valuables.  Do NOT Smoke (Tobacco/Vaping) or drink Alcohol 24 hours prior to your procedure If you use a CPAP at night, you may bring all equipment for your overnight stay.   Contacts, glasses, dentures or bridgework may not be worn into surgery.      For patients admitted to the hospital, discharge time will be determined by your treatment team.   Patients discharged the day of surgery will not be allowed to drive home, and someone needs to stay with them for 24 hours.    Special instructions:   [place]- Preparing For Surgery  Before surgery, you can play an important role. Because skin is not sterile, your skin needs to be as free of germs as possible. You can reduce the number of germs on your skin by washing with CHG (chlorahexidine gluconate) Soap before surgery.  CHG is  an antiseptic cleaner which kills germs and bonds with the skin to continue killing germs even after washing.    Oral Hygiene is also important to reduce your risk of infection.  Remember - BRUSH YOUR TEETH THE MORNING OF SURGERY WITH YOUR REGULAR TOOTHPASTE  Please do not use if you have an allergy to CHG or antibacterial soaps. If your skin becomes reddened/irritated stop using the CHG.  Do not shave (including legs and underarms) for at least 48 hours prior to first CHG shower. It is OK to shave your face.  Please follow these instructions carefully.   1. Shower the NIGHT BEFORE SURGERY and the MORNING OF SURGERY with CHG Soap.   2. If you chose to wash your hair, wash your hair first as usual with your normal shampoo.  3. After you shampoo, rinse your hair and body thoroughly to remove the shampoo.  4. Use CHG as you would any other liquid soap. You can apply CHG directly to the skin and wash gently with a scrungie or a clean washcloth.   5. Apply the CHG Soap to your body ONLY FROM THE NECK DOWN.  Do not use on open wounds or  open sores. Avoid contact with your eyes, ears, mouth and genitals (private parts). Wash Face and genitals (private parts)  with your normal soap.   6. Wash thoroughly, paying special attention to the area where your surgery will be performed.  7. Thoroughly rinse your body with warm water from the neck down.  8. DO NOT shower/wash with your normal soap after using and rinsing off the CHG Soap.  9. Pat yourself dry with a CLEAN TOWEL.  10. Wear CLEAN PAJAMAS to bed the night before surgery  11. Place CLEAN SHEETS on your bed the night of your first shower and DO NOT SLEEP WITH PETS.   Day of Surgery: Wear Clean/Comfortable clothing the morning of surgery Do not apply any deodorants/lotions.   Remember to brush your teeth WITH YOUR REGULAR TOOTHPASTE.   Please read over the following fact sheets that you were given.

## 2020-01-21 ENCOUNTER — Other Ambulatory Visit: Payer: Self-pay

## 2020-01-21 ENCOUNTER — Encounter (HOSPITAL_COMMUNITY)
Admission: RE | Admit: 2020-01-21 | Discharge: 2020-01-21 | Disposition: A | Discharge status: Home / Self Care | Payer: 59 | Source: Ambulatory Visit | Attending: Cardiothoracic Surgery | Admitting: Cardiothoracic Surgery

## 2020-01-21 ENCOUNTER — Other Ambulatory Visit (HOSPITAL_COMMUNITY)
Admission: RE | Admit: 2020-01-21 | Discharge: 2020-01-21 | Disposition: A | Discharge status: Home / Self Care | Payer: 59 | Source: Ambulatory Visit | Attending: Cardiothoracic Surgery | Admitting: Cardiothoracic Surgery

## 2020-01-21 ENCOUNTER — Encounter (HOSPITAL_COMMUNITY): Payer: Self-pay

## 2020-01-21 DIAGNOSIS — Z20822 Contact with and (suspected) exposure to covid-19: Secondary | ICD-10-CM | POA: Insufficient documentation

## 2020-01-21 DIAGNOSIS — Z01818 Encounter for other preprocedural examination: Secondary | ICD-10-CM | POA: Insufficient documentation

## 2020-01-21 DIAGNOSIS — R0789 Other chest pain: Secondary | ICD-10-CM | POA: Diagnosis not present

## 2020-01-21 HISTORY — DX: Anxiety disorder, unspecified: F41.9

## 2020-01-21 HISTORY — DX: Depression, unspecified: F32.A

## 2020-01-21 LAB — COMPREHENSIVE METABOLIC PANEL
ALT: 31 U/L (ref 0–44)
AST: 21 U/L (ref 15–41)
Albumin: 4.4 g/dL (ref 3.5–5.0)
Alkaline Phosphatase: 71 U/L (ref 38–126)
Anion gap: 11 (ref 5–15)
BUN: 14 mg/dL (ref 6–20)
CO2: 24 mmol/L (ref 22–32)
Calcium: 9.4 mg/dL (ref 8.9–10.3)
Chloride: 103 mmol/L (ref 98–111)
Creatinine, Ser: 1.04 mg/dL (ref 0.61–1.24)
GFR calc Af Amer: >60 mL/min (ref >60)
GFR calc non Af Amer: >60 mL/min (ref >60)
Glucose, Bld: 100 mg/dL — ABNORMAL HIGH (ref 70–99)
Potassium: 3.6 mmol/L (ref 3.5–5.1)
Sodium: 138 mmol/L (ref 135–145)
Total Bilirubin: 1.3 mg/dL — ABNORMAL HIGH (ref 0.3–1.2)
Total Protein: 7.1 g/dL (ref 6.5–8.1)

## 2020-01-21 LAB — TYPE AND SCREEN
ABO/RH(D): A POS
Antibody Screen: NEGATIVE

## 2020-01-21 LAB — CBC
HCT: 46.9 % (ref 39.0–52.0)
Hemoglobin: 15.9 g/dL (ref 13.0–17.0)
MCH: 30.8 pg (ref 26.0–34.0)
MCHC: 33.9 g/dL (ref 30.0–36.0)
MCV: 90.9 fL (ref 80.0–100.0)
Platelets: 266 10*3/uL (ref 150–400)
RBC: 5.16 MIL/uL (ref 4.22–5.81)
RDW: 13.2 % (ref 11.5–15.5)
WBC: 8.4 10*3/uL (ref 4.0–10.5)
nRBC: 0 % (ref 0.0–0.2)

## 2020-01-21 LAB — PROTIME-INR
INR: 1.1 (ref 0.8–1.2)
Prothrombin Time: 13.4 seconds (ref 11.4–15.2)

## 2020-01-21 LAB — APTT: aPTT: 31 seconds (ref 24–36)

## 2020-01-21 LAB — SURGICAL PCR SCREEN
MRSA, PCR: NEGATIVE
Staphylococcus aureus: NEGATIVE

## 2020-01-21 LAB — SARS CORONAVIRUS 2 (TAT 6-24 HRS): SARS Coronavirus 2: NEGATIVE

## 2020-01-21 NOTE — Anesthesia Preprocedure Evaluation (Addendum)
Anesthesia Evaluation  Patient identified by MRN, date of birth, ID band Patient awake    Reviewed: Allergy & Precautions, NPO status , Patient's Chart, lab work & pertinent test results  Airway Mallampati: III  TM Distance: >3 FB Neck ROM: Full    Dental  (+) Dental Advisory Given, Teeth Intact, Missing   Pulmonary neg pulmonary ROS, former smoker,    Pulmonary exam normal breath sounds clear to auscultation       Cardiovascular hypertension, Pt. on medications Normal cardiovascular exam Rhythm:Regular Rate:Normal     Neuro/Psych PSYCHIATRIC DISORDERS Anxiety Depression negative neurological ROS     GI/Hepatic Neg liver ROS, GERD  ,  Endo/Other  negative endocrine ROS  Renal/GU Renal disease     Musculoskeletal negative musculoskeletal ROS (+)   Abdominal   Peds  Hematology negative hematology ROS (+)   Anesthesia Other Findings   Reproductive/Obstetrics                            Anesthesia Physical Anesthesia Plan  ASA: II  Anesthesia Plan: General   Post-op Pain Management:    Induction: Intravenous  PONV Risk Score and Plan: 3 and Treatment may vary due to age or medical condition, Ondansetron and Dexamethasone  Airway Management Planned: LMA  Additional Equipment: None  Intra-op Plan:   Post-operative Plan: Extubation in OR  Informed Consent: I have reviewed the patients History and Physical, chart, labs and discussed the procedure including the risks, benefits and alternatives for the proposed anesthesia with the patient or authorized representative who has indicated his/her understanding and acceptance.     Dental advisory given  Plan Discussed with: CRNA  Anesthesia Plan Comments:        Anesthesia Quick Evaluation

## 2020-01-21 NOTE — Progress Notes (Signed)
Anesthesia Chart Review:  Case: [id number] Date/Time: 01/22/20 0815   Procedure: EXCISION OF XIPHOID (N/A )   Anesthesia type: Monitor Anesthesia Care   Pre-op diagnosis: Xiphoid pain   Location: [facility] ROOM 14 / [place]   Surgeons: Ivin Poot, MD      DISCUSSION: Patient is a 49 year old male scheduled for the above procedure. 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 Xyphoid excision recommended to improve his symptoms.   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    He is on Cardizem for palpitations (PVCs, PAT) per Dr. Harriet Masson. Last evaluation 11/22/19. He has a pending sleep study for evaluation of daytime somnolence.   01/21/2020 presurgical COVID-19 test and CXR are still in process.  Anesthesia team to evaluate on the day of surgery.    VS: BP 124/86   Pulse 79   Temp 36.8 C (Oral)   Resp 18   Ht 5\' 10"  (1.778 m)   Wt 88.7 kg   SpO2 99%   BMI 28.07 kg/m     PROVIDERS: Lillard Anes, MD is PCP  - Berniece Salines, DO is cardiologist. Last visit 11/22/19 for palpitations follow-up. Long term monitor showed symptomatic PVCs and PAT. Symptoms improved (with some breakthrough palpitations) with Cardizem. No changes in dose made. He did reporte some daytime somnolence, so sleep study ordered but has not been done yet. Previous testing with Candee Furbish, MD in 2017.    LABS: Labs reviewed: Acceptable for surgery. (all labs ordered are listed, but only abnormal results are displayed)  Labs Reviewed  COMPREHENSIVE METABOLIC PANEL - Abnormal; Notable for the following components:      Result Value   Glucose, Bld 100 (*)     Total Bilirubin 1.3 (*)    All other components within normal limits  SURGICAL PCR SCREEN  APTT  CBC  PROTIME-INR  TYPE AND SCREEN     IMAGES: CXR 01/21/20: In process.  CT Chest 01/07/20: IMPRESSION: 1. Skin markers were placed overlying the anterior right eighth rib costal cartilage and distal xiphoid. There is no underlying chest wall abnormality or fracture. 2. 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 3. Unchanged mild splenomegaly. 4. Nonobstructive right nephrolithiasis.   EKG: 01/21/20: NSR   CV: Long term cardiac monitor 09/19/19-10/03/19: Indication: Palpitations - The minimum heart rate was 48 bpm, maximum heart rate was 179 bpm, and average heart rate was 72 bpm. Predominant underlying rhythm was Sinus Rhythm. - 3 Supraventricular Tachycardia runs occurred, the run with the fastest interval lasting 13.6 seconds with a maximum rate of 179 bpm (average 148 bpm); the run with the fastest interval was also the longest. - Premature atrial complexes were rare (less than 1%). - Premature Ventricular complexes were rare (less than 1%). - No ventricular tachycardia, pauses, No AV block and no atrial fibrillation present. - 101 patient triggered event noted all associated with premature ventricular complex.  37 diary event noted 1 associated with supraventricular tachycardia and the remaining associated with premature ventricular complex. Conclusion: This study is remarkable for the following:  1.  Symptomatic premature ventricular complex   2.  Symptomatic paroxysmal SVT, which is likely atrial tachycardia with variable block  Echo 08/27/15: Study Conclusions  - Left ventricle: Global LV longitudinal strain is normal at -21.1%  The cavity size was normal. Systolic function was normal. The  estimated ejection fraction was in the range of 60% to 65%. Wall  motion was normal; there were no regional wall motion   abnormalities. There was an increased relative contribution of  atrial contraction to ventricular filling. Doppler parameters are  consistent with abnormal left ventricular relaxation (grade 1  diastolic dysfunction).  - Mitral valve: There was mild regurgitation.  - Right ventricle: The cavity size was mildly dilated. Wall  thickness was normal.  Impressions:  - Normal LVF with grade 1 diastolic dysfunction. Mild MR.    ETT 08/18/15:  There was no ST segment deviation noted during stress.  No T wave inversion was noted during stress.  Negative, adequate stress test.     Past Medical History:  Diagnosis Date  . Anxiety   . BACK PAIN, LUMBAR 08/06/2009   Qualifier: Diagnosis of  By: Regino Schultze CMA (AAMA), Apolonio Schneiders    . CHEST PAIN 05/06/2010   Qualifier: Diagnosis of  By: Sherren Mocha MD, Jory Ee   . Chronic chest wall pain 08/28/2015  . Depression   . GERD (gastroesophageal reflux disease)   . Hypertension   . Kidney stones   . Mixed hyperlipidemia 09/17/2019  . Neuralgia   . Numerous moles 01/19/2012  . 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 Supraventricular tachycardia ([place]) 09/17/2019    Past Surgical History:  Procedure Laterality Date  . APPENDECTOMY    . BACK SURGERY    . HERNIA REPAIR    . LAPAROSCOPIC GASTROTOMY W/ REPAIR OF ULCER    . right thoracotomy for exposure for the C7-T8 diskectomy  02/06/2007   Dr Arlyce Dice  . TONSILLECTOMY      MEDICATIONS: . atorvastatin (LIPITOR) 40 MG tablet  . citalopram (CELEXA) 20 MG tablet  . diclofenac Sodium (VOLTAREN) 1 % GEL  . diltiazem (CARDIZEM) 120 MG tablet  . gabapentin (NEURONTIN) 300 MG capsule   No current facility-administered medications for this encounter.    Myra Gianotti, PA-C Surgical Short Stay/Anesthesiology [facility] Phone [phone] [facility] Phone [phone] 01/21/2020 3:40 PM

## 2020-01-21 NOTE — Progress Notes (Signed)
PCP - DR Henrene Pastor   IN [facility] Cardiologist - DT TODD IN [place]    Chest x-ray - TODAY EKG - TODAY Stress Test - 3/17 ECHO - 7/17 Cardiac Cath - NA  Sleep Study - GOT ONE SCHEDULED CPAP - NA  Blood Thinner Instructions:NA     Aspirin Instructions:STOP   COVID TEST- FOR TODAY   Anesthesia review: HX HTN  Patient denies shortness of breath, fever, cough and chest pain at PAT appointment   All instructions explained to the patient, with a verbal understanding of the material. Patient agrees to go over the instructions while at home for a better understanding. Patient also instructed to self quarantine after being tested for COVID-19. The opportunity to ask questions was provided.

## 2020-01-22 ENCOUNTER — Ambulatory Visit (HOSPITAL_COMMUNITY): Payer: 59 | Admitting: Vascular Surgery

## 2020-01-22 ENCOUNTER — Ambulatory Visit (HOSPITAL_COMMUNITY)
Admission: RE | Admit: 2020-01-22 | Discharge: 2020-01-22 | Disposition: A | Discharge status: Home / Self Care | Payer: 59 | Attending: Cardiothoracic Surgery | Admitting: Cardiothoracic Surgery

## 2020-01-22 ENCOUNTER — Encounter (HOSPITAL_COMMUNITY): Payer: Self-pay | Admitting: Cardiothoracic Surgery

## 2020-01-22 ENCOUNTER — Other Ambulatory Visit: Payer: Self-pay | Admitting: *Deleted

## 2020-01-22 ENCOUNTER — Encounter (HOSPITAL_COMMUNITY)
Admission: RE | Disposition: A | Discharge status: Home / Self Care | Payer: Self-pay | Source: Home / Self Care | Attending: Cardiothoracic Surgery

## 2020-01-22 ENCOUNTER — Ambulatory Visit (HOSPITAL_COMMUNITY): Payer: 59 | Admitting: Anesthesiology

## 2020-01-22 DIAGNOSIS — Z87891 Personal history of nicotine dependence: Secondary | ICD-10-CM | POA: Diagnosis not present

## 2020-01-22 DIAGNOSIS — R0789 Other chest pain: Secondary | ICD-10-CM

## 2020-01-22 DIAGNOSIS — G8918 Other acute postprocedural pain: Secondary | ICD-10-CM

## 2020-01-22 DIAGNOSIS — R072 Precordial pain: Secondary | ICD-10-CM | POA: Diagnosis not present

## 2020-01-22 DIAGNOSIS — I10 Essential (primary) hypertension: Secondary | ICD-10-CM | POA: Diagnosis not present

## 2020-01-22 HISTORY — PX: MASS EXCISION: SHX2000

## 2020-01-22 SURGERY — EXCISION MASS
Anesthesia: General

## 2020-01-22 MED ORDER — PROPOFOL 10 MG/ML IV BOLUS
INTRAVENOUS | Status: DC | PRN
  Administered 2020-01-22: 200 mg via INTRAVENOUS

## 2020-01-22 MED ORDER — FENTANYL CITRATE (PF) 250 MCG/5ML IJ SOLN
INTRAMUSCULAR | Status: AC
  Filled 2020-01-22: qty 5

## 2020-01-22 MED ORDER — MIDAZOLAM HCL 5 MG/5ML IJ SOLN
INTRAMUSCULAR | Status: DC | PRN
  Administered 2020-01-22: 2 mg via INTRAVENOUS

## 2020-01-22 MED ORDER — MEPERIDINE HCL 25 MG/ML IJ SOLN: 6.2500 mg | INTRAMUSCULAR | Status: DC | PRN

## 2020-01-22 MED ORDER — EPHEDRINE 5 MG/ML INJ
INTRAVENOUS | Status: AC
  Filled 2020-01-22: qty 10

## 2020-01-22 MED ORDER — PROMETHAZINE HCL 25 MG/ML IJ SOLN: 6.2500 mg | INTRAMUSCULAR | Status: DC | PRN

## 2020-01-22 MED ORDER — KETOROLAC TROMETHAMINE 30 MG/ML IJ SOLN
INTRAMUSCULAR | Status: AC
  Filled 2020-01-22: qty 1

## 2020-01-22 MED ORDER — MIDAZOLAM HCL 2 MG/2ML IJ SOLN
INTRAMUSCULAR | Status: AC
  Filled 2020-01-22: qty 2

## 2020-01-22 MED ORDER — SODIUM CHLORIDE 0.9% FLUSH: 3.0000 mL | INTRAVENOUS | Status: DC | PRN

## 2020-01-22 MED ORDER — ACETAMINOPHEN 650 MG RE SUPP: 650.0000 mg | RECTAL | Status: DC | PRN

## 2020-01-22 MED ORDER — DEXAMETHASONE SODIUM PHOSPHATE 10 MG/ML IJ SOLN
INTRAMUSCULAR | Status: DC | PRN
  Administered 2020-01-22: 10 mg via INTRAVENOUS

## 2020-01-22 MED ORDER — OXYCODONE-ACETAMINOPHEN 10-325 MG PO TABS: 1.0000 | ORAL_TABLET | Freq: Four times a day (QID) | ORAL | 0 refills | Status: DC | PRN

## 2020-01-22 MED ORDER — OXYCODONE HCL 5 MG/5ML PO SOLN: 5.0000 mg | Freq: Once | ORAL | Status: AC | PRN

## 2020-01-22 MED ORDER — EPHEDRINE SULFATE-NACL 50-0.9 MG/10ML-% IV SOSY
PREFILLED_SYRINGE | INTRAVENOUS | Status: DC | PRN
  Administered 2020-01-22: 5 mg via INTRAVENOUS

## 2020-01-22 MED ORDER — PROPOFOL 10 MG/ML IV BOLUS
INTRAVENOUS | Status: AC
  Filled 2020-01-22: qty 40

## 2020-01-22 MED ORDER — FENTANYL CITRATE (PF) 100 MCG/2ML IJ SOLN
25.0000 ug | INTRAMUSCULAR | Status: DC | PRN
  Administered 2020-01-22 (×2): 50 ug via INTRAVENOUS

## 2020-01-22 MED ORDER — FENTANYL CITRATE (PF) 250 MCG/5ML IJ SOLN
INTRAMUSCULAR | Status: DC | PRN
Start: 2020-01-22 — End: 2020-01-22
  Administered 2020-01-22 (×4): 25 ug via INTRAVENOUS

## 2020-01-22 MED ORDER — DEXAMETHASONE SODIUM PHOSPHATE 10 MG/ML IJ SOLN
INTRAMUSCULAR | Status: AC
  Filled 2020-01-22: qty 1

## 2020-01-22 MED ORDER — BUPIVACAINE HCL (PF) 0.5 % IJ SOLN
INTRAMUSCULAR | Status: AC
  Filled 2020-01-22: qty 30

## 2020-01-22 MED ORDER — CEFAZOLIN SODIUM-DEXTROSE 2-4 GM/100ML-% IV SOLN
INTRAVENOUS | Status: AC
  Filled 2020-01-22: qty 100

## 2020-01-22 MED ORDER — SODIUM CHLORIDE 0.9 % IV SOLN: 250.0000 mL | INTRAVENOUS | Status: DC | PRN

## 2020-01-22 MED ORDER — CEFAZOLIN SODIUM-DEXTROSE 2-4 GM/100ML-% IV SOLN
2.0000 g | INTRAVENOUS | Status: AC
  Administered 2020-01-22: 2 g via INTRAVENOUS

## 2020-01-22 MED ORDER — CHLORHEXIDINE GLUCONATE 0.12 % MT SOLN
OROMUCOSAL | Status: AC
  Administered 2020-01-22: 15 mL via OROMUCOSAL
  Filled 2020-01-22: qty 15

## 2020-01-22 MED ORDER — FENTANYL CITRATE (PF) 100 MCG/2ML IJ SOLN
INTRAMUSCULAR | Status: AC
  Filled 2020-01-22: qty 2

## 2020-01-22 MED ORDER — ONDANSETRON HCL 4 MG/2ML IJ SOLN
INTRAMUSCULAR | Status: AC
  Filled 2020-01-22: qty 2

## 2020-01-22 MED ORDER — OXYCODONE HCL 5 MG PO TABS
ORAL_TABLET | ORAL | Status: AC
  Filled 2020-01-22: qty 1

## 2020-01-22 MED ORDER — OXYCODONE HCL 5 MG PO TABS
5.0000 mg | ORAL_TABLET | Freq: Once | ORAL | Status: AC | PRN
  Administered 2020-01-22: 5 mg via ORAL

## 2020-01-22 MED ORDER — KETOROLAC TROMETHAMINE 30 MG/ML IJ SOLN
30.0000 mg | Freq: Once | INTRAMUSCULAR | Status: AC
  Administered 2020-01-22: 30 mg via INTRAVENOUS

## 2020-01-22 MED ORDER — LACTATED RINGERS IV SOLN: INTRAVENOUS | Status: DC

## 2020-01-22 MED ORDER — OXYCODONE HCL 5 MG PO TABS
5.0000 mg | ORAL_TABLET | ORAL | Status: DC | PRN
  Administered 2020-01-22: 5 mg via ORAL

## 2020-01-22 MED ORDER — SODIUM CHLORIDE 0.9% FLUSH: 3.0000 mL | Freq: Two times a day (BID) | INTRAVENOUS | Status: DC

## 2020-01-22 MED ORDER — ACETAMINOPHEN 325 MG PO TABS: 650.0000 mg | ORAL_TABLET | ORAL | Status: DC | PRN

## 2020-01-22 MED ORDER — BUPIVACAINE HCL 0.5 % IJ SOLN
INTRAMUSCULAR | Status: DC | PRN
  Administered 2020-01-22: 20 mL

## 2020-01-22 MED ORDER — ORAL CARE MOUTH RINSE: 15.0000 mL | Freq: Once | OROMUCOSAL | Status: AC

## 2020-01-22 MED ORDER — ONDANSETRON HCL 4 MG/2ML IJ SOLN
INTRAMUSCULAR | Status: DC | PRN
  Administered 2020-01-22: 4 mg via INTRAVENOUS

## 2020-01-22 MED ORDER — CHLORHEXIDINE GLUCONATE 0.12 % MT SOLN: 15.0000 mL | Freq: Once | OROMUCOSAL | Status: AC

## 2020-01-22 MED ORDER — 0.9 % SODIUM CHLORIDE (POUR BTL) OPTIME
TOPICAL | Status: DC | PRN
  Administered 2020-01-22: 2000 mL

## 2020-01-22 MED ORDER — LIDOCAINE 2% (20 MG/ML) 5 ML SYRINGE
INTRAMUSCULAR | Status: DC | PRN
  Administered 2020-01-22: 100 mg via INTRAVENOUS

## 2020-01-22 SURGICAL SUPPLY — 50 items
BENZOIN TINCTURE PRP APPL 2/3 (GAUZE/BANDAGES/DRESSINGS) ×3 IMPLANT
BLADE CLIPPER SURG (BLADE) ×3 IMPLANT
CANISTER SUCT 3000ML PPV (MISCELLANEOUS) ×3 IMPLANT
CATH THORACIC 28FR (CATHETERS) IMPLANT
CATH THORACIC 28FR RT ANG (CATHETERS) IMPLANT
CATH THORACIC 36FR (CATHETERS) IMPLANT
CLOSURE WOUND 1/2 X4 (GAUZE/BANDAGES/DRESSINGS) ×1
CNTNR URN SCR LID CUP LEK RST (MISCELLANEOUS) IMPLANT
CONT SPEC 4OZ STRL OR WHT (MISCELLANEOUS)
COVER SURGICAL LIGHT HANDLE (MISCELLANEOUS) ×6 IMPLANT
DERMABOND ADVANCED (GAUZE/BANDAGES/DRESSINGS) ×2
DERMABOND ADVANCED .7 DNX12 (GAUZE/BANDAGES/DRESSINGS) ×1 IMPLANT
DRAIN CHANNEL 28F RND 3/8 FF (WOUND CARE) ×3 IMPLANT
DRAPE HALF SHEET 40X57 (DRAPES) ×3 IMPLANT
DRAPE LAPAROSCOPIC ABDOMINAL (DRAPES) ×3 IMPLANT
DRSG AQUACEL AG ADV 3.5X 6 (GAUZE/BANDAGES/DRESSINGS) ×3 IMPLANT
DRSG TEGADERM 4X4.75 (GAUZE/BANDAGES/DRESSINGS) ×3 IMPLANT
ELECT BLADE 4.0 EZ CLEAN MEGAD (MISCELLANEOUS) ×3
ELECT REM PT RETURN 9FT ADLT (ELECTROSURGICAL) ×3
ELECTRODE BLDE 4.0 EZ CLN MEGD (MISCELLANEOUS) ×1 IMPLANT
ELECTRODE REM PT RTRN 9FT ADLT (ELECTROSURGICAL) ×1 IMPLANT
GAUZE SPONGE 4X4 12PLY STRL (GAUZE/BANDAGES/DRESSINGS) ×3 IMPLANT
GLOVE BIO SURGEON STRL SZ7.5 (GLOVE) ×6 IMPLANT
HEMOSTAT POWDER SURGIFOAM 1G (HEMOSTASIS) IMPLANT
KIT BASIN OR (CUSTOM PROCEDURE TRAY) ×3 IMPLANT
KIT TURNOVER KIT B (KITS) ×3 IMPLANT
NEEDLE HYPO 25GX1X1/2 BEV (NEEDLE) ×3 IMPLANT
NS IRRIG 1000ML POUR BTL (IV SOLUTION) ×3 IMPLANT
PACK CHEST (CUSTOM PROCEDURE TRAY) ×3 IMPLANT
PAD ARMBOARD 7.5X6 YLW CONV (MISCELLANEOUS) ×6 IMPLANT
PAD ELECT DEFIB RADIOL ZOLL (MISCELLANEOUS) ×3 IMPLANT
STRIP CLOSURE SKIN 1/2X4 (GAUZE/BANDAGES/DRESSINGS) ×2 IMPLANT
SUT SILK 2 0 SH CR/8 (SUTURE) ×3 IMPLANT
SUT VIC AB 1 CTX 18 (SUTURE) ×6 IMPLANT
SUT VIC AB 1 CTX 36 (SUTURE) ×3
SUT VIC AB 1 CTX36XBRD ANBCTR (SUTURE) ×1 IMPLANT
SUT VIC AB 2-0 CT1 36 (SUTURE) ×3 IMPLANT
SUT VIC AB 3-0 X1 27 (SUTURE) ×6 IMPLANT
SWAB COLLECTION DEVICE MRSA (MISCELLANEOUS) IMPLANT
SWAB CULTURE ESWAB REG 1ML (MISCELLANEOUS) IMPLANT
SYR 10ML LL (SYRINGE) IMPLANT
SYR 50ML SLIP (SYRINGE) IMPLANT
SYR CONTROL 10ML LL (SYRINGE) ×3 IMPLANT
SYSTEM SAHARA CHEST DRAIN ATS (WOUND CARE) IMPLANT
TOWEL GREEN STERILE (TOWEL DISPOSABLE) ×3 IMPLANT
TOWEL GREEN STERILE FF (TOWEL DISPOSABLE) ×3 IMPLANT
TRAP SPECIMEN MUCUS 40CC (MISCELLANEOUS) IMPLANT
TRAY FOLEY MTR SLVR 14FR STAT (SET/KITS/TRAYS/PACK) ×3 IMPLANT
TRAY FOLEY SLVR 16FR TEMP STAT (SET/KITS/TRAYS/PACK) ×3 IMPLANT
WATER STERILE IRR 1000ML POUR (IV SOLUTION) ×6 IMPLANT

## 2020-01-22 NOTE — Discharge Instructions (Signed)
Leave surgical dressing on for 48hrs and keep dry  No driving for 24 hrs Resume normal activities in 5 days Office will call for postop wound check in 1-2 weeks Pain med for immediate postop pain  will be called to your pharmacy

## 2020-01-22 NOTE — Anesthesia Procedure Notes (Signed)
Procedure Name: LMA Insertion Performed by: Valda Favia, CRNA Pre-anesthesia Checklist: Patient identified, Emergency Drugs available, Suction available and Patient being monitored Patient Re-evaluated:Patient Re-evaluated prior to induction Oxygen Delivery Method: Circle System Utilized Preoxygenation: Pre-oxygenation with 100% oxygen Induction Type: IV induction Ventilation: Mask ventilation without difficulty LMA: LMA inserted LMA Size: 5.0 Number of attempts: 1 Airway Equipment and Method: Bite block Placement Confirmation: positive ETCO2 Tube secured with: Tape Dental Injury: Teeth and Oropharynx as per pre-operative assessment

## 2020-01-22 NOTE — H&P (Signed)
PCP is Lillard Anes, MD Referring Provider is No ref. provider found  No chief complaint on file.   HPI: Patient examined and recent CT scan of chest images personally reviewed and discussed with patient. 49 year old male with history of right thoracotomy exposure for thoracic surgical spine constructive surgery 14 years ago.  He has had postthoracotomy neuropathic pain since surgery.  He also has recurrent low back pain and needs lower thoracic spine reconstructive surgery as well via a posterior approach. More recently he has had trigger point of pain with his xiphoid which is now popping and on CT scan demonstrates a very long length with anterior orientation, a common finding for xyphodynia. He is also had neuropathic pain at the base of his cervical spine which has improved with gabapentin. CT scan of the chest shows no chest wall thoracic rib structural abnormalities in the area of the previous surgery.  Past Medical History:  Diagnosis Date  . Anxiety   . BACK PAIN, LUMBAR 08/06/2009   Qualifier: Diagnosis of  By: Regino Schultze CMA (AAMA), Apolonio Schneiders    . CHEST PAIN 05/06/2010   Qualifier: Diagnosis of  By: Sherren Mocha MD, Jory Ee   . Chronic chest wall pain 08/28/2015  . Depression   . GERD (gastroesophageal reflux disease)   . Hypertension   . Kidney stones   . Mixed hyperlipidemia 09/17/2019  . Neuralgia   . Numerous moles 01/19/2012  . 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 Supraventricular tachycardia ([place]) 09/17/2019    Past Surgical History:  Procedure Laterality Date  . APPENDECTOMY    . BACK SURGERY    . HERNIA REPAIR    . LAPAROSCOPIC GASTROTOMY W/ REPAIR OF ULCER    . right thoracotomy for exposure for the C7-T8 diskectomy  02/06/2007   Dr Arlyce Dice  . TONSILLECTOMY      Family History  Problem Relation Age of Onset  . Hypertension Mother   . Asthma Mother   . Cancer Paternal Uncle        intestinal  . Healthy Sister   . Healthy Brother   . Scoliosis Daughter   .  Healthy Son   . Other Brother        tumors    Social History Social History   Tobacco Use  . Smoking status: Former Smoker    Types: Cigarettes    Quit date: 05/23/1997    Years since quitting: 22.6  . Smokeless tobacco: Current User    Types: Chew  Vaping Use  . Vaping Use: Never used  Substance Use Topics  . Alcohol use: Yes    Alcohol/week: 6.0 standard drinks    Types: 6 Cans of beer per week    Comment: Once a month  . Drug use: Not Currently    Current Facility-Administered Medications  Medication Dose Route Frequency Provider Last Rate Last Admin  . ceFAZolin (ANCEF) 2-4 GM/100ML-% IVPB           . ceFAZolin (ANCEF) IVPB 2g/100 mL premix  2 g Intravenous 30 min Pre-Op [place] Gum, Collier Salina, MD      . 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  This is interfered with his ability to work.  His lower thoracic spine back pain also significant impairs his ability to work.         HEENT    Headache [  ]  Dizziness [  ]  Blurred vision [  ] Glaucoma  [  ]                          Nosebleeds [  ] Painful or loose teeth [  ]        Cardiac :  Chest pain/ pressure [  ]  Resting SOB [  ] exertional SOB [  ]                        Orthopnea [  ]  Pedal edema  [  ]  Palpitations [yes] Syncope/presyncope [ ]                         Paroxysmal nocturnal dyspnea [  ]         Pulmonary : cough [  ]  wheezing [  ]  Hemoptysis [  ] Sputum [  ] Snoring [  ]                              Pneumothorax [  ]  Sleep apnea [  ]        GI : Vomiting [  ]  Dysphagia [  ]  Melena  [  ]  Abdominal pain [  ] BRBPR [  ]              Heart burn [  ]  Constipation [   ] Diarrhea  [  ] Colonoscopy [   ]        GU : Hematuria [  ]  Dysuria [  ]  Nocturia [  ] UTI's [  ]        Vascular : Claudication [  ]  Rest pain [  ]  DVT [  ] Vein stripping [  ] leg ulcers [  ]                          TIA [  ] Stroke [  ]  Varicose veins [  ]        NEURO :  Headaches  [  ] Seizures [  ] Vision changes [  ] Paresthesias [  ]                                               Musculoskeletal :  Arthritis [  ] Gout  [  ]  Back pain [yes]  Joint pain [  ]        Skin :  Rash [  ]  Melanoma [  ] Sores [  ]        Heme : Bleeding problems [  ]Clotting Disorders [  ] Anemia [  ]Blood Transfusion [ ]         Endocrine : Diabetes [  ] Heat or Cold intolerance [  ] Polyuria [  ]excessive thirst [ ]         Psych : Depression [  ]  Anxiety [  ]  Psych hospitalizations [  ] Memory change [  ]                                                                            BP 131/89   Pulse 75   Temp (!) 97.5 F (36.4 C) (Temporal)   Resp 18   Ht 5' 10.5" (1.791 m)   Wt 88.5 kg   SpO2 98%   BMI 27.58 kg/m  Physical Exam       Exam    General- alert and comfortable    Neck- no JVD, no cervical adenopathy palpable, no carotid bruit   Lungs- clear without rales, wheezes.  Well-healed right thoracotomy incision. Instability/dislocation of the xiphoid process with palpation and waistband maneuver.  Xiphoid tender to touch.   Cor- regular rate and rhythm, no murmur , gallop   Abdomen- soft, non-tender   Extremities - warm, non-tender, minimal edema   Neuro- oriented, appropriate, no focal weakness   Diagnostic Tests: CT scan images reviewed as noted above  Impression: Chest wall pain following right thoracotomy 2008 for exposure of the thoracic spine and spinal reconstruction.  Most of this is neuropathic pain and I have advised the patient increase his gabapentin to 300 mg 3 times daily.  The pain center to the xiphoid is probably related to a dislocation of the  cartilage in this should be improved with xiphoid excision.  This procedure will be set up as an outpatient at [facility] in the next week.  Plan: Return for outpatient surgery-removal of xiphoid process.  Increase gabapentin to 3 times daily for neuropathic postthoracotomy pain on the right side.  I discussed the procedure of xiphoid excision using local anesthesia and conscious sedation, the risk of infection and the expected recovery.  He understand that it is not a 100% guarantee that this will improve his neuropathic pain but believe it is likely the  procedure will help the patient.   Len Childs, MD [facility] [phone]  Pre Procedure note for inpatients:   Samad Thon has been scheduled for Procedure(s): EXCISION OF XIPHOID (N/A) today. The various methods of treatment have been discussed with the patient. After consideration of the risks, benefits and treatment options the patient has consented to the planned procedure.   The patient has been seen and labs reviewed. There are no changes in the patient's condition to prevent proceeding with the planned procedure today.  Recent labs:  Lab Results  Component Value Date   WBC 8.4 01/21/2020   HGB 15.9 01/21/2020   HCT 46.9 01/21/2020   PLT 266 01/21/2020   GLUCOSE 100 (H) 01/21/2020   CHOL 139 09/17/2019   TRIG 99 09/17/2019  HDL 48 09/17/2019   LDLCALC 73 09/17/2019   ALT 31 01/21/2020   AST 21 01/21/2020   NA 138 01/21/2020   K 3.6 01/21/2020   CL 103 01/21/2020   CREATININE 1.04 01/21/2020   BUN 14 01/21/2020   CO2 24 01/21/2020   INR 1.1 01/21/2020    Ivin Poot III, MD 01/22/2020 8:00 AM

## 2020-01-22 NOTE — Transfer of Care (Signed)
Immediate Anesthesia Transfer of Care Note  [name]  Procedure(s) Performed: EXCISION OF XIPHOID (N/A )  Patient Location: PACU  Anesthesia Type:General  Level of Consciousness: sedated  Airway & Oxygen Therapy: Patient Spontanous Breathing and Patient connected to face mask oxygen  Post-op Assessment: Report given to RN and Post -op Vital signs reviewed and stable  Post vital signs: Reviewed and stable  Last Vitals:  Vitals Value Taken Time  BP 110/80 01/22/20 0934  Temp 36.2 C 01/22/20 0934  Pulse 58 01/22/20 0941  Resp 12 01/22/20 0941  SpO2 98 % 01/22/20 0941  Vitals shown include unvalidated device data.  Last Pain:  Vitals:   01/22/20 0934  TempSrc:   PainSc: Asleep      Patients Stated Pain Goal: 2 (53/29/92 4268)  Complications: No complications documented.

## 2020-01-22 NOTE — Anesthesia Postprocedure Evaluation (Signed)
Anesthesia Post Note  [name]  Procedure(s) Performed: EXCISION OF XIPHOID (N/A )     Patient location during evaluation: PACU Anesthesia Type: General Level of consciousness: sedated and patient cooperative Pain management: pain level controlled Vital Signs Assessment: post-procedure vital signs reviewed and stable Respiratory status: spontaneous breathing Cardiovascular status: stable Anesthetic complications: no   No complications documented.  Last Vitals:  Vitals:   01/22/20 1005 01/22/20 1020  BP: (!) 128/96 (!) 123/93  Pulse: 65 67  Resp: 14 14  Temp:  (!) 36.3 C  SpO2: 95% 97%    Last Pain:  Vitals:   01/22/20 1020  TempSrc:   PainSc: [place]

## 2020-01-22 NOTE — Brief Op Note (Signed)
01/22/2020  9:30 AM  PATIENT:  Kevin Jackson  49 y.o. male  PRE-OPERATIVE DIAGNOSIS:  xyphodynia  POST-OPERATIVE DIAGNOSIS:  xyphodynia  PROCEDURE:  Procedure(s): EXCISION OF XIPHOID (N/A)  SURGEON:  Surgeon(s) and Role:    Ivin Poot, MD - Primary  PHYSICIAN ASSISTANT:   ASSISTANTS: none   ANESTHESIA:   local and general  EBL:  10 mL   BLOOD ADMINISTERED:none  DRAINS: none   LOCAL MEDICATIONS USED:  MARCAINE    and Amount: 10 ml  SPECIMEN:  Excision  DISPOSITION OF SPECIMEN:  PATHOLOGY  COUNTS:  YES  TOURNIQUET:  * No tourniquets in log *  DICTATION: .Dragon Dictation  PLAN OF CARE: Discharge to home after PACU  PATIENT DISPOSITION:  PACU - hemodynamically stable.   Delay start of Pharmacological VTE agent (>24hrs) due to surgical blood loss or risk of bleeding: yes

## 2020-01-22 NOTE — Op Note (Signed)
NAME: Kevin Jackson, Kevin Jackson MEDICAL RECORD [id number] ACCOUNT [id number] DATE OF BIRTH:1970/11/25 FACILITY: [facility] LOCATION: [facility] PHYSICIAN:Pleasant Britz VAN TRIGT III, MD  OPERATIVE REPORT  DATE OF PROCEDURE:  01/22/2020  OPERATION:  Excision of xiphoid process.  PREOPERATIVE DIAGNOSIS:  Painful xiphoid process with associated popping sensation and probable dislocation.  POSTOPERATIVE DIAGNOSIS:  Painful xiphoid process with associated popping sensation and probable dislocation.  SURGEON:  Ivin Poot, MD  ANESTHESIA:  General with local 0.5% Marcaine.  DESCRIPTION OF PROCEDURE:  The patient was brought from preoperative holding where the patient was examined, informed consent was documented, and the expectations and benefits of the surgery as well as the risks were reviewed again with the patient.  He was placed supine on the operating table and general anesthesia was induced.  The chest and upper abdomen was prepped and draped as a sterile field.  A proper time-out was performed.  Marcaine 0.5% was infiltrated into the lower chest and upper  abdomen based on the xiphoid.  A small 2.5 to 3 cm incision was made based on the xiphoid.  The fascia was incised and the xiphoid was separated from the soft tissue.  It was totally calcified and there was some mobility at the base.  The xiphoid was  excised from the sternum and further debridement to remove the entirety of the xiphoid was completed using the rongeur.  Hemostasis was achieved.  The wound was irrigated.  The fascia, muscle layer was closed with interrupted #1 Vicryl.  The subcutaneous  layer was closed with a running Vicryl and the skin was closed with a subcuticular.  Sterile dressings were applied.  The patient was reversed from anesthesia and returned to recovery room in stable condition.  VN/NUANCE  D:01/22/2020 T:01/22/2020 JOB:[id number]

## 2020-01-23 ENCOUNTER — Encounter (HOSPITAL_COMMUNITY): Payer: Self-pay | Admitting: Cardiothoracic Surgery

## 2020-01-23 LAB — SURGICAL PATHOLOGY

## 2020-01-26 ENCOUNTER — Encounter: Payer: Self-pay | Admitting: Cardiothoracic Surgery

## 2020-01-28 ENCOUNTER — Other Ambulatory Visit: Payer: Self-pay

## 2020-01-29 ENCOUNTER — Other Ambulatory Visit: Payer: Self-pay

## 2020-01-29 ENCOUNTER — Encounter: Payer: Self-pay | Admitting: Cardiothoracic Surgery

## 2020-01-29 ENCOUNTER — Ambulatory Visit (INDEPENDENT_AMBULATORY_CARE_PROVIDER_SITE_OTHER): Payer: Self-pay | Admitting: Cardiothoracic Surgery

## 2020-01-29 DIAGNOSIS — Z4889 Encounter for other specified surgical aftercare: Secondary | ICD-10-CM | POA: Insufficient documentation

## 2020-01-29 NOTE — Progress Notes (Signed)
PCP is Lillard Anes, MD Referring Provider is Phineas Inches, MD  Chief Complaint  Patient presents with  . Routine Post Op    1 week f/u s/p Excision of xiphoid process 01/22/20    HPI: Patient returns for wound check after removal of the angulated xiphoid process which was the focus for chronic chest wall pain. Pathology report came back normal bone and cartilage.  There is no evidence of fracture or inflammation at the time of surgery.  The incision now is healing without drainage.  It remains sore.  The patient may go back to normal activities and leave the Dermabond medical adhesive in place until it wears off.  He will return in 4 weeks for final wound check.  Past Medical History:  Diagnosis Date  . Anxiety   . BACK PAIN, LUMBAR 08/06/2009   Qualifier: Diagnosis of  By: Regino Schultze CMA (AAMA), Apolonio Schneiders    . CHEST PAIN 05/06/2010   Qualifier: Diagnosis of  By: Sherren Mocha MD, Jory Ee   . Chronic chest wall pain 08/28/2015  . Depression   . GERD (gastroesophageal reflux disease)   . Hypertension   . Kidney stones   . Mixed hyperlipidemia 09/17/2019  . Neuralgia   . Numerous moles 01/19/2012  . 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 Supraventricular tachycardia ([place]) 09/17/2019    Past Surgical History:  Procedure Laterality Date  . APPENDECTOMY    . BACK SURGERY    . HERNIA REPAIR    . LAPAROSCOPIC GASTROTOMY W/ REPAIR OF ULCER    . MASS EXCISION N/A 01/22/2020   Procedure: EXCISION OF XIPHOID;  Surgeon: Ivin Poot, MD;  Location: [facility];  Service: Thoracic;  Laterality: N/A;  . right thoracotomy for exposure for the C7-T8 diskectomy  02/06/2007   Dr Arlyce Dice  . TONSILLECTOMY      Family History  Problem Relation Age of Onset  . Hypertension Mother   . Asthma Mother   . Cancer Paternal Uncle        intestinal  . Healthy Sister   . Healthy Brother   . Scoliosis Daughter   . Healthy Son   . Other Brother        tumors    Social History Social History   Tobacco  Use  . Smoking status: Former Smoker    Types: Cigarettes    Quit date: 05/23/1997    Years since quitting: 22.7  . Smokeless tobacco: Current User    Types: Chew  Vaping Use  . Vaping Use: Never used  Substance Use Topics  . Alcohol use: Yes    Alcohol/week: 6.0 standard drinks    Types: 6 Cans of beer per week    Comment: Once a month  . Drug use: Not Currently    Current Outpatient Medications  Medication Sig Dispense Refill  . atorvastatin (LIPITOR) 40 MG tablet Take 1 tablet (40 mg total) by mouth daily. 90 tablet 2  . citalopram (CELEXA) 20 MG tablet Take 1 tablet (20 mg total) by mouth daily. 30 tablet 3  . diclofenac Sodium (VOLTAREN) 1 % GEL Apply 2 g topically 4 (four) times daily. (Patient taking differently: Apply 2 g topically 4 (four) times daily as needed (arthritis pain.). ) 350 g 2  . diltiazem (CARDIZEM) 120 MG tablet Take 1 tablet (120 mg total) by mouth daily. (Patient taking differently: Take 120 mg by mouth every evening. ) 90 tablet 3  . gabapentin (NEURONTIN) 300 MG capsule TAKE 1 CAPSULE BY  MOUTH AT BEDTIME FOR 5 DAYS THEN 1 TWICE DAILY FOR 25 DAYS (Patient taking differently: Take 300 mg by mouth 3 (three) times daily. ) 55 capsule 0  . oxyCODONE-acetaminophen (PERCOCET) 10-325 MG tablet Take 1 tablet by mouth every 6 (six) hours as needed for pain. 20 tablet 0   No current facility-administered medications for this visit.    No Known Allergies  Review of Systems   Wound sore but no drainage or abnormal swelling  BP 130/88   Pulse 79   Temp 97.8 F (36.6 C) (Skin)   Resp 20   Ht 5' 10.5" (1.791 m)   Wt 196 lb (88.9 kg)   SpO2 96% Comment: RA  BMI 27.73 kg/m  Physical Exam      Exam    General- alert and comfortable.  Surgical incision healing appropriately.    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  Pathology normal.  Incision healing appropriately.  Plan: Patient may return to normal activities and return for final wound check in 4 weeks.   Len Childs, MD [facility] [phone]

## 2020-02-05 ENCOUNTER — Encounter: Payer: Self-pay | Admitting: Specialist

## 2020-02-05 ENCOUNTER — Ambulatory Visit: Payer: 59 | Admitting: Specialist

## 2020-02-05 VITALS — BP 124/85 | HR 89 | Ht 70.5 in | Wt 196.0 lb

## 2020-02-05 DIAGNOSIS — M5134 Other intervertebral disc degeneration, thoracic region: Secondary | ICD-10-CM | POA: Diagnosis not present

## 2020-02-05 DIAGNOSIS — M546 Pain in thoracic spine: Secondary | ICD-10-CM | POA: Diagnosis not present

## 2020-02-05 DIAGNOSIS — G8929 Other chronic pain: Secondary | ICD-10-CM

## 2020-02-05 MED ORDER — GABAPENTIN 300 MG PO CAPS
300.0000 mg | ORAL_CAPSULE | Freq: Three times a day (TID) | ORAL | 4 refills | Status: DC
Start: 2020-02-05 — End: 2020-06-16

## 2020-02-05 NOTE — Progress Notes (Signed)
Office Visit Note   [name]           Date of Birth: 11/04/1970           MRN: [id number] Visit Date: 02/05/2020              Requested by: Lillard Anes, MD [address] [place],  [place] [address] PCP: Lillard Anes, MD   Assessment & Plan: Visit Diagnoses:  1. Thoracic degenerative disc disease   2. Other intervertebral disc degeneration, thoracic region   3. Chronic thoracic back pain, unspecified back pain laterality     Plan: Avoid frequent bending and stooping  No lifting greater than 10 lbs. May use ice or moist heat for pain. Weight loss is of benefit. Best medication for lumbar disc disease is arthritis medications like motrin, celebrex and naprosyn. Exercise is important to improve your indurance and does allow people to function better inspite of back pain.    Follow-Up Instructions: No follow-ups on file.   Orders:  No orders of the defined types were placed in this encounter.  No orders of the defined types were placed in this encounter.     Procedures: No procedures performed   Clinical Data: No additional findings.   Subjective: Chief Complaint  Patient presents with  . Middle Back - Follow-up    49 year old male with history of multiple problems of his back right lower back pain with some radiation into the right leg. He is taking gabapentin. Was released by Dr. Lorin Mercy after being  Seen and was release, "good to go". He reports that he is having persistent back, right buttock and right leg. Feels like the whole right buttock and lumbosacral area is painful. Was released to go to full duties by Dr. Lorin Mercy. Dr. Nils Pyle had him increase the dose of gabapentin to TID.  He underwent a xyphoidectomy. There is pain associated with the upper thoracic area that is a  4-5 out of 10 but increases to 8-9 with work. 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 Was  returned to regular activities post xyphoidectomy. He has improvement in the discomfort with lying on the right side and with palpation along the right lower rib margin.Marland Kitchen He has persistent right back pain with midline discomfort at the level of the lower portion of the shoulder blades.    Review of Systems  Constitutional: Negative.   HENT: Negative.   Eyes: Negative.   Respiratory: Negative.   Cardiovascular: Negative.   Gastrointestinal: Negative.   Endocrine: Negative.   Genitourinary: Negative.   Musculoskeletal: Negative.   Skin: Negative.   Allergic/Immunologic: Negative.   Neurological: Negative.   Hematological: Negative.   Psychiatric/Behavioral: Negative.      Objective: Vital Signs: BP 124/85 (BP Location: Left Arm, Patient Position: Sitting)   Pulse 89   Ht 5' 10.5" (1.791 m)   Wt 196 lb (88.9 kg)   BMI 27.73 kg/m   Physical Exam Constitutional:      Appearance: He is well-developed.  HENT:     Head: Normocephalic and atraumatic.  Eyes:     Pupils: Pupils are equal, round, and reactive to light.  Pulmonary:     Effort: Pulmonary effort is normal.     Breath sounds: Normal breath sounds.  Abdominal:     General: Bowel sounds are normal.     Palpations: Abdomen is soft.  Musculoskeletal:  Cervical back: Normal range of motion and neck supple.     Lumbar back: Negative right straight leg raise test and negative left straight leg raise test.  Skin:    General: Skin is warm and dry.  Neurological:     Mental Status: He is alert and oriented to person, place, and time.  Psychiatric:        Behavior: Behavior normal.        Thought Content: Thought content normal.        Judgment: Judgment normal.     Back Exam   Tenderness  The patient is experiencing tenderness in the thoracic.  Range of Motion  Extension: abnormal  Flexion: abnormal  Lateral bend right: abnormal  Lateral bend left: abnormal  Rotation right: abnormal  Rotation left:  abnormal   Muscle Strength  Right Quadriceps:  5/5  Left Quadriceps:  5/5  Right Hamstrings:  5/5  Left Hamstrings:  5/5   Tests  Straight leg raise right: negative Straight leg raise left: negative  Reflexes  Patellar: 2/4 Achilles: 2/4 Babinski's sign: normal   Other  Toe walk: normal Heel walk: normal Sensation: normal Gait: normal  Erythema: no back redness Scars: absent      Specialty Comments:  No specialty comments available.  Imaging: No results found.   PMFS History: Patient Active Problem List   Diagnosis Date Noted  . Encounter for post surgical wound check 01/29/2020  . 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  . Mixed hyperlipidemia 09/17/2019  . Supraventricular tachycardia ([place]) 09/17/2019  . Chronic chest wall pain 08/28/2015  . 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 Numerous moles 01/19/2012  . Hypertension 03/17/2011  . CHEST PAIN 05/06/2010  . BACK PAIN, LUMBAR 08/06/2009   Past Medical History:  Diagnosis Date  . Anxiety   . BACK PAIN, LUMBAR 08/06/2009   Qualifier: Diagnosis of  By: Regino Schultze CMA (AAMA), Apolonio Schneiders    . CHEST PAIN 05/06/2010   Qualifier: Diagnosis of  By: Sherren Mocha MD, Jory Ee   . Chronic chest wall pain 08/28/2015  . Depression   . GERD (gastroesophageal reflux disease)   . Hypertension   . Kidney stones   . Mixed hyperlipidemia 09/17/2019  . Neuralgia   . Numerous moles 01/19/2012  . 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 Supraventricular tachycardia ([place]) 09/17/2019    Family History  Problem Relation Age of Onset  . Hypertension Mother   . Asthma Mother   . Cancer Paternal Uncle        intestinal  . Healthy Sister   . Healthy Brother   . Scoliosis Daughter   . Healthy Son   . Other Brother        tumors    Past Surgical History:  Procedure Laterality Date  . APPENDECTOMY    . BACK SURGERY    . HERNIA REPAIR    . LAPAROSCOPIC GASTROTOMY W/ REPAIR OF ULCER    . MASS EXCISION  N/A 01/22/2020   Procedure: EXCISION OF XIPHOID;  Surgeon: Ivin Poot, MD;  Location[facility];  Service: Thoracic;  Laterality: N/A;  . right thoracotomy for exposure for the C7-T8 diskectomy  02/06/2007   Dr Arlyce Dice  . TONSILLECTOMY     Social History   Occupational History    Employer: [employer]  Tobacco Use  . Smoking status: Former Smoker    Types: Cigarettes    Quit date: 05/23/1997    Years since quitting: 22.7  . Smokeless  tobacco: Current User    Types: Chew  Vaping Use  . Vaping Use: Never used  Substance and Sexual Activity  . Alcohol use: Yes    Alcohol/week: 6.0 standard drinks    Types: 6 Cans of beer per week    Comment: Once a month  . Drug use: Not Currently  . Sexual activity: Yes

## 2020-02-13 ENCOUNTER — Encounter: Payer: Self-pay | Admitting: Orthopaedic Surgery

## 2020-02-14 ENCOUNTER — Encounter: Payer: Self-pay | Admitting: Orthopaedic Surgery

## 2020-02-14 ENCOUNTER — Ambulatory Visit (INDEPENDENT_AMBULATORY_CARE_PROVIDER_SITE_OTHER): Payer: Worker's Compensation | Admitting: Orthopaedic Surgery

## 2020-02-14 ENCOUNTER — Ambulatory Visit (HOSPITAL_BASED_OUTPATIENT_CLINIC_OR_DEPARTMENT_OTHER): Payer: 59 | Attending: Cardiology | Admitting: Cardiovascular Disease

## 2020-02-14 VITALS — Ht 70.5 in | Wt 195.0 lb

## 2020-02-14 DIAGNOSIS — M545 Low back pain, unspecified: Secondary | ICD-10-CM

## 2020-02-14 DIAGNOSIS — G8929 Other chronic pain: Secondary | ICD-10-CM | POA: Diagnosis not present

## 2020-02-14 MED ORDER — OXYCODONE-ACETAMINOPHEN 5-325 MG PO TABS: 1.0000 | ORAL_TABLET | ORAL | 0 refills | Status: DC | PRN

## 2020-02-14 MED ORDER — PREDNISONE 10 MG (21) PO TBPK: ORAL_TABLET | ORAL | 0 refills | Status: DC

## 2020-02-17 NOTE — Progress Notes (Signed)
Office Visit Note   [name]           Date of Birth: Feb 25, 1971           MRN: [id number] Visit Date: 02/14/2020              Requested by: Lillard Anes, MD [address] [place],  [place] [address] PCP: Lillard Anes, MD   Assessment & Plan: Visit Diagnoses:  1. Chronic right-sided low back pain, unspecified whether sciatica present     Plan: Patient had previous foraminal disc noted at L3-4 on the right.  He now is having excruciating pain cannot stand has trouble walking not able to work.  Work slip given.  We will proceed with MRI scan to rule out enlargement of the right L3-4 HNP with compression.  Office follow-up after scan for review.  Prednisone Dosepak prescribed.  Oxycodone 5 mg 1-2 every 4-6 hours prescribed for pain.  Follow-Up Instructions: No follow-ups on file.   Orders:  Orders Placed This Encounter  Procedures  . MR Lumbar Spine w/o contrast   Meds ordered this encounter  Medications  . predniSONE (STERAPRED UNI-PAK 21 TAB) 10 MG (21) TBPK tablet    Sig: Take with food 6 tablets first day then 5,4,3,2,1 each day one less tablet.    Dispense:  21 tablet    Refill:  0  . oxyCODONE-acetaminophen (PERCOCET/ROXICET) 5-325 MG tablet    Sig: Take 1-2 tablets by mouth every 4 (four) hours as needed for severe pain.    Dispense:  30 tablet    Refill:  0      Procedures: No procedures performed   Clinical Data: No additional findings.   Subjective: Chief Complaint  Patient presents with  . Lower Back - Pain    HPI 49 year old male returns he states he has had significant increase in his pain levels lumbar spine.  He has been giving him some aching in the last 2 weeks but yesterday he was lifting some objects at work suddenly got more painful.  When he tried to get up he states the pain was excruciating he has severe pain trying to lift his leg on the right side.  Pain is in the right lower back radiates  to the groin and the posterior leg stops at the knee.  Previous MRI showed right L3-4 H&P right side which was small and in the foramina.  He did have moderate foraminal stenosis.  He states his pain is excruciating he cannot stand and he is having trouble standing sitting moving.  He is here with his wife.  Review of Systems all other systems are negative as they pertain to HPI.   Objective: Vital Signs: Ht 5' 10.5" (1.791 m)   Wt 195 lb (88.5 kg)   BMI 27.58 kg/m   Physical Exam Constitutional:      Appearance: He is well-developed.     Comments: Patient is in significant pain.  He has trouble getting from sitting to standing, trouble walking.  HENT:     Head: Normocephalic and atraumatic.  Eyes:     Pupils: Pupils are equal, round, and reactive to light.  Neck:     Thyroid: No thyromegaly.     Trachea: No tracheal deviation.  Cardiovascular:     Rate and Rhythm: Normal rate.  Pulmonary:     Effort: Pulmonary effort is normal.     Breath sounds: No wheezing.  Abdominal:  General: Bowel sounds are normal.     Palpations: Abdomen is soft.  Skin:    General: Skin is warm and dry.     Capillary Refill: Capillary refill takes less than 2 seconds.  Neurological:     Mental Status: He is alert and oriented to person, place, and time.  Psychiatric:        Behavior: Behavior normal.        Thought Content: Thought content normal.        Judgment: Judgment normal.     Ortho Exam patient has severe pain with straight leg raising on the right at 60 degrees.  Knee and ankle jerk are intact.  Normal sensation anterior thigh.  Ankle jerk is intact.  Specialty Comments:  No specialty comments available.  Imaging: No results found.   PMFS History: Patient Active Problem List   Diagnosis Date Noted  . Encounter for post surgical wound check 01/29/2020  . 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  . Mixed hyperlipidemia 09/17/2019  .  Supraventricular tachycardia ([place]) 09/17/2019  . Chronic chest wall pain 08/28/2015  . 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 Numerous moles 01/19/2012  . Hypertension 03/17/2011  . CHEST PAIN 05/06/2010  . BACK PAIN, LUMBAR 08/06/2009   Past Medical History:  Diagnosis Date  . Anxiety   . BACK PAIN, LUMBAR 08/06/2009   Qualifier: Diagnosis of  By: Regino Schultze CMA (AAMA), Apolonio Schneiders    . CHEST PAIN 05/06/2010   Qualifier: Diagnosis of  By: Sherren Mocha MD, Jory Ee   . Chronic chest wall pain 08/28/2015  . Depression   . GERD (gastroesophageal reflux disease)   . Hypertension   . Kidney stones   . Mixed hyperlipidemia 09/17/2019  . Neuralgia   . Numerous moles 01/19/2012  . 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 Supraventricular tachycardia ([place]) 09/17/2019    Family History  Problem Relation Age of Onset  . Hypertension Mother   . Asthma Mother   . Cancer Paternal Uncle        intestinal  . Healthy Sister   . Healthy Brother   . Scoliosis Daughter   . Healthy Son   . Other Brother        tumors    Past Surgical History:  Procedure Laterality Date  . APPENDECTOMY    . BACK SURGERY    . HERNIA REPAIR    . LAPAROSCOPIC GASTROTOMY W/ REPAIR OF ULCER    . MASS EXCISION N/A 01/22/2020   Procedure: EXCISION OF XIPHOID;  Surgeon: Ivin Poot, MD;  Location: [facility] OR;  Service: Thoracic;  Laterality: N/A;  . right thoracotomy for exposure for the C7-T8 diskectomy  02/06/2007   Dr Arlyce Dice  . TONSILLECTOMY     Social History   Occupational History    Employer: [employer]  Tobacco Use  . Smoking status: Former Smoker    Types: Cigarettes    Quit date: 05/23/1997    Years since quitting: 22.7  . Smokeless tobacco: Current User    Types: Chew  Vaping Use  . Vaping Use: Never used  Substance and Sexual Activity  . Alcohol use: Yes    Alcohol/week: 6.0 standard drinks    Types: 6 Cans of beer per week    Comment: Once a month  . Drug use: Not Currently  . Sexual activity: Yes

## 2020-02-18 ENCOUNTER — Ambulatory Visit
Admission: RE | Admit: 2020-02-18 | Discharge: 2020-02-18 | Disposition: A | Discharge status: Home / Self Care | Payer: 59 | Source: Ambulatory Visit | Attending: Specialist | Admitting: Specialist

## 2020-02-18 DIAGNOSIS — M5134 Other intervertebral disc degeneration, thoracic region: Secondary | ICD-10-CM

## 2020-02-18 MED ORDER — GADOBENATE DIMEGLUMINE 529 MG/ML IV SOLN
18.0000 mL | Freq: Once | INTRAVENOUS | Status: AC | PRN
  Administered 2020-02-18: 18 mL via INTRAVENOUS

## 2020-02-20 ENCOUNTER — Telehealth: Payer: Self-pay | Admitting: Orthopaedic Surgery

## 2020-02-20 NOTE — Telephone Encounter (Signed)
Received vm from [place] w/ [facility] checking status of request. IC,lmvm [phone] advised request was processed by [facility] on 9/20. Advised to contact [employer] directly and left [facility] ph number.

## 2020-02-26 ENCOUNTER — Encounter: Payer: Self-pay | Admitting: Orthopaedic Surgery

## 2020-02-26 ENCOUNTER — Ambulatory Visit: Payer: Self-pay | Admitting: Cardiothoracic Surgery

## 2020-02-27 ENCOUNTER — Telehealth: Payer: Self-pay | Admitting: Orthopaedic Surgery

## 2020-02-27 NOTE — Telephone Encounter (Signed)
Called patient left message to return call to schedule an appointment with Dr. Yates for MRI review ?

## 2020-03-03 ENCOUNTER — Ambulatory Visit: Payer: 59 | Admitting: Cardiology

## 2020-03-04 ENCOUNTER — Encounter: Payer: Self-pay | Admitting: Orthopaedic Surgery

## 2020-03-04 ENCOUNTER — Ambulatory Visit: Payer: 59 | Admitting: Surgery

## 2020-03-04 ENCOUNTER — Ambulatory Visit (INDEPENDENT_AMBULATORY_CARE_PROVIDER_SITE_OTHER): Payer: Worker's Compensation | Admitting: Orthopaedic Surgery

## 2020-03-04 VITALS — BP 143/91 | HR 91 | Ht 70.5 in | Wt 195.0 lb

## 2020-03-04 DIAGNOSIS — M5126 Other intervertebral disc displacement, lumbar region: Secondary | ICD-10-CM | POA: Diagnosis not present

## 2020-03-04 MED ORDER — OXYCODONE-ACETAMINOPHEN 5-325 MG PO TABS
1.0000 | ORAL_TABLET | Freq: Two times a day (BID) | ORAL | 0 refills | Status: DC | PRN
Start: 2020-03-04 — End: 2020-04-07

## 2020-03-04 NOTE — Progress Notes (Signed)
Office Visit Note   [name]           Date of Birth: 10/21/1970           MRN: [id number] Visit Date: 03/04/2020              Requested by: Lillard Anes, MD [address] [place],  [place] [address] PCP: Lillard Anes, MD   Assessment & Plan: Visit Diagnoses:  1. Protrusion of lumbar intervertebral disc     Plan: MRI disc and scan was reviewed and discussed.  He has short pedicles which contribute to his canal narrowing.  Patient has some central disc protrusion at L3-4 and extension out the right neural foramina with right lateral recess stenosis and right L4 nerve root compression.  He can get his second opinion and then call and let us know how would like to proceed.  Pathophysiology discussed and treatment options discussed.  Follow-Up Instructions: No follow-ups on file.   Orders:  No orders of the defined types were placed in this encounter.  Meds ordered this encounter  Medications  . oxyCODONE-acetaminophen (PERCOCET/ROXICET) 5-325 MG tablet    Sig: Take 1-2 tablets by mouth every 12 (twelve) hours as needed for severe pain.    Dispense:  40 tablet    Refill:  0    Workers comp      Procedures: No procedures performed   Clinical Data: No additional findings.   Subjective: Chief Complaint  Patient presents with  . Lower Back - Pain, Follow-up    MRI Lumbar Review    HPI 49 year old male returns with continued on going severe back pain and right leg pain.  Patient states he has a second opinion coming up tomorrow and MRI scan that was obtained showed right paracentral disc protrusion right greater than left foraminal lateral recess stenosis with L3 and L4 nerve root compression.  He also has some central disc protrusion and on the right at L5-S1.  Patient states back is Worker's Comp. related.  He underwent a xiphoidectomy in the past.  Has been treated with gabapentin.  Date of injury was 07/26/2019.  Review  of Systems all other systems updated unchanged from 02/05/2020 office note.   Objective: Vital Signs: BP (!) 143/91   Pulse 91   Ht 5' 10.5" (1.791 m)   Wt 195 lb (88.5 kg)   BMI 27.58 kg/m   Physical Exam Constitutional:      Appearance: He is well-developed.  HENT:     Head: Normocephalic and atraumatic.  Eyes:     Pupils: Pupils are equal, round, and reactive to light.  Neck:     Thyroid: No thyromegaly.     Trachea: No tracheal deviation.  Cardiovascular:     Rate and Rhythm: Normal rate.  Pulmonary:     Effort: Pulmonary effort is normal.     Breath sounds: No wheezing.  Abdominal:     General: Bowel sounds are normal.     Palpations: Abdomen is soft.  Skin:    General: Skin is warm and dry.     Capillary Refill: Capillary refill takes less than 2 seconds.  Neurological:     Mental Status: He is alert and oriented to person, place, and time.  Psychiatric:        Behavior: Behavior normal.        Thought Content: Thought content normal.        Judgment: Judgment normal.  Ortho Exam patient still having trouble getting from sitting to standing no some problems walking due to right leg pain.  Posterior leg raising on the right at 60 degrees.  Knee and ankle jerk are intact.  Gastrocsoleus resistive testing EHL is normal.  Specialty Comments:  No specialty comments available.  Imaging: IMPRESSION:  1. Short pedicles contribute to mild proximal foraminal and lateral recess narrowing. This is exacerbated by hypertrophic facets and foraminal disc protrusions as above.  2. Focally prominent dorsal epidural fat contributes to thecal sac narrowing at L3-L4. Right paracentral disc protrusion causes right greater than left foraminal lateral recess stenosis, with L3 and L4 root compressions respectively.  3. Bilateral mild foraminal stenosis L4-L5.  4. Central disc protrusion with right lateral recess (S1 roots) and foraminal stenosis (L5 roots) at L5-S1.    Electronically Signed by: Ed Blalock Narrative Performed by [phone] TECHNIQUE: - . Sagittal T1, T2 and STIR images. Axial T1 and T2 images.   COMPARISON: None   INDICATION: Low back pain, unspecified    Exam date/time: 02/27/2020 9:01 AM     PMFS History: Patient Active Problem List   Diagnosis Date Noted  . Encounter for post surgical wound check 01/29/2020  . 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  . Mixed hyperlipidemia 09/17/2019  . Supraventricular tachycardia ([place]) 09/17/2019  . Chronic chest wall pain 08/28/2015  . 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 Numerous moles 01/19/2012  . Hypertension 03/17/2011  . CHEST PAIN 05/06/2010  . BACK PAIN, LUMBAR 08/06/2009   Past Medical History:  Diagnosis Date  . Anxiety   . BACK PAIN, LUMBAR 08/06/2009   Qualifier: Diagnosis of  By: Regino Schultze CMA (AAMA), Apolonio Schneiders    . CHEST PAIN 05/06/2010   Qualifier: Diagnosis of  By: Sherren Mocha MD, Jory Ee   . Chronic chest wall pain 08/28/2015  . Depression   . GERD (gastroesophageal reflux disease)   . Hypertension   . Kidney stones   . Mixed hyperlipidemia 09/17/2019  . Neuralgia   . Numerous moles 01/19/2012  . 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 Supraventricular tachycardia ([place]) 09/17/2019    Family History  Problem Relation Age of Onset  . Hypertension Mother   . Asthma Mother   . Cancer Paternal Uncle        intestinal  . Healthy Sister   . Healthy Brother   . Scoliosis Daughter   . Healthy Son   . Other Brother        tumors    Past Surgical History:  Procedure Laterality Date  . APPENDECTOMY    . BACK SURGERY    . HERNIA REPAIR    . LAPAROSCOPIC GASTROTOMY W/ REPAIR OF ULCER    . MASS EXCISION N/A 01/22/2020   Procedure: EXCISION OF XIPHOID;  Surgeon: Ivin Poot, MD;  Location: [facility] OR;  Service: Thoracic;  Laterality: N/A;  . right thoracotomy for exposure for the C7-T8 diskectomy  02/06/2007   Dr Arlyce Dice  . TONSILLECTOMY      Social History   Occupational History    Employer: [employer]  Tobacco Use  . Smoking status: Former Smoker    Types: Cigarettes    Quit date: 05/23/1997    Years since quitting: 22.8  . Smokeless tobacco: Current User    Types: Chew  Vaping Use  . Vaping Use: Never used  Substance and Sexual Activity  . Alcohol use: Yes    Alcohol/week: 6.0 standard drinks    Types:  6 Cans of beer per week    Comment: Once a month  . Drug use: Not Currently  . Sexual activity: Yes

## 2020-03-12 ENCOUNTER — Ambulatory Visit: Payer: 59 | Admitting: Specialist

## 2020-03-12 ENCOUNTER — Encounter: Payer: Self-pay | Admitting: Specialist

## 2020-03-18 ENCOUNTER — Other Ambulatory Visit: Payer: Self-pay

## 2020-03-18 ENCOUNTER — Encounter: Payer: Self-pay | Admitting: Specialist

## 2020-03-18 ENCOUNTER — Ambulatory Visit (INDEPENDENT_AMBULATORY_CARE_PROVIDER_SITE_OTHER): Payer: 59 | Admitting: Specialist

## 2020-03-18 ENCOUNTER — Ambulatory Visit: Payer: 59 | Admitting: Legal Medicine

## 2020-03-18 VITALS — BP 133/85 | HR 89 | Ht 70.5 in | Wt 195.0 lb

## 2020-03-18 DIAGNOSIS — R0781 Pleurodynia: Secondary | ICD-10-CM

## 2020-03-18 DIAGNOSIS — M546 Pain in thoracic spine: Secondary | ICD-10-CM

## 2020-03-18 DIAGNOSIS — R0789 Other chest pain: Secondary | ICD-10-CM | POA: Diagnosis not present

## 2020-03-18 DIAGNOSIS — M5134 Other intervertebral disc degeneration, thoracic region: Secondary | ICD-10-CM

## 2020-03-18 DIAGNOSIS — G8929 Other chronic pain: Secondary | ICD-10-CM

## 2020-03-18 NOTE — Progress Notes (Signed)
Office Visit Note   [name]           Date of Birth: 04/30/1971           MRN: [id number] Visit Date: 03/18/2020              Requested by: Lillard Anes, MD [address] [place],  [place] [address] PCP: Lillard Anes, MD   Assessment & Plan: Visit Diagnoses:  1. Thoracic degenerative disc disease   2. Chronic thoracic back pain, unspecified back pain laterality   3. Xyphoidalgia   4. Rib pain on right side     Plan: The thoracic spine condition is degenerative disc disease and arthrosis changes due to previous disc herniation and disc herniation surgery It is mechanical disc pain and would see improvement with fusion that is localized to that area. There is another lower disc in the thoracic spine that shows  Mild degenerative changes in the front of the disc. The MRI shows no spinal cord deformity or compression and no nerve root compression. My recommendation is to consider surgical fusion of the  T7-8 level after you have had the lumbar condition addressed. With addressing the lumbar condition The upper thoracic pain may improve due to the two back conditions inhibiting the ability to change posture without irritating the other area. You can consider having Both areas fused at the same setting but the upper disc condition is chronic and relates to previous thoracic discectomy.   Follow-Up Instructions: No follow-ups on file.   Orders:  No orders of the defined types were placed in this encounter.  No orders of the defined types were placed in this encounter.     Procedures: No procedures performed   Clinical Data: No additional findings.   Subjective: Chief Complaint  Patient presents with  . Middle Back - Follow-up    MRI Review    49 year old male with history of right back and leg pain with radiation into the right medial thigh anteriorly and the right anteromedial calf. He has received a second  opinion Concerning his pain in the right lower back and has been told he has foramenal narrowing associated with a disc injury. Either discectomy or discectomy and fusion are a  Consideration. He has pain with bending transitioning from sitting to standing and with walking he has pain in the right leg and he reports hurting his foot with walking out the door. His upper back pain is such that he feels a need to extend the back for the upper back he then feels pain into the lower. The results of the MRI of the thoracic spine study indicate that there is DDD T7-8 and at T10-11.    Review of Systems  Constitutional: Negative.   HENT: Negative.   Eyes: Negative.   Respiratory: Negative.   Cardiovascular: Negative.   Gastrointestinal: Negative.   Endocrine: Negative.   Genitourinary: Negative.   Musculoskeletal: Negative.   Skin: Negative.   Allergic/Immunologic: Negative.   Neurological: Negative.   Hematological: Negative.   Psychiatric/Behavioral: Negative.      Objective: Vital Signs: BP 133/85 (BP Location: Left Arm, Patient Position: Sitting)   Pulse 89   Ht 5' 10.5" (1.791 m)   Wt 195 lb (88.5 kg)   BMI 27.58 kg/m   Physical Exam Constitutional:      Appearance: He is well-developed.  HENT:     Head: Normocephalic and atraumatic.  Eyes:  Pupils: Pupils are equal, round, and reactive to light.  Pulmonary:     Effort: Pulmonary effort is normal.     Breath sounds: Normal breath sounds.  Abdominal:     General: Bowel sounds are normal.     Palpations: Abdomen is soft.  Musculoskeletal:     Cervical back: Normal range of motion and neck supple.     Lumbar back: Negative right straight leg raise test and negative left straight leg raise test.  Skin:    General: Skin is warm and dry.  Neurological:     Mental Status: He is alert and oriented to person, place, and time.  Psychiatric:        Behavior: Behavior normal.        Thought Content: Thought content normal.         Judgment: Judgment normal.     Back Exam   Tenderness  The patient is experiencing tenderness in the thoracic and lumbar.  Range of Motion  Extension: abnormal  Flexion: abnormal  Lateral bend right: normal  Lateral bend left: normal  Rotation right: normal  Rotation left: normal   Muscle Strength  Right Quadriceps:  5/5  Left Quadriceps:  5/5  Right Hamstrings:  5/5  Left Hamstrings:  5/5   Tests  Straight leg raise right: negative Straight leg raise left: negative  Other  Toe walk: normal Heel walk: normal Sensation: normal Gait: normal  Erythema: no back redness Scars: absent  Comments:  No long track findings.        Specialty Comments:  No specialty comments available.  Imaging: No results found.   PMFS History: Patient Active Problem List   Diagnosis Date Noted  . Encounter for post surgical wound check 01/29/2020  . 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  . Mixed hyperlipidemia 09/17/2019  . Supraventricular tachycardia ([place]) 09/17/2019  . Chronic chest wall pain 08/28/2015  . 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 Numerous moles 01/19/2012  . Hypertension 03/17/2011  . CHEST PAIN 05/06/2010  . BACK PAIN, LUMBAR 08/06/2009   Past Medical History:  Diagnosis Date  . Anxiety   . BACK PAIN, LUMBAR 08/06/2009   Qualifier: Diagnosis of  By: Regino Schultze CMA (AAMA), Apolonio Schneiders    . CHEST PAIN 05/06/2010   Qualifier: Diagnosis of  By: Sherren Mocha MD, Jory Ee   . Chronic chest wall pain 08/28/2015  . Depression   . GERD (gastroesophageal reflux disease)   . Hypertension   . Kidney stones   . Mixed hyperlipidemia 09/17/2019  . Neuralgia   . Numerous moles 01/19/2012  . 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 Supraventricular tachycardia ([place]) 09/17/2019    Family History  Problem Relation Age of Onset  . Hypertension Mother   . Asthma Mother   . Cancer Paternal Uncle        intestinal  . Healthy Sister   . Healthy  Brother   . Scoliosis Daughter   . Healthy Son   . Other Brother        tumors    Past Surgical History:  Procedure Laterality Date  . APPENDECTOMY    . BACK SURGERY    . HERNIA REPAIR    . LAPAROSCOPIC GASTROTOMY W/ REPAIR OF ULCER    . MASS EXCISION N/A 01/22/2020   Procedure: EXCISION OF XIPHOID;  Surgeon: Ivin Poot, MD;  Location: [facility] OR;  Service: Thoracic;  Laterality: N/A;  . right thoracotomy for exposure for the C7-T8 diskectomy  02/06/2007   Dr Arlyce Dice  . TONSILLECTOMY     Social History   Occupational History    Employer:  [employer]  Tobacco Use  . Smoking status: Former Smoker    Types: Cigarettes    Quit date: 05/23/1997    Years since quitting: 22.8  . Smokeless tobacco: Current User    Types: Chew  Vaping Use  . Vaping Use: Never used  Substance and Sexual Activity  . Alcohol use: Yes    Alcohol/week: 6.0 standard drinks    Types: 6 Cans of beer per week    Comment: Once a month  . Drug use: Not Currently  . Sexual activity: Yes

## 2020-04-07 ENCOUNTER — Other Ambulatory Visit: Payer: Self-pay

## 2020-04-07 ENCOUNTER — Encounter: Payer: Self-pay | Admitting: Family

## 2020-04-07 ENCOUNTER — Telehealth: Payer: Self-pay | Admitting: Cardiology

## 2020-04-07 ENCOUNTER — Ambulatory Visit: Payer: 59 | Admitting: Family

## 2020-04-07 VITALS — BP 130/78 | HR 91 | Temp 98.3°F | Ht 70.5 in | Wt 201.4 lb

## 2020-04-07 DIAGNOSIS — E782 Mixed hyperlipidemia: Secondary | ICD-10-CM

## 2020-04-07 DIAGNOSIS — G8929 Other chronic pain: Secondary | ICD-10-CM | POA: Diagnosis not present

## 2020-04-07 DIAGNOSIS — I471 Supraventricular tachycardia: Secondary | ICD-10-CM | POA: Diagnosis not present

## 2020-04-07 DIAGNOSIS — M545 Low back pain, unspecified: Secondary | ICD-10-CM | POA: Diagnosis not present

## 2020-04-07 MED ORDER — DILTIAZEM HCL ER 180 MG PO CP24: 180.0000 mg | ORAL_CAPSULE | Freq: Every day | ORAL | 0 refills | Status: DC

## 2020-04-07 NOTE — Progress Notes (Signed)
Kevin Jackson is a 49 y.o. male with the following history as recorded in EpicCare:  Patient Active Problem List   Diagnosis Date Noted  . Encounter for post surgical wound check 01/29/2020  . 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  . Mixed hyperlipidemia 09/17/2019  . Supraventricular tachycardia ([place]) 09/17/2019  . Chronic chest wall pain 08/28/2015  . 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 Numerous moles 01/19/2012  . Hypertension 03/17/2011  . CHEST PAIN 05/06/2010  . BACK PAIN, LUMBAR 08/06/2009    Current Outpatient Medications  Medication Sig Dispense Refill  . atorvastatin (LIPITOR) 40 MG tablet Take 1 tablet (40 mg total) by mouth daily. 90 tablet 2  . diclofenac Sodium (VOLTAREN) 1 % GEL Apply 2 g topically 4 (four) times daily. (Patient taking differently: Apply 2 g topically 4 (four) times daily as needed (arthritis pain.). ) 350 g 2  . gabapentin (NEURONTIN) 300 MG capsule Take 1 capsule (300 mg total) by mouth 3 (three) times daily. 90 capsule 4  . diltiazem (DILACOR XR) 180 MG 24 hr capsule Take 1 capsule (180 mg total) by mouth daily. 90 capsule 0   No current facility-administered medications for this visit.    Allergies: Patient has no known allergies.  Past Medical History:  Diagnosis Date  . Anxiety   . BACK PAIN, LUMBAR 08/06/2009   Qualifier: Diagnosis of  By: Regino Schultze CMA (AAMA), Apolonio Schneiders    . CHEST PAIN 05/06/2010   Qualifier: Diagnosis of  By: Sherren Mocha MD, Jory Ee   . Chronic chest wall pain 08/28/2015  . Depression   . GERD (gastroesophageal reflux disease)   . Hypertension   . Kidney stones   . Mixed hyperlipidemia 09/17/2019  . Neuralgia   . Numerous moles 01/19/2012  . 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 Supraventricular tachycardia ([place]) 09/17/2019    Past Surgical History:  Procedure Laterality Date  . APPENDECTOMY    . BACK SURGERY    . HERNIA REPAIR    . LAPAROSCOPIC GASTROTOMY W/ REPAIR OF ULCER    .  MASS EXCISION N/A 01/22/2020   Procedure: EXCISION OF XIPHOID;  Surgeon: Ivin Poot, MD;  [facility];  Service: Thoracic;  Laterality: N/A;  . right thoracotomy for exposure for the C7-T8 diskectomy  02/06/2007   Dr Arlyce Dice  . TONSILLECTOMY      Family History  Problem Relation Age of Onset  . Hypertension Mother   . Asthma Mother   . Cancer Paternal Uncle        intestinal  . Healthy Sister   . Healthy Brother   . Scoliosis Daughter   . Healthy Son   . Other Brother        tumors    Social History   Tobacco Use  . Smoking status: Former Smoker    Types: Cigarettes    Quit date: 05/23/1997    Years since quitting: 22.8  . Smokeless tobacco: Current User    Types: Chew  Substance Use Topics  . Alcohol use: Yes    Alcohol/week: 6.0 standard drinks    Types: 6 Cans of beer per week    Comment: Once a month    Subjective:  Patient presents today as a new patient; history of hypertension/ hyperlipidemia/ anxiety/ depression;  Struggling with chronic back pain- worker's comp managing;  Has been having increased problems with palpitations- would like to transfer to provider in [place]; taking Cardizem 120 mg daily (  Not extended relief)-  symptoms are worse at night;   Defers updating any type of vaccine today;    Objective:  Vitals:   04/07/20 0901  BP: 130/78  Pulse: 91  Temp: 98.3 F (36.8 C)  TempSrc: Oral  SpO2: 96%  Weight: 201 lb 6.4 oz (91.4 kg)  Height: 5' 10.5" (1.791 m)    General: Well developed, well nourished, in no acute distress  Skin : Warm and dry.  Head: Normocephalic and atraumatic  Lungs: Respirations unlabored; clear to auscultation bilaterally without wheeze, rales, rhonchi  CVS exam: normal rate and regular rhythm.  Neurologic: Alert and oriented; speech intact; face symmetrical; moves all extremities well;   Assessment:  1. Supraventricular tachycardia ([place])   2. Chronic low back pain, unspecified back pain laterality,  unspecified whether sciatica present   3. Mixed hyperlipidemia     Plan:  1. Worsening symptoms; Increase Diltiazem to 180 mg and will try XR version; transfer care to cardiology in [place] as opposed to [facility] for continuity of care; 2. Patient is having increased neuropathy and difficulty with right leg; concern for fall potential/ long term complications; he will continue to follow-up with his Worker's Comp attorney very closely while awaiting to transfer to different surgeon; if allowed by [employer], will agree to short term refill on Oxycodone.  3. Reviewed recent labs from April 2021; stay on Atorvastatin;  Follow up here in 6 months, sooner prn.   This visit occurred during the SARS-CoV-2 public health emergency.  Safety protocols were in place, including screening questions prior to the visit, additional usage of staff PPE, and extensive cleaning of exam room while observing appropriate contact time as indicated for disinfecting solutions.       No follow-ups on file.  Orders Placed This Encounter  Procedures  . Ambulatory referral to Cardiology    Referral Priority:   Routine    Referral Type:   Consultation    Referral Reason:   Specialty Services Required    Requested Specialty:   Cardiology    Number of Visits Requested:   1    Requested Prescriptions   Signed Prescriptions Disp Refills  . diltiazem (DILACOR XR) 180 MG 24 hr capsule 90 capsule 0    Sig: Take 1 capsule (180 mg total) by mouth daily.

## 2020-04-07 NOTE — Telephone Encounter (Signed)
That will be fine with me. 

## 2020-04-07 NOTE — Telephone Encounter (Signed)
Kevin Jackson is calling requesting a Provider Switch from Dr. Harriet Masson to Dr. Gardiner Rhyme due to switching all his providers to the [facility] location. Please advise.

## 2020-04-08 NOTE — Telephone Encounter (Signed)
OK with me.

## 2020-04-09 ENCOUNTER — Encounter: Payer: Self-pay | Admitting: Family

## 2020-04-10 ENCOUNTER — Other Ambulatory Visit: Payer: Self-pay | Admitting: Family

## 2020-04-10 MED ORDER — OXYCODONE-ACETAMINOPHEN 10-325 MG PO TABS
1.0000 | ORAL_TABLET | Freq: Four times a day (QID) | ORAL | 0 refills | Status: AC | PRN
Start: 2020-04-10 — End: 2020-04-15

## 2020-04-14 ENCOUNTER — Other Ambulatory Visit: Payer: Self-pay

## 2020-04-14 ENCOUNTER — Ambulatory Visit: Payer: 59 | Admitting: Cardiology

## 2020-04-14 ENCOUNTER — Encounter: Payer: Self-pay | Admitting: Cardiology

## 2020-04-14 VITALS — BP 124/84 | HR 74 | Ht 70.0 in | Wt 202.0 lb

## 2020-04-14 DIAGNOSIS — I493 Ventricular premature depolarization: Secondary | ICD-10-CM

## 2020-04-14 DIAGNOSIS — R400 Somnolence: Secondary | ICD-10-CM

## 2020-04-14 DIAGNOSIS — I471 Supraventricular tachycardia: Secondary | ICD-10-CM | POA: Diagnosis not present

## 2020-04-14 DIAGNOSIS — R002 Palpitations: Secondary | ICD-10-CM

## 2020-04-14 DIAGNOSIS — E782 Mixed hyperlipidemia: Secondary | ICD-10-CM

## 2020-04-14 NOTE — Progress Notes (Addendum)
Cardiology Office Note:    Date:  04/14/2020   ID:  Kevin Jackson, DOB 1971-01-25, MRN [id number]  PCP:  [name], FNP  Cardiologist:  Berniece Salines, DO  Electrophysiologist:  None   Referring MD: [name],*   Chief Complaint  Patient presents with  . Palpitations    History of Present Illness:    Kevin Jackson is a 49 y.o. male with a hx of SVT, hypertension, hyperlipidemia who presents for follow-up.  He was initially seen by Dr. Harriet Masson for evaluation of palpitations in April 2021.  Zio patch x14 days was done, which showed 3 runs of SVT, longest lasting 14 seconds, as well as symptomatic PVCs.  He was started on Cardizem 120 mg daily with improvement in his palpitations.  Reports that he has continued to have palpitations and last week his diltiazem dose was increased to 180 mg, with improvement in his symptoms.  He denies any lightheadedness or syncope.  Reports palpitations typically last for few seconds and then resolve.  He does not exercise.  States that most activity he does is yard work and playing with his kids.  Denies any exertional symptoms.  Reports rare episodes of left-sided chest pain that last for few seconds and resolved.  Occasional ankle edema.  Drinks coffee 2-3 times per week, denies any correlation with palpitations.  Occasional energy drink.  Drinks alcohol about once per month.  She is tobacco but no cigarette use.  No history of heart disease in his immediate family.  Echocardiogram 08/27/2015 showed LVEF 60 to 96%, grade 1 diastolic dysfunction, mild RV dilatation, mild MR.  ETT 08/18/2015 with negative for ischemia.  Past Medical History:  Diagnosis Date  . Anxiety   . BACK PAIN, LUMBAR 08/06/2009   Qualifier: Diagnosis of  By: Regino Schultze CMA (AAMA), Apolonio Schneiders    . CHEST PAIN 05/06/2010   Qualifier: Diagnosis of  By: Sherren Mocha MD, Jory Ee   . Chronic chest wall pain 08/28/2015  . Depression   . GERD (gastroesophageal reflux  disease)   . Hypertension   . Kidney stones   . Mixed hyperlipidemia 09/17/2019  . Neuralgia   . Numerous moles 01/19/2012  . 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 Supraventricular tachycardia ([place]) 09/17/2019    Past Surgical History:  Procedure Laterality Date  . APPENDECTOMY    . BACK SURGERY    . HERNIA REPAIR    . LAPAROSCOPIC GASTROTOMY W/ REPAIR OF ULCER    . MASS EXCISION N/A 01/22/2020   Procedure: EXCISION OF XIPHOID;  Surgeon: Ivin Poot, MD;  Location: [facility] OR;  Service: Thoracic;  Laterality: N/A;  . right thoracotomy for exposure for the C7-T8 diskectomy  02/06/2007   Dr Arlyce Dice  . TONSILLECTOMY      Current Medications: Current Meds  Medication Sig  . atorvastatin (LIPITOR) 40 MG tablet Take 1 tablet (40 mg total) by mouth daily.  . diclofenac Sodium (VOLTAREN) 1 % GEL Apply 2 g topically 4 (four) times daily. (Patient taking differently: Apply 2 g topically 4 (four) times daily as needed (arthritis pain.). )  . diltiazem (DILACOR XR) 180 MG 24 hr capsule Take 1 capsule (180 mg total) by mouth daily.  Marland Kitchen gabapentin (NEURONTIN) 300 MG capsule Take 1 capsule (300 mg total) by mouth 3 (three) times daily.  Marland Kitchen oxyCODONE-acetaminophen (PERCOCET) 10-325 MG tablet Take 1 tablet by mouth every 6 (six) hours as needed for up to 5 days for pain.     Allergies:  Patient has no known allergies.   Social History   Socioeconomic History  . Marital status: Married    Spouse name: Not on file  . Number of children: Not on file  . Years of education: Not on file  . Highest education level: Not on file  Occupational History    Employer: [employer]  Tobacco Use  . Smoking status: Former Smoker    Types: Cigarettes    Quit date: 05/23/1997    Years since quitting: 22.9  . Smokeless tobacco: Current User    Types: Chew  Vaping Use  . Vaping Use: Never used  Substance and Sexual Activity  . Alcohol use: Yes    Alcohol/week: 6.0 standard drinks    Types: 6 Cans of beer per  week    Comment: Once a month  . Drug use: Not Currently  . Sexual activity: Yes  Other Topics Concern  . Not on file  Social History Narrative  . Not on file   Social Determinants of Health   Financial Resource Strain:   . Difficulty of Paying Living Expenses: Not on file  Food Insecurity:   . Worried About Charity fundraiser in the Last Year: Not on file  . Ran Out of Food in the Last Year: Not on file  Transportation Needs:   . Lack of Transportation (Medical): Not on file  . Lack of Transportation (Non-Medical): Not on file  Physical Activity:   . Days of Exercise per Week: Not on file  . Minutes of Exercise per Session: Not on file  Stress:   . Feeling of Stress : Not on file  Social Connections:   . Frequency of Communication with Friends and Family: Not on file  . Frequency of Social Gatherings with Friends and Family: Not on file  . Attends Religious Services: Not on file  . Active Member of Clubs or Organizations: Not on file  . Attends Archivist Meetings: Not on file  . Marital Status: Not on file     Family History: The patient's  family history includes Asthma in his mother; Cancer in his paternal uncle; Healthy in his brother, sister, and son; Hypertension in his mother; Other in his brother; Scoliosis in his daughter.  ROS:   Please see the history of present illness.     All other systems reviewed and are negative.  EKGs/Labs/Other Studies Reviewed:    The following studies were reviewed today:   EKG:  EKG is  ordered today.  The ekg ordered today demonstrates normal sinus rhythm, rate 74, no PACs/PVCs  Recent Labs: 01/21/2020: ALT 31; BUN 14; Creatinine, Ser 1.04; Hemoglobin 15.9; Platelets 266; Potassium 3.6; Sodium 138  Recent Lipid Panel    Component Value Date/Time   CHOL 139 09/17/2019 0927   TRIG 99 09/17/2019 0927   HDL 48 09/17/2019 0927   CHOLHDL 2.9 09/17/2019 0927   LDLCALC 73 09/17/2019 0927    Physical Exam:    VS:   BP 124/84   Pulse 74   Ht 5\' 10"  (1.778 m)   Wt 202 lb (91.6 kg)   SpO2 97%   BMI 28.98 kg/m     Wt Readings from Last 3 Encounters:  04/14/20 202 lb (91.6 kg)  04/07/20 201 lb 6.4 oz (91.4 kg)  03/18/20 195 lb (88.5 kg)     GEN:  Well nourished, well developed in no acute distress HEENT: Normal NECK: No JVD; No carotid bruits CARDIAC: RRR, no murmurs, rubs,  gallops RESPIRATORY:  Clear to auscultation without rales, wheezing or rhonchi  ABDOMEN: Soft, non-tender, non-distended MUSCULOSKELETAL:  No edema; No deformity  SKIN: Warm and dry NEUROLOGIC:  Alert and oriented x 3 PSYCHIATRIC:  Normal affect   ASSESSMENT:    1. PVC's (premature ventricular contractions)   2. Palpitations   3. Supraventricular tachycardia ([place])   4. Mixed hyperlipidemia   5. Daytime somnolence    PLAN:    Palpitations: Zio patch showed short episodes of SVT, up to 14 seconds.  Appears to be symptomatic from PVCs.  Reports symptoms have improved with diltiazem.  Suspect untreated sleep apnea may be contributing to arrhythmias -Continue diltiazem 180 mg daily -Sleep study -Will check echocardiogram to evaluate for structural heart disease  Hyperlipidemia: On atorvastatin 40 mg daily.  LDL 73 on 09/17/2019.  Daytime somnolence/observed apnea events: Referred for sleep study.  Tobacco use: Uses chewing tobacco.  Cessation strongly recommended.  RTC in 3 months  Medication Adjustments/Labs and Tests Ordered: Current medicines are reviewed at length with the patient today.  Concerns regarding medicines are outlined above.  Orders Placed This Encounter  Procedures  . ECHOCARDIOGRAM COMPLETE   No orders of the defined types were placed in this encounter.   Patient Instructions  Medication Instructions:  Your physician recommends that you continue on your current medications as directed. Please refer to the Current Medication list given to you today.  *If you need a refill on your cardiac  medications before your next appointment, please call your pharmacy*  Testing/Procedures: Your physician has requested that you have an echocardiogram. Echocardiography is a painless test that uses sound waves to create images of your heart. It provides your doctor with information about the size and shape of your heart and how well your heart's chambers and valves are working. This procedure takes approximately one hour. There are no restrictions for this procedure.  This will be done at our [facility] location:  [address]  Sleep study to be rescheduled  Follow-Up: At [facility], you and your health needs are our priority.  As part of our continuing mission to provide you with exceptional heart care, we have created designated Provider Care Teams.  These Care Teams include your primary Cardiologist (physician) and Advanced Practice Providers (APPs -  Physician Assistants and Nurse Practitioners) who all work together to provide you with the care you need, when you need it.  We recommend signing up for the patient portal called "MyChart".  Sign up information is provided on this After Visit Summary.  MyChart is used to connect with patients for Virtual Visits (Telemedicine).  Patients are able to view lab/test results, encounter notes, upcoming appointments, etc.  Non-urgent messages can be sent to your provider as well.   To learn more about what you can do with MyChart, go to [email or web address].    Your next appointment:   3 month(s)  The format for your next appointment:   In Person  Provider:   Oswaldo Milian, MD        Signed, Donato Heinz, MD  04/14/2020 5:15 PM    [place]

## 2020-04-14 NOTE — Patient Instructions (Signed)
Medication Instructions:  Your physician recommends that you continue on your current medications as directed. Please refer to the Current Medication list given to you today.  *If you need a refill on your cardiac medications before your next appointment, please call your pharmacy*  Testing/Procedures: Your physician has requested that you have an echocardiogram. Echocardiography is a painless test that uses sound waves to create images of your heart. It provides your doctor with information about the size and shape of your heart and how well your heart's chambers and valves are working. This procedure takes approximately one hour. There are no restrictions for this procedure.  This will be done at our [facility] location:  [address]  Sleep study to be rescheduled  Follow-Up: At [facility], you and your health needs are our priority.  As part of our continuing mission to provide you with exceptional heart care, we have created designated Provider Care Teams.  These Care Teams include your primary Cardiologist (physician) and Advanced Practice Providers (APPs -  Physician Assistants and Nurse Practitioners) who all work together to provide you with the care you need, when you need it.  We recommend signing up for the patient portal called "MyChart".  Sign up information is provided on this After Visit Summary.  MyChart is used to connect with patients for Virtual Visits (Telemedicine).  Patients are able to view lab/test results, encounter notes, upcoming appointments, etc.  Non-urgent messages can be sent to your provider as well.   To learn more about what you can do with MyChart, go to [email or web address].    Your next appointment:   3 month(s)  The format for your next appointment:   In Person  Provider:   Oswaldo Milian, MD

## 2020-04-15 ENCOUNTER — Telehealth: Payer: Self-pay | Admitting: Cardiology

## 2020-04-15 NOTE — Telephone Encounter (Signed)
PA pending #[id number]

## 2020-04-20 NOTE — Telephone Encounter (Signed)
PA approved for split night for dos 06-01-20 thru 07-29-20.  Waiting on sleep lab to call back to schedule.

## 2020-04-21 ENCOUNTER — Other Ambulatory Visit (INDEPENDENT_AMBULATORY_CARE_PROVIDER_SITE_OTHER): Payer: 59

## 2020-04-21 DIAGNOSIS — I10 Essential (primary) hypertension: Secondary | ICD-10-CM

## 2020-04-21 NOTE — Telephone Encounter (Signed)
Called patient and lm that he is scheduled for Thursday, January 13 at 8 pm, to expect info packet and left the sleep lab number.

## 2020-05-12 ENCOUNTER — Encounter: Payer: Self-pay | Admitting: Orthopaedic Surgery

## 2020-05-12 ENCOUNTER — Ambulatory Visit (INDEPENDENT_AMBULATORY_CARE_PROVIDER_SITE_OTHER): Payer: Worker's Compensation | Admitting: Orthopaedic Surgery

## 2020-05-12 ENCOUNTER — Other Ambulatory Visit: Payer: Self-pay

## 2020-05-12 VITALS — BP 135/91 | HR 78 | Ht 70.0 in | Wt 200.0 lb

## 2020-05-12 DIAGNOSIS — M5126 Other intervertebral disc displacement, lumbar region: Secondary | ICD-10-CM

## 2020-05-13 ENCOUNTER — Ambulatory Visit (HOSPITAL_COMMUNITY): Payer: 59 | Attending: Cardiology

## 2020-05-13 DIAGNOSIS — I493 Ventricular premature depolarization: Secondary | ICD-10-CM | POA: Insufficient documentation

## 2020-05-13 LAB — ECHOCARDIOGRAM COMPLETE
Area-P 1/2: 4.68 cm2
S' Lateral: 2.6 cm

## 2020-05-13 NOTE — Progress Notes (Signed)
Office Visit Note   [name]           Date of Birth: 05-Aug-1970           MRN: [id number] Visit Date: 05/12/2020              Requested by: [name], [place],  [place] [id number] PCP: [name], FNP   Assessment & Plan: Visit Diagnoses:  1. Protrusion of lumbar intervertebral disc     Plan: Patient saw Dr. Patrice Paradise for second opinion who felt that he was definitely candidate for further treatment and surgery options discectomy versus single level fusion.  His note states that he favored single level fusion at the L3-4 level on the right with his disc protrusion.  Patient can talk with Worker's Comp. about pursuing further treatment with Dr.Cohen as he had recommended.      Follow-Up Instructions: No follow-ups on file.   Orders:  No orders of the defined types were placed in this encounter.  No orders of the defined types were placed in this encounter.     Procedures: No procedures performed   Clinical Data: No additional findings.   Subjective: Chief Complaint  Patient presents with  . Lower Back - Pain, Follow-up    OTJI 07/26/2019    HPI 49 year old male returns for second opinion for follow-up on-the-job injury from 07/26/2019.  Patient saw Dr. Patrice Paradise on 03/05/2020.  He reviewed patient's past problems with continued pain after on-the-job injury with persistent problems with right leg occasionally catching his toe and stumbling.  Far lateral disc protrusion and foraminal protrusion L3-L4 disc on the right.  Patient states he has more problems with his lower back than he does with this thoracic spine .  Patient had T7-T8 transthoracic discectomy with persistent problems afterwards with continued ongoing pain.  Patient states he has back pain thigh pain burning type pain that radiates into his groin numbness over the dorsum of his foot.  He has had some limping.  Ongoing pain and is ready to get something  done.  Patient work history reviewed updated over the last 10 years with different jobs.   Review of Systems plans for treatment for hypertension.  Previous thoracic disc herniation with surgery as above.  All the systems noncontributory to HPI.   Objective: Vital Signs: BP (!) 135/91   Pulse 78   Ht 5\' 10"  (1.778 m)   Wt 200 lb (90.7 kg)   BMI 28.70 kg/m   Physical Exam Constitutional:      Appearance: He is well-developed and well-nourished.  HENT:     Head: Normocephalic and atraumatic.  Eyes:     Extraocular Movements: EOM normal.     Pupils: Pupils are equal, round, and reactive to light.  Neck:     Thyroid: No thyromegaly.     Trachea: No tracheal deviation.  Cardiovascular:     Rate and Rhythm: Normal rate.  Pulmonary:     Effort: Pulmonary effort is normal.     Breath sounds: No wheezing.  Abdominal:     General: Bowel sounds are normal.     Palpations: Abdomen is soft.  Skin:    General: Skin is warm and dry.     Capillary Refill: Capillary refill takes less than 2 seconds.  Neurological:     Mental Status: He is alert and oriented to person, place, and time.  Psychiatric:        Mood and Affect:  Mood and affect normal.        Behavior: Behavior normal.        Thought Content: Thought content normal.        Judgment: Judgment normal.     Ortho Exam patient has trace anterior tib weakness no anterior tib atrophy.  He is able to heel walk and also can toe walk right and left.  Negative logroll to the hips.  Specialty Comments:  No specialty comments available.  Imaging: No results found.   PMFS History: Patient Active Problem List   Diagnosis Date Noted  . Encounter for post surgical wound check 01/29/2020  . 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  . Mixed hyperlipidemia 09/17/2019  . Supraventricular tachycardia ([place]) 09/17/2019  . Chronic chest wall pain 08/28/2015  . 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  Numerous moles 01/19/2012  . Hypertension 03/17/2011  . CHEST PAIN 05/06/2010  . BACK PAIN, LUMBAR 08/06/2009   Past Medical History:  Diagnosis Date  . Anxiety   . BACK PAIN, LUMBAR 08/06/2009   Qualifier: Diagnosis of  By: Regino Schultze CMA (AAMA), Apolonio Schneiders    . CHEST PAIN 05/06/2010   Qualifier: Diagnosis of  By: Sherren Mocha MD, Jory Ee   . Chronic chest wall pain 08/28/2015  . Depression   . GERD (gastroesophageal reflux disease)   . Hypertension   . Kidney stones   . Mixed hyperlipidemia 09/17/2019  . Neuralgia   . Numerous moles 01/19/2012  . 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 Supraventricular tachycardia ([place]) 09/17/2019    Family History  Problem Relation Age of Onset  . Hypertension Mother   . Asthma Mother   . Cancer Paternal Uncle        intestinal  . Healthy Sister   . Healthy Brother   . Scoliosis Daughter   . Healthy Son   . Other Brother        tumors    Past Surgical History:  Procedure Laterality Date  . APPENDECTOMY    . BACK SURGERY    . HERNIA REPAIR    . LAPAROSCOPIC GASTROTOMY W/ REPAIR OF ULCER    . MASS EXCISION N/A 01/22/2020   Procedure: EXCISION OF XIPHOID;  Surgeon: Ivin Poot, MD;  Location: [facility] OR;  Service: Thoracic;  Laterality: N/A;  . right thoracotomy for exposure for the C7-T8 diskectomy  02/06/2007   Dr Arlyce Dice  . TONSILLECTOMY     Social History   Occupational History    Employer: [employer]  Tobacco Use  . Smoking status: Former Smoker    Types: Cigarettes    Quit date: 05/23/1997    Years since quitting: 22.9  . Smokeless tobacco: Current User    Types: Chew  Vaping Use  . Vaping Use: Never used  Substance and Sexual Activity  . Alcohol use: Yes    Alcohol/week: 6.0 standard drinks    Types: 6 Cans of beer per week    Comment: Once a month  . Drug use: Not Currently  . Sexual activity: Yes

## 2020-05-25 ENCOUNTER — Telehealth: Payer: Self-pay | Admitting: Orthopaedic Surgery

## 2020-05-25 NOTE — Telephone Encounter (Signed)
Shena with [facility] called stating they needed to get a deposition from Dr.Yates and would like our office to call so we can set that up, please.   Shena CB# [phone] Ext* [phone]

## 2020-05-26 ENCOUNTER — Telehealth: Payer: Self-pay | Admitting: Orthopaedic Surgery

## 2020-06-04 ENCOUNTER — Ambulatory Visit (HOSPITAL_BASED_OUTPATIENT_CLINIC_OR_DEPARTMENT_OTHER): Payer: 59 | Attending: Cardiology | Admitting: Cardiology

## 2020-06-04 ENCOUNTER — Other Ambulatory Visit: Payer: Self-pay

## 2020-06-04 DIAGNOSIS — R400 Somnolence: Secondary | ICD-10-CM | POA: Insufficient documentation

## 2020-06-04 DIAGNOSIS — R5383 Other fatigue: Secondary | ICD-10-CM | POA: Insufficient documentation

## 2020-06-04 DIAGNOSIS — R0683 Snoring: Secondary | ICD-10-CM | POA: Insufficient documentation

## 2020-06-05 ENCOUNTER — Telehealth: Payer: Self-pay | Admitting: *Deleted

## 2020-06-05 NOTE — Procedures (Signed)
   Patient Name: Kevin Jackson, Kevin Jackson Study Date 06/04/2020 Gender: Male D.O.B: [birth year] Age (years): 7 Referring Provider: Godfrey Pick Tobb DO Height (inches): 70 Interpreting Physician: Fransico Him MD, ABSM Weight (lbs): 208 RPSGT: Jorge Ny BMI: 30 MRN: [id number] Neck Size: 16.00  CLINICAL INFORMATION Sleep Study Type: NPSG  Indication for sleep study: Depression, Hypertension, Obesity, Snoring, Witnesses Apnea / Gasping During Sleep  Epworth Sleepiness Score: 1  SLEEP STUDY TECHNIQUE As per the AASM Manual for the Scoring of Sleep and Associated Events v2.3 (April 2016) with a hypopnea requiring 4% desaturations.  The channels recorded and monitored were frontal, central and occipital EEG, electrooculogram (EOG), submentalis EMG (chin), nasal and oral airflow, thoracic and abdominal wall motion, anterior tibialis EMG, snore microphone, electrocardiogram, and pulse oximetry.  MEDICATIONS Medications self-administered by patient taken the night of the study : N/A  SLEEP ARCHITECTURE The study was initiated at 10:15:38 PM and ended at 4:42:40 AM.  Sleep onset time was 12.1 minutes and the sleep efficiency was 93.3%. The total sleep time was 361 minutes.  Stage REM latency was 141.0 minutes.  The patient spent 6.2% of the night in stage N1 sleep, 72.9% in stage N2 sleep, 0.0% in stage N3 and 20.9% in REM.  Alpha intrusion was absent.  Supine sleep was 22.36%.  RESPIRATORY PARAMETERS The overall apnea/hypopnea index (AHI) was 3.8 per hour. There were 1 total apneas, including 1 obstructive, 0 central and 0 mixed apneas. There were 22 hypopneas and 35 RERAs.  The AHI during Stage REM sleep was 3.2 per hour.  AHI while supine was 13.4 per hour.  The mean oxygen saturation was 94.4%. The minimum SpO2 during sleep was 87.0%.  moderate snoring was noted during this study.  CARDIAC DATA The 2 lead EKG demonstrated sinus rhythm. The mean heart rate was 65.9  beats per minute. Other EKG findings include: None.  LEG MOVEMENT DATA The total PLMS were 0 with a resulting PLMS index of 0.0. Associated arousal with leg movement index was 0.5 .  IMPRESSIONS - No significant obstructive sleep apnea occurred during this study (AHI = 3.8/h). - No significant central sleep apnea occurred during this study (CAI = 0.0/h). - Mild oxygen desaturation was noted during this study (Min O2 = 87.0%). - The patient snored with moderate snoring volume. - No cardiac abnormalities were noted during this study. - Clinically significant periodic limb movements did not occur during sleep. No significant associated arousals.  DIAGNOSIS - Normal Study  RECOMMENDATIONS - Positional therapy avoiding supine position during sleep. - Avoid alcohol, sedatives and other CNS depressants that may worsen sleep apnea and disrupt normal sleep architecture. - Sleep hygiene should be reviewed to assess factors that may improve sleep quality. - Weight management and regular exercise should be initiated or continued if appropriate.  [Electronically signed] 06/05/2020 10:35 AM  Fransico Him MD, ABSM Diplomate, American Board of Sleep Medicine

## 2020-06-05 NOTE — Telephone Encounter (Signed)
-----   Message from Sueanne Margarita, MD sent at 06/05/2020 10:37 AM EST ----- Please let patient know that sleep study showed no significant sleep apnea.

## 2020-06-05 NOTE — Telephone Encounter (Signed)
Informed patient of sleep study results and patient understanding was verbalized. Patient understands her sleep study showed showed no significant sleep apnea.   Left detailed message on voicemail and informed patient to call back with questions.   Marland Kitchen

## 2020-06-14 ENCOUNTER — Encounter: Payer: Self-pay | Admitting: Specialist

## 2020-06-16 ENCOUNTER — Other Ambulatory Visit: Payer: Self-pay | Admitting: Specialist

## 2020-06-16 MED ORDER — DICLOFENAC SODIUM 1 % EX GEL: 2.0000 g | Freq: Four times a day (QID) | CUTANEOUS | 3 refills | Status: DC | PRN

## 2020-06-16 MED ORDER — GABAPENTIN 300 MG PO CAPS: 300.0000 mg | ORAL_CAPSULE | Freq: Three times a day (TID) | ORAL | 4 refills | Status: DC

## 2020-06-30 ENCOUNTER — Other Ambulatory Visit: Payer: Self-pay | Admitting: Family

## 2020-06-30 ENCOUNTER — Other Ambulatory Visit: Payer: Self-pay

## 2020-06-30 ENCOUNTER — Encounter: Payer: Self-pay | Admitting: Specialist

## 2020-07-03 ENCOUNTER — Encounter: Payer: Self-pay | Admitting: Family

## 2020-07-03 ENCOUNTER — Other Ambulatory Visit: Payer: Self-pay

## 2020-07-03 ENCOUNTER — Ambulatory Visit: Payer: 59 | Admitting: Family

## 2020-07-03 VITALS — BP 126/84 | HR 96 | Temp 98.4°F | Ht 70.0 in | Wt 206.0 lb

## 2020-07-03 DIAGNOSIS — I10 Essential (primary) hypertension: Secondary | ICD-10-CM

## 2020-07-03 DIAGNOSIS — R7989 Other specified abnormal findings of blood chemistry: Secondary | ICD-10-CM

## 2020-07-03 DIAGNOSIS — N281 Cyst of kidney, acquired: Secondary | ICD-10-CM

## 2020-07-03 DIAGNOSIS — E785 Hyperlipidemia, unspecified: Secondary | ICD-10-CM | POA: Diagnosis not present

## 2020-07-03 LAB — CBC WITH DIFFERENTIAL/PLATELET
Basophils Absolute: 0 10*3/uL (ref 0.0–0.1)
Basophils Relative: 0.5 % (ref 0.0–3.0)
Eosinophils Absolute: 0.1 10*3/uL (ref 0.0–0.7)
Eosinophils Relative: 1 % (ref 0.0–5.0)
HCT: 43.9 % (ref 39.0–52.0)
Hemoglobin: 15.5 g/dL (ref 13.0–17.0)
Lymphocytes Relative: 17.7 % (ref 12.0–46.0)
Lymphs Abs: 1.5 10*3/uL (ref 0.7–4.0)
MCHC: 35.4 g/dL (ref 30.0–36.0)
MCV: 88.5 fl (ref 78.0–100.0)
Monocytes Absolute: 0.8 10*3/uL (ref 0.1–1.0)
Monocytes Relative: 10.1 % (ref 3.0–12.0)
Neutro Abs: 5.8 10*3/uL (ref 1.4–7.7)
Neutrophils Relative %: 70.7 % (ref 43.0–77.0)
Platelets: 261 10*3/uL (ref 150.0–400.0)
RBC: 4.96 Mil/uL (ref 4.22–5.81)
RDW: 13.7 % (ref 11.5–15.5)
WBC: 8.2 10*3/uL (ref 4.0–10.5)

## 2020-07-03 LAB — COMPREHENSIVE METABOLIC PANEL
ALT: 41 U/L (ref 0–53)
AST: 23 U/L (ref 0–37)
Albumin: 4.6 g/dL (ref 3.5–5.2)
Alkaline Phosphatase: 85 U/L (ref 39–117)
BUN: 12 mg/dL (ref 6–23)
CO2: 28 mEq/L (ref 19–32)
Calcium: 9.4 mg/dL (ref 8.4–10.5)
Chloride: 104 mEq/L (ref 96–112)
Creatinine, Ser: 1.31 mg/dL (ref 0.40–1.50)
GFR: 63.97 mL/min (ref >60.00)
Glucose, Bld: 97 mg/dL (ref 70–99)
Potassium: 3.5 mEq/L (ref 3.5–5.1)
Sodium: 140 mEq/L (ref 135–145)
Total Bilirubin: 1 mg/dL (ref 0.2–1.2)
Total Protein: 7.5 g/dL (ref 6.0–8.3)

## 2020-07-03 LAB — LIPID PANEL
Cholesterol: 123 mg/dL (ref 0–200)
HDL: 41.3 mg/dL (ref >39.00)
LDL Cholesterol: 59 mg/dL (ref 0–99)
NonHDL: 81.81
Total CHOL/HDL Ratio: 3
Triglycerides: 114 mg/dL (ref 0.0–149.0)
VLDL: 22.8 mg/dL (ref 0.0–40.0)

## 2020-07-03 LAB — TESTOSTERONE: Testosterone: 238.86 ng/dL — ABNORMAL LOW (ref 300.00–890.00)

## 2020-07-03 NOTE — Progress Notes (Signed)
Kevin Jackson is a 50 y.o. male with the following history as recorded in EpicCare:  Patient Active Problem List   Diagnosis Date Noted  . Daytime somnolence 06/04/2020  . Encounter for post surgical wound check 01/29/2020  . 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  . Mixed hyperlipidemia 09/17/2019  . Supraventricular tachycardia ([place]) 09/17/2019  . Chronic chest wall pain 08/28/2015  . 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 Numerous moles 01/19/2012  . Hypertension 03/17/2011  . CHEST PAIN 05/06/2010  . BACK PAIN, LUMBAR 08/06/2009    Current Outpatient Medications  Medication Sig Dispense Refill  . atorvastatin (LIPITOR) 40 MG tablet Take 1 tablet (40 mg total) by mouth daily. 90 tablet 2  . diclofenac Sodium (VOLTAREN) 1 % GEL Apply 2 g topically 4 (four) times daily as needed (arthritis pain.). 350 g 3  . diltiazem (TIAZAC) 180 MG 24 hr capsule Take 1 capsule by mouth once daily 90 capsule 0  . gabapentin (NEURONTIN) 300 MG capsule Take 1 capsule (300 mg total) by mouth 3 (three) times daily. 90 capsule 4  . metoprolol tartrate (LOPRESSOR) 50 MG tablet Take 50 mg by mouth 2 (two) times daily.     No current facility-administered medications for this visit.    Allergies: Patient has no known allergies.  Past Medical History:  Diagnosis Date  . Anxiety   . BACK PAIN, LUMBAR 08/06/2009   Qualifier: Diagnosis of  By: Regino Schultze CMA (AAMA), Apolonio Schneiders    . CHEST PAIN 05/06/2010   Qualifier: Diagnosis of  By: Sherren Mocha MD, Jory Ee   . Chronic chest wall pain 08/28/2015  . Depression   . GERD (gastroesophageal reflux disease)   . Hypertension   . Kidney stones   . Mixed hyperlipidemia 09/17/2019  . Neuralgia   . Numerous moles 01/19/2012  . 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 Supraventricular tachycardia ([place]) 09/17/2019    Past Surgical History:  Procedure Laterality Date  . APPENDECTOMY    . BACK SURGERY    . HERNIA REPAIR    . LAPAROSCOPIC  GASTROTOMY W/ REPAIR OF ULCER    . MASS EXCISION N/A 01/22/2020   Procedure: EXCISION OF XIPHOID;  Surgeon: Ivin Poot, MD;  Location[facility] OR;  Service: Thoracic;  Laterality: N/A;  . right thoracotomy for exposure for the C7-T8 diskectomy  02/06/2007   Dr Arlyce Dice  . TONSILLECTOMY      Family History  Problem Relation Age of Onset  . Hypertension Mother   . Asthma Mother   . Cancer Paternal Uncle        intestinal  . Healthy Sister   . Healthy Brother   . Scoliosis Daughter   . Healthy Son   . Other Brother        tumors    Social History   Tobacco Use  . Smoking status: Former Smoker    Types: Cigarettes    Quit date: 05/23/1997    Years since quitting: 23.1  . Smokeless tobacco: Current User    Types: Chew  Substance Use Topics  . Alcohol use: Yes    Alcohol/week: 6.0 standard drinks    Types: 6 Cans of beer per week    Comment: Once a month    Subjective:   Follow-up on palpitations- has felt much better with medication change; Concerned about history of low testosterone; wonders if this could be contributing to his fatigue; is not comfortable with using Viagra due to possible cardiac side effects;  Will be seeing neurosurgeon later this month;     Objective:  Vitals:   07/03/20 1509  BP: 126/84  Pulse: 96  Temp: 98.4 F (36.9 C)  TempSrc: Oral  SpO2: 96%  Weight: 206 lb (93.4 kg)  Height: _0  (1.778 m)    General: Well developed, well nourished, in no acute distress  Skin : Warm and dry.  Head: Normocephalic and atraumatic  Eyes: Sclera and conjunctiva clear; pupils round and reactive to light; extraocular movements intact  Ears: External normal; canals clear; tympanic membranes normal  Oropharynx: Pink, supple. No suspicious lesions  Neck: Supple without thyromegaly, adenopathy  Lungs: Respirations unlabored; clear to auscultation bilaterally without wheeze, rales, rhonchi  CVS exam: normal rate and regular rhythm.  Neurologic: Alert and  oriented; speech intact; face symmetrical; moves all extremities well; CNII-XII intact without focal deficit   Assessment:  1. Hyperlipidemia, unspecified hyperlipidemia type   2. Low testosterone   3. Bilateral renal cysts   4. Primary hypertension     Plan:  1. Check lipid panel today; continue Atorvastatin 40 mg daily; 2. Check testosterone; will need to refer to urology to discuss treatment; 3. Update renal ultrasound; may need to refer to nephrology; 4. Stable- good response to Diltiazem 180 mg;  Time spent 30 minutes;  This visit occurred during the SARS-CoV-2 public health emergency.  Safety protocols were in place, including screening questions prior to the visit, additional usage of staff PPE, and extensive cleaning of exam room while observing appropriate contact time as indicated for disinfecting solutions.      No follow-ups on file.  Orders Placed This Encounter  Procedures  . US Renal    Standing Status:   Future    Standing Expiration Date:   07/03/2021    Order Specific Question:   Reason for Exam (SYMPTOM  OR DIAGNOSIS REQUIRED)    Answer:   bilateral renal cysts    Order Specific Question:   Preferred imaging location?    Answer:   [facility]  . CBC with Differential/Platelet    Standing Status:   Future    Number of Occurrences:   1    Standing Expiration Date:   07/03/2021  . Comp Met (CMET)    Standing Status:   Future    Number of Occurrences:   1    Standing Expiration Date:   07/03/2021  . Lipid panel    Standing Status:   Future    Number of Occurrences:   1    Standing Expiration Date:   07/03/2021  . Testosterone    Standing Status:   Future    Number of Occurrences:   1    Standing Expiration Date:   07/03/2021    Requested Prescriptions    No prescriptions requested or ordered in this encounter

## 2020-07-06 ENCOUNTER — Other Ambulatory Visit: Payer: Self-pay | Admitting: Family

## 2020-07-06 DIAGNOSIS — R7989 Other specified abnormal findings of blood chemistry: Secondary | ICD-10-CM

## 2020-07-15 ENCOUNTER — Emergency Department (HOSPITAL_BASED_OUTPATIENT_CLINIC_OR_DEPARTMENT_OTHER): Payer: 59

## 2020-07-15 ENCOUNTER — Other Ambulatory Visit (HOSPITAL_COMMUNITY): Payer: Self-pay | Admitting: Emergency Medicine

## 2020-07-15 ENCOUNTER — Other Ambulatory Visit: Payer: Self-pay

## 2020-07-15 ENCOUNTER — Emergency Department (HOSPITAL_BASED_OUTPATIENT_CLINIC_OR_DEPARTMENT_OTHER)
Admission: EM | Admit: 2020-07-15 | Discharge: 2020-07-15 | Disposition: A | Discharge status: Home / Self Care | Payer: 59 | Source: Home / Self Care | Attending: Emergency Medicine | Admitting: Emergency Medicine

## 2020-07-15 DIAGNOSIS — N132 Hydronephrosis with renal and ureteral calculous obstruction: Secondary | ICD-10-CM | POA: Insufficient documentation

## 2020-07-15 DIAGNOSIS — Z87891 Personal history of nicotine dependence: Secondary | ICD-10-CM | POA: Insufficient documentation

## 2020-07-15 DIAGNOSIS — I10 Essential (primary) hypertension: Secondary | ICD-10-CM | POA: Insufficient documentation

## 2020-07-15 DIAGNOSIS — Z79899 Other long term (current) drug therapy: Secondary | ICD-10-CM | POA: Insufficient documentation

## 2020-07-15 DIAGNOSIS — Z87442 Personal history of urinary calculi: Secondary | ICD-10-CM | POA: Insufficient documentation

## 2020-07-15 DIAGNOSIS — N200 Calculus of kidney: Secondary | ICD-10-CM

## 2020-07-15 DIAGNOSIS — Z79891 Long term (current) use of opiate analgesic: Secondary | ICD-10-CM | POA: Diagnosis not present

## 2020-07-15 DIAGNOSIS — K219 Gastro-esophageal reflux disease without esophagitis: Secondary | ICD-10-CM | POA: Insufficient documentation

## 2020-07-15 DIAGNOSIS — Z20822 Contact with and (suspected) exposure to covid-19: Secondary | ICD-10-CM | POA: Diagnosis not present

## 2020-07-15 LAB — COMPREHENSIVE METABOLIC PANEL
ALT: 37 U/L (ref 0–44)
AST: 24 U/L (ref 15–41)
Albumin: 4.8 g/dL (ref 3.5–5.0)
Alkaline Phosphatase: 86 U/L (ref 38–126)
Anion gap: 11 (ref 5–15)
BUN: 12 mg/dL (ref 6–20)
CO2: 26 mmol/L (ref 22–32)
Calcium: 9.2 mg/dL (ref 8.9–10.3)
Chloride: 102 mmol/L (ref 98–111)
Creatinine, Ser: 1.15 mg/dL (ref 0.61–1.24)
GFR, Estimated: >60 mL/min (ref >60)
Glucose, Bld: 124 mg/dL — ABNORMAL HIGH (ref 70–99)
Potassium: 3.4 mmol/L — ABNORMAL LOW (ref 3.5–5.1)
Sodium: 139 mmol/L (ref 135–145)
Total Bilirubin: 1.6 mg/dL — ABNORMAL HIGH (ref 0.3–1.2)
Total Protein: 7.7 g/dL (ref 6.5–8.1)

## 2020-07-15 LAB — CBC WITH DIFFERENTIAL/PLATELET
Abs Immature Granulocytes: 0.03 10*3/uL (ref 0.00–0.07)
Basophils Absolute: 0.1 10*3/uL (ref 0.0–0.1)
Basophils Relative: 1 %
Eosinophils Absolute: 0.1 10*3/uL (ref 0.0–0.5)
Eosinophils Relative: 1 %
HCT: 47.9 % (ref 39.0–52.0)
Hemoglobin: 16.9 g/dL (ref 13.0–17.0)
Immature Granulocytes: 0 %
Lymphocytes Relative: 17 %
Lymphs Abs: 1.4 10*3/uL (ref 0.7–4.0)
MCH: 31.7 pg (ref 26.0–34.0)
MCHC: 35.3 g/dL (ref 30.0–36.0)
MCV: 89.9 fL (ref 80.0–100.0)
Monocytes Absolute: 0.8 10*3/uL (ref 0.1–1.0)
Monocytes Relative: 10 %
Neutro Abs: 5.7 10*3/uL (ref 1.7–7.7)
Neutrophils Relative %: 71 %
Platelets: 273 10*3/uL (ref 150–400)
RBC: 5.33 MIL/uL (ref 4.22–5.81)
RDW: 13.2 % (ref 11.5–15.5)
WBC: 8 10*3/uL (ref 4.0–10.5)
nRBC: 0 % (ref 0.0–0.2)

## 2020-07-15 MED ORDER — ONDANSETRON HCL 4 MG/2ML IJ SOLN
4.0000 mg | Freq: Once | INTRAMUSCULAR | Status: AC
  Administered 2020-07-15: 4 mg via INTRAVENOUS
  Filled 2020-07-15: qty 2

## 2020-07-15 MED ORDER — OXYCODONE-ACETAMINOPHEN 5-325 MG PO TABS: 1.0000 | ORAL_TABLET | Freq: Four times a day (QID) | ORAL | 0 refills | Status: DC | PRN

## 2020-07-15 MED ORDER — TAMSULOSIN HCL 0.4 MG PO CAPS: 0.4000 mg | ORAL_CAPSULE | Freq: Every day | ORAL | 0 refills | Status: DC

## 2020-07-15 MED ORDER — HYDROMORPHONE HCL 1 MG/ML IJ SOLN
1.0000 mg | Freq: Once | INTRAMUSCULAR | Status: AC
Start: 2020-07-15 — End: 2020-07-15
  Administered 2020-07-15: 1 mg via INTRAVENOUS
  Filled 2020-07-15: qty 1

## 2020-07-15 MED ORDER — KETOROLAC TROMETHAMINE 15 MG/ML IJ SOLN
15.0000 mg | Freq: Once | INTRAMUSCULAR | Status: AC
  Administered 2020-07-15: 15 mg via INTRAVENOUS
  Filled 2020-07-15: qty 1

## 2020-07-15 MED FILL — TAMSULOSIN HCL 0.4 MG CAP: 0.4 | 5 days supply | Qty: 5 | Fill #0

## 2020-07-15 MED FILL — OXYCODONE-ACETAMINOPHEN 5-3: 5-325 | 3 days supply | Qty: 15 | Fill #0

## 2020-07-15 NOTE — ED Triage Notes (Signed)
Pt arrived from home stating that he is passing blood in his urine. Pt states that he also has ruptured discs at L4 and cysts in both kidneys. MRI revealed Kidney stones in right kidney 6 months ago. Pt has pain in groin area radiating down his right leg.

## 2020-07-15 NOTE — ED Provider Notes (Signed)
[facility] Provider Note   CSN: [id number] Arrival date & time: 07/15/20  5462     History Chief Complaint  Patient presents with  . Hematuria    Kevin Jackson is a 50 y.o. male.  Patient is a 50 year old male with a history of kidney stones and cysts, hyperlipidemia, hypertension, SVT, GERD, back pain due to significant disc disease currently waiting to see neurosurgery at surgery later this month who is presenting today due to severe right flank pain.  Patient reports 3 to 4 days ago he had an episode of right flank/back pain and dark-colored urine that seemed to resolve but then at 3 AM this morning he was woke up with similar pain and now reports that his urine appears grossly bloody.  He has urinary frequency and urgency but reports now he is unable to urinate at all.  He continues to have pain in his right flank which he reports he thinks could be a kidney stone but also due to all of his back issues that also causes pain in this area that radiates down the leg.  He does not take anything daily for pain and has only taken his prescription medications this morning including his Neurontin.  He denies any fever, nausea or vomiting.  He did have an MRI that he reported had cyst which they told him appeared to be blocking some flow to his kidneys and he is waiting for urologist to call him back for follow-up.  He has never had any issues with kidney disease or trouble with kidney function.  He has never required stents or lithotripsy in the past.  The history is provided by the patient.  Hematuria This is a new problem. The current episode started 1 to 2 hours ago. The problem occurs hourly. The problem has not changed since onset.Associated symptoms comments: Right flank pain that is 10/10 and radiating to the right testicles.  Some urinary hesitancy and frequency but no dysuria.  No fever, n/v/d.Marland Kitchen Nothing aggravates the symptoms. Nothing relieves the  symptoms. He has tried nothing for the symptoms. The treatment provided no relief.       Past Medical History:  Diagnosis Date  . Anxiety   . BACK PAIN, LUMBAR 08/06/2009   Qualifier: Diagnosis of  By: Regino Schultze CMA (AAMA), Apolonio Schneiders    . CHEST PAIN 05/06/2010   Qualifier: Diagnosis of  By: Sherren Mocha MD, Jory Ee   . Chronic chest wall pain 08/28/2015  . Depression   . GERD (gastroesophageal reflux disease)   . Hypertension   . Kidney stones   . Mixed hyperlipidemia 09/17/2019  . Neuralgia   . Numerous moles 01/19/2012  . 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 Supraventricular tachycardia ([place]) 09/17/2019    Patient Active Problem List   Diagnosis Date Noted  . Daytime somnolence 06/04/2020  . Encounter for post surgical wound check 01/29/2020  . 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  . Mixed hyperlipidemia 09/17/2019  . Supraventricular tachycardia ([place]) 09/17/2019  . Chronic chest wall pain 08/28/2015  . 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 Numerous moles 01/19/2012  . Hypertension 03/17/2011  . CHEST PAIN 05/06/2010  . BACK PAIN, LUMBAR 08/06/2009    Past Surgical History:  Procedure Laterality Date  . APPENDECTOMY    . BACK SURGERY    . HERNIA REPAIR    . LAPAROSCOPIC GASTROTOMY W/ REPAIR OF ULCER    . MASS EXCISION N/A 01/22/2020   Procedure: EXCISION OF  XIPHOID;  Surgeon: Prescott Gum, Collier Salina, MD;  Location: [facility] OR;  Service: Thoracic;  Laterality: N/A;  . right thoracotomy for exposure for the C7-T8 diskectomy  02/06/2007   Dr Arlyce Dice  . TONSILLECTOMY         Family History  Problem Relation Age of Onset  . Hypertension Mother   . Asthma Mother   . Cancer Paternal Uncle        intestinal  . Healthy Sister   . Healthy Brother   . Scoliosis Daughter   . Healthy Son   . Other Brother        tumors    Social History   Tobacco Use  . Smoking status: Former Smoker    Types: Cigarettes    Quit date: 05/23/1997    Years since quitting:  23.1  . Smokeless tobacco: Current User    Types: Chew  Vaping Use  . Vaping Use: Never used  Substance Use Topics  . Alcohol use: Yes    Alcohol/week: 6.0 standard drinks    Types: 6 Cans of beer per week    Comment: Once a month  . Drug use: Not Currently    Home Medications Prior to Admission medications   Medication Sig Start Date End Date Taking? Authorizing Provider  atorvastatin (LIPITOR) 40 MG tablet Take 1 tablet (40 mg total) by mouth daily. 09/17/19   Lillard Anes, MD  diclofenac Sodium (VOLTAREN) 1 % GEL Apply 2 g topically 4 (four) times daily as needed (arthritis pain.). 06/16/20   Jessy Oto, MD  diltiazem [facility]) 180 MG 24 hr capsule Take 1 capsule by mouth once daily 06/30/20   [name], FNP  gabapentin (NEURONTIN) 300 MG capsule Take 1 capsule (300 mg total) by mouth 3 (three) times daily. 06/16/20   Jessy Oto, MD  metoprolol tartrate (LOPRESSOR) 50 MG tablet Take 50 mg by mouth 2 (two) times daily. 03/31/20   [provider]  Gabapentin, PHN, 300 MG TABS Take 1 capsule by mouth 3 (three) times daily. 01/20/11 02/15/12  Dorena Cookey, MD    Allergies    Patient has no known allergies.  Review of Systems   Review of Systems  Genitourinary: Positive for hematuria.  All other systems reviewed and are negative.   Physical Exam Updated Vital Signs BP (!) 154/101 (BP Location: Left Arm)   Pulse 80   Temp 97.7 F (36.5 C) (Oral)   Resp (!) 24   Ht 5\' 10"  (1.778 m)   Wt 93.9 kg   SpO2 100%   BMI 29.70 kg/m   Physical Exam Vitals and nursing note reviewed.  Constitutional:      General: He is not in acute distress.    Appearance: He is well-developed and well-nourished.     Comments: Appears uncomfortable  HENT:     Head: Normocephalic and atraumatic.     Mouth/Throat:     Mouth: Oropharynx is clear and moist.  Eyes:     Extraocular Movements: EOM normal.     Conjunctiva/sclera: Conjunctivae normal.     Pupils:  Pupils are equal, round, and reactive to light.  Cardiovascular:     Rate and Rhythm: Normal rate and regular rhythm.     Pulses: Intact distal pulses.     Heart sounds: No murmur heard.   Pulmonary:     Effort: Pulmonary effort is normal. No respiratory distress.     Breath sounds: Normal breath sounds. No wheezing or rales.  Abdominal:     General: There is no distension.     Palpations: Abdomen is soft.     Tenderness: There is no abdominal tenderness. There is right CVA tenderness. There is no guarding or rebound.  Musculoskeletal:        General: No tenderness or edema. Normal range of motion.     Cervical back: Normal range of motion and neck supple.  Skin:    General: Skin is warm and dry.     Findings: No erythema or rash.  Neurological:     Mental Status: He is alert and oriented to person, place, and time. Mental status is at baseline.     Comments: Able to ambulate but does limp on the left leg  Psychiatric:        Mood and Affect: Mood and affect and mood normal.        Behavior: Behavior normal.        Thought Content: Thought content normal.     ED Results / Procedures / Treatments   Labs (all labs ordered are listed, but only abnormal results are displayed) Labs Reviewed  COMPREHENSIVE METABOLIC PANEL - Abnormal; Notable for the following components:      Result Value   Potassium 3.4 (*)    Glucose, Bld 124 (*)    Total Bilirubin 1.6 (*)    All other components within normal limits  URINE CULTURE  CBC WITH DIFFERENTIAL/PLATELET  URINALYSIS, ROUTINE W REFLEX MICROSCOPIC    EKG None  Radiology CT Renal Stone Study  Result Date: 07/15/2020 CLINICAL DATA:  Gross hematuria.  History of kidney stones. EXAM: CT ABDOMEN AND PELVIS WITHOUT CONTRAST TECHNIQUE: Multidetector CT imaging of the abdomen and pelvis was performed following the standard protocol without IV contrast. COMPARISON:  October 24, 2015. FINDINGS: Lower chest: No acute abnormality. Hepatobiliary:  No focal liver abnormality is seen. No gallstones, gallbladder wall thickening, or biliary dilatation. Pancreas: Unremarkable. No pancreatic ductal dilatation or surrounding inflammatory changes. Spleen: Normal in size without focal abnormality. Adrenals/Urinary Tract: Adrenal glands appear normal. Probable bilateral renal cysts are noted. Right renal cortical calcifications are noted. Mild right hydroureteronephrosis is noted secondary to 8 mm calculus in distal right ureter. Urinary bladder is unremarkable. Stomach/Bowel: The stomach appears normal. There is no evidence of bowel obstruction or inflammation. Status post appendectomy. Vascular/Lymphatic: No significant vascular findings are present. No enlarged abdominal or pelvic lymph nodes. Reproductive: Prostate is unremarkable. Other: No abdominal wall hernia or abnormality. No abdominopelvic ascites. Musculoskeletal: No acute or significant osseous findings. IMPRESSION: Mild right hydroureteronephrosis is noted secondary to 8 mm distal right ureteral calculus. Electronically Signed   By: Marijo Conception M.D.   On: 07/15/2020 08:27    Procedures Procedures   Medications Ordered in ED Medications  HYDROmorphone (DILAUDID) injection 1 mg (has no administration in time range)  ondansetron (ZOFRAN) injection 4 mg (has no administration in time range)    ED Course  I have reviewed the triage vital signs and the nursing notes.  Pertinent labs & imaging results that were available during my care of the patient were reviewed by me and considered in my medical decision making (see chart for details).    MDM Rules/Calculators/A&P                          Pt with symptoms consistent with kidney stone with right flank pain radiating to testicles and blood urine.  Some pain 3-4 days ago  that resolved and then returned this morning at 3am.  Denies infectious sx, or GI symptoms.  Low concern for diverticulitis and AAA.  No hx suggestive of GU source  (discharge).  Pt does report hx of renal stones but also hx of renal cysts and severe back pain and issues waiting for surgery.  Will treat pain and ensure no infection with UA, CBC, CMP and will get stone study to further eval.  8:56 AM CT showing mild right hydroureteronephrosis secondary to an 8 mm distal right ureteral calculus.  CBC and CMP are within normal limits.  Urine is still pending.  Will discuss with urology as he was waiting for a call from them for the last 2 weeks anyway for follow-up on cysts.  On reevaluation patient reports the pain has improved some but he is still having significant pain.  He will require Flomax.  9:15 AM Spoke with urology who can see the patient later today.  They were ok with giving toradol.   10:23 AM Pain improved.  appt with urology later today.  MDM Number of Diagnoses or Management Options   Amount and/or Complexity of Data Reviewed Clinical lab tests: ordered and reviewed Tests in the radiology section of CPT: ordered and reviewed Decide to obtain previous medical records or to obtain history from someone other than the patient: yes Obtain history from someone other than the patient: yes Review and summarize past medical records: yes Discuss the patient with other providers: yes Independent visualization of images, tracings, or specimens: yes  Risk of Complications, Morbidity, and/or Mortality Presenting problems: high Diagnostic procedures: moderate Management options: moderate  Patient Progress Patient progress: improved   Final Clinical Impression(s) / ED Diagnoses Final diagnoses:  Right renal stone    Rx / DC Orders ED Discharge Orders         Ordered    oxyCODONE-acetaminophen (PERCOCET/ROXICET) 5-325 MG tablet  Every 6 hours PRN        07/15/20 1025    tamsulosin (FLOMAX) 0.4 MG CAPS capsule  Daily after supper        07/15/20 1025           Blanchie Dessert, MD 07/15/20 1025

## 2020-07-15 NOTE — ED Notes (Signed)
ED Provider at bedside. 

## 2020-07-16 ENCOUNTER — Inpatient Hospital Stay (HOSPITAL_COMMUNITY): Payer: 59 | Admitting: Anesthesiology

## 2020-07-16 ENCOUNTER — Inpatient Hospital Stay (HOSPITAL_COMMUNITY): Payer: 59

## 2020-07-16 ENCOUNTER — Other Ambulatory Visit: Payer: Self-pay | Admitting: Urology

## 2020-07-16 ENCOUNTER — Encounter (HOSPITAL_COMMUNITY): Payer: Self-pay | Admitting: Urology

## 2020-07-16 ENCOUNTER — Ambulatory Visit (HOSPITAL_COMMUNITY)
Admission: RE | Admit: 2020-07-16 | Discharge: 2020-07-16 | Disposition: A | Discharge status: Home / Self Care | Payer: 59 | Source: Other Acute Inpatient Hospital | Attending: Urology | Admitting: Urology

## 2020-07-16 ENCOUNTER — Encounter (HOSPITAL_COMMUNITY)
Admission: RE | Disposition: A | Discharge status: Home / Self Care | Payer: Self-pay | Source: Other Acute Inpatient Hospital | Attending: Urology

## 2020-07-16 ENCOUNTER — Other Ambulatory Visit: Payer: Self-pay

## 2020-07-16 DIAGNOSIS — Z87442 Personal history of urinary calculi: Secondary | ICD-10-CM | POA: Insufficient documentation

## 2020-07-16 DIAGNOSIS — Z87891 Personal history of nicotine dependence: Secondary | ICD-10-CM | POA: Insufficient documentation

## 2020-07-16 DIAGNOSIS — Z79899 Other long term (current) drug therapy: Secondary | ICD-10-CM | POA: Insufficient documentation

## 2020-07-16 DIAGNOSIS — Z20822 Contact with and (suspected) exposure to covid-19: Secondary | ICD-10-CM | POA: Insufficient documentation

## 2020-07-16 DIAGNOSIS — Z79891 Long term (current) use of opiate analgesic: Secondary | ICD-10-CM | POA: Insufficient documentation

## 2020-07-16 DIAGNOSIS — N132 Hydronephrosis with renal and ureteral calculous obstruction: Secondary | ICD-10-CM | POA: Insufficient documentation

## 2020-07-16 HISTORY — PX: CYSTOSCOPY/URETEROSCOPY/HOLMIUM LASER/STENT PLACEMENT: SHX6546

## 2020-07-16 LAB — SARS CORONAVIRUS 2 BY RT PCR (HOSPITAL ORDER, PERFORMED IN ~~LOC~~ HOSPITAL LAB): SARS Coronavirus 2: NEGATIVE

## 2020-07-16 SURGERY — CYSTOSCOPY/URETEROSCOPY/HOLMIUM LASER/STENT PLACEMENT
Anesthesia: General | Site: Penis | Laterality: Right

## 2020-07-16 MED ORDER — FENTANYL CITRATE (PF) 100 MCG/2ML IJ SOLN
25.0000 ug | INTRAMUSCULAR | Status: DC | PRN
  Administered 2020-07-16: 25 ug via INTRAVENOUS

## 2020-07-16 MED ORDER — IOHEXOL 300 MG/ML  SOLN
INTRAMUSCULAR | Status: DC | PRN
  Administered 2020-07-16: 13 mL

## 2020-07-16 MED ORDER — ONDANSETRON HCL 4 MG/2ML IJ SOLN
INTRAMUSCULAR | Status: AC
  Filled 2020-07-16: qty 2

## 2020-07-16 MED ORDER — FENTANYL CITRATE (PF) 250 MCG/5ML IJ SOLN
INTRAMUSCULAR | Status: AC
  Filled 2020-07-16: qty 5

## 2020-07-16 MED ORDER — OXYCODONE HCL 5 MG PO TABS
5.0000 mg | ORAL_TABLET | Freq: Once | ORAL | Status: DC | PRN
Start: 2020-07-16 — End: 2020-07-16

## 2020-07-16 MED ORDER — MIDAZOLAM HCL 2 MG/2ML IJ SOLN
INTRAMUSCULAR | Status: AC
  Filled 2020-07-16: qty 2

## 2020-07-16 MED ORDER — DEXAMETHASONE SODIUM PHOSPHATE 10 MG/ML IJ SOLN
INTRAMUSCULAR | Status: AC
  Filled 2020-07-16: qty 1

## 2020-07-16 MED ORDER — LACTATED RINGERS IV SOLN: INTRAVENOUS | Status: DC | PRN

## 2020-07-16 MED ORDER — CHLORHEXIDINE GLUCONATE 0.12 % MT SOLN
15.0000 mL | OROMUCOSAL | Status: AC
  Administered 2020-07-16: 15 mL via OROMUCOSAL

## 2020-07-16 MED ORDER — DEXMEDETOMIDINE HCL 200 MCG/2ML IV SOLN
INTRAVENOUS | Status: DC | PRN
  Administered 2020-07-16: 4 ug via INTRAVENOUS
  Administered 2020-07-16: 8 ug via INTRAVENOUS

## 2020-07-16 MED ORDER — DEXMEDETOMIDINE (PRECEDEX) IN NS 20 MCG/5ML (4 MCG/ML) IV SYRINGE
PREFILLED_SYRINGE | INTRAVENOUS | Status: AC
  Filled 2020-07-16: qty 5

## 2020-07-16 MED ORDER — STERILE WATER FOR IRRIGATION IR SOLN
Status: DC | PRN
  Administered 2020-07-16: 3000 mL via INTRAVESICAL

## 2020-07-16 MED ORDER — FENTANYL CITRATE (PF) 100 MCG/2ML IJ SOLN
INTRAMUSCULAR | Status: AC
  Filled 2020-07-16: qty 2

## 2020-07-16 MED ORDER — PROMETHAZINE HCL 25 MG/ML IJ SOLN: 6.2500 mg | INTRAMUSCULAR | Status: DC | PRN

## 2020-07-16 MED ORDER — LACTATED RINGERS IV SOLN: Freq: Once | INTRAVENOUS | Status: AC

## 2020-07-16 MED ORDER — LIDOCAINE HCL (PF) 2 % IJ SOLN
INTRAMUSCULAR | Status: AC
  Filled 2020-07-16: qty 5

## 2020-07-16 MED ORDER — PROPOFOL 10 MG/ML IV BOLUS
INTRAVENOUS | Status: DC | PRN
Start: 2020-07-16 — End: 2020-07-16
  Administered 2020-07-16: 200 mg via INTRAVENOUS

## 2020-07-16 MED ORDER — PROPOFOL 10 MG/ML IV BOLUS
INTRAVENOUS | Status: AC
  Filled 2020-07-16: qty 20

## 2020-07-16 MED ORDER — DEXAMETHASONE SODIUM PHOSPHATE 10 MG/ML IJ SOLN
INTRAMUSCULAR | Status: DC | PRN
  Administered 2020-07-16: 5 mg via INTRAVENOUS

## 2020-07-16 MED ORDER — MIDAZOLAM HCL 5 MG/5ML IJ SOLN
INTRAMUSCULAR | Status: DC | PRN
  Administered 2020-07-16: 2 mg via INTRAVENOUS

## 2020-07-16 MED ORDER — KETOROLAC TROMETHAMINE 30 MG/ML IJ SOLN
30.0000 mg | Freq: Once | INTRAMUSCULAR | Status: AC | PRN
  Administered 2020-07-16: 30 mg via INTRAVENOUS

## 2020-07-16 MED ORDER — ONDANSETRON HCL 4 MG/2ML IJ SOLN
INTRAMUSCULAR | Status: DC | PRN
  Administered 2020-07-16: 4 mg via INTRAVENOUS

## 2020-07-16 MED ORDER — LIDOCAINE 2% (20 MG/ML) 5 ML SYRINGE
INTRAMUSCULAR | Status: DC | PRN
  Administered 2020-07-16: 100 mg via INTRAVENOUS

## 2020-07-16 MED ORDER — OXYCODONE HCL 5 MG/5ML PO SOLN: 5.0000 mg | Freq: Once | ORAL | Status: DC | PRN

## 2020-07-16 MED ORDER — KETOROLAC TROMETHAMINE 30 MG/ML IJ SOLN
INTRAMUSCULAR | Status: AC
  Filled 2020-07-16: qty 1

## 2020-07-16 MED ORDER — FENTANYL CITRATE (PF) 100 MCG/2ML IJ SOLN
INTRAMUSCULAR | Status: DC | PRN
  Administered 2020-07-16 (×2): 50 ug via INTRAVENOUS

## 2020-07-16 MED ORDER — CEFAZOLIN SODIUM-DEXTROSE 2-3 GM-%(50ML) IV SOLR
INTRAVENOUS | Status: DC | PRN
  Administered 2020-07-16: 2 g via INTRAVENOUS

## 2020-07-16 MED ORDER — CEFAZOLIN SODIUM-DEXTROSE 2-4 GM/100ML-% IV SOLN
INTRAVENOUS | Status: AC
  Filled 2020-07-16: qty 100

## 2020-07-16 MED ORDER — EPHEDRINE SULFATE-NACL 50-0.9 MG/10ML-% IV SOSY
PREFILLED_SYRINGE | INTRAVENOUS | Status: DC | PRN
  Administered 2020-07-16: 10 mg via INTRAVENOUS

## 2020-07-16 SURGICAL SUPPLY — 24 items
BAG URO CATCHER STRL LF (MISCELLANEOUS) ×2 IMPLANT
BASKET ZERO TIP NITINOL 2.4FR (BASKET) IMPLANT
BSKT STON RTRVL ZERO TP 2.4FR (BASKET)
BULB IRRIG PATHFIND (MISCELLANEOUS) ×2 IMPLANT
CATH URET 5FR 28IN OPEN ENDED (CATHETERS) IMPLANT
CLOTH BEACON ORANGE TIMEOUT ST (SAFETY) ×2 IMPLANT
EXTRACTOR STONE 1.7FRX115CM (UROLOGICAL SUPPLIES) ×2 IMPLANT
GLOVE SURG ENC TEXT LTX SZ7.5 (GLOVE) ×2 IMPLANT
GOWN STRL REUS W/TWL XL LVL3 (GOWN DISPOSABLE) ×2 IMPLANT
GUIDEWIRE ANG ZIPWIRE 038X150 (WIRE) IMPLANT
GUIDEWIRE STR DUAL SENSOR (WIRE) ×2 IMPLANT
IV NS 1000ML (IV SOLUTION) ×2
IV NS 1000ML BAXH (IV SOLUTION) ×1 IMPLANT
KIT TURNOVER KIT A (KITS) ×2 IMPLANT
LASER FIB FLEXIVA PULSE ID 365 (Laser) IMPLANT
MANIFOLD NEPTUNE II (INSTRUMENTS) ×2 IMPLANT
PACK CYSTO (CUSTOM PROCEDURE TRAY) ×2 IMPLANT
SHEATH URETERAL 12FRX35CM (MISCELLANEOUS) IMPLANT
STENT URET 6FRX26 CONTOUR (STENTS) ×2 IMPLANT
SYR 20ML LL LF (SYRINGE) ×2 IMPLANT
TRACTIP FLEXIVA PULS ID 200XHI (Laser) IMPLANT
TRACTIP FLEXIVA PULSE ID 200 (Laser) ×2 IMPLANT
TUBING CONNECTING 10 (TUBING) ×2 IMPLANT
TUBING UROLOGY SET (TUBING) ×2 IMPLANT

## 2020-07-16 NOTE — Anesthesia Procedure Notes (Signed)
Procedure Name: LMA Insertion Date/Time: 07/16/2020 2:07 PM Performed by: Gwyndolyn Saxon, CRNA Pre-anesthesia Checklist: Patient identified, Emergency Drugs available, Suction available and Patient being monitored Patient Re-evaluated:Patient Re-evaluated prior to induction Oxygen Delivery Method: Circle system utilized Preoxygenation: Pre-oxygenation with 100% oxygen Induction Type: IV induction Ventilation: Mask ventilation without difficulty LMA: LMA inserted LMA Size: 5.0 Number of attempts: 1 Placement Confirmation: positive ETCO2 and breath sounds checked- equal and bilateral Tube secured with: Tape Dental Injury: Teeth and Oropharynx as per pre-operative assessment

## 2020-07-16 NOTE — Transfer of Care (Signed)
Immediate Anesthesia Transfer of Care Note  [name]  Procedure(s) Performed: CYSTOSCOPY/URETEROSCOPY/HOLMIUM LASER/STENT PLACEMENT (Right Penis)  Patient Location: PACU  Anesthesia Type:General  Level of Consciousness: drowsy  Airway & Oxygen Therapy: Patient Spontanous Breathing and Patient connected to face mask oxygen  Post-op Assessment: Report given to RN and Post -op Vital signs reviewed and stable  Post vital signs: Reviewed and stable  Last Vitals:  Vitals Value Taken Time  BP 136/90 07/16/20 1527  Temp    Pulse 81 07/16/20 1528  Resp 14 07/16/20 1528  SpO2 100 % 07/16/20 1528  Vitals shown include unvalidated device data.  Last Pain:  Vitals:   07/16/20 1224  TempSrc: Oral         Complications: No complications documented.

## 2020-07-16 NOTE — Interval H&P Note (Signed)
History and Physical Interval Note:  07/16/2020 2:01 PM  Kevin Jackson  has presented today for surgery, with the diagnosis of Right ureteral calculus.  The various methods of treatment have been discussed with the patient and family. After consideration of risks, benefits and other options for treatment, the patient has consented to  Procedure(s): CYSTOSCOPY/URETEROSCOPY/HOLMIUM LASER/STENT PLACEMENT (Right) as a surgical intervention.  The patient's history has been reviewed, patient examined, no change in status, stable for surgery.  I have reviewed the patient's chart and labs.  Questions were answered to the patient's satisfaction.     [name]

## 2020-07-16 NOTE — H&P (Signed)
Patient is a healthy 50 year old white male presents with 3-4 day history of some right-sided flank pain with nausea and vomiting. He was seen yesterday in the emergency room was found on CT urogram to have a 8 mm right distal ureteral calculus with some hydronephrosis. He has denied any fever but has had some dark-colored urine. Also has continued to have nausea overnight. Current pain level is 7/10. He has had prior history of stones all passed spontaneously in the past. Here for evaluation and management. CT report is as below  Micro urinalysis today shows 40 60 RBCs rare bacteria    CLINICAL DATA: Gross hematuria. History of kidney stones.   EXAM:  CT ABDOMEN AND PELVIS WITHOUT CONTRAST   TECHNIQUE:  Multidetector CT imaging of the abdomen and pelvis was performed  following the standard protocol without IV contrast.   COMPARISON: October 24, 2015.   FINDINGS:  Lower chest: No acute abnormality.   Hepatobiliary: No focal liver abnormality is seen. No gallstones,  gallbladder wall thickening, or biliary dilatation.   Pancreas: Unremarkable. No pancreatic ductal dilatation or  surrounding inflammatory changes.   Spleen: Normal in size without focal abnormality.   Adrenals/Urinary Tract: Adrenal glands appear normal. Probable  bilateral renal cysts are noted. Right renal cortical calcifications  are noted. Mild right hydroureteronephrosis is noted secondary to 8  mm calculus in distal right ureter. Urinary bladder is unremarkable.   Stomach/Bowel: The stomach appears normal. There is no evidence of  bowel obstruction or inflammation. Status post appendectomy.   Vascular/Lymphatic: No significant vascular findings are present. No  enlarged abdominal or pelvic lymph nodes.   Reproductive: Prostate is unremarkable.   Other: No abdominal wall hernia or abnormality. No abdominopelvic  ascites.   Musculoskeletal: No acute or significant osseous findings.   IMPRESSION:  Mild right  hydroureteronephrosis is noted secondary to 8 mm distal  right ureteral calculus.    Electronically Signed  By: Marijo Conception M.D.  On: 07/15/2020 08:27     ALLERGIES: No Allergies    MEDICATIONS: Tamsulosin Hcl  Atorvastatin Calcium  Diltiazem 12Hr Er  Gabapentin  Keflex TABS Oral  Oxycodone Hcl  Vicodin TABS Oral     GU PSH: Hydrocele repair - 2011       PSH Notes: Surgery [facility] Excision Of Hydrocele Right, Back Surgery   NON-GU PSH: Back surgery     GU PMH: Hydrocele, Unspec, Hydrocele of testis - 2014      PMH Notes:  1898-05-23 00:00:00 - Note: Normal Routine History And Physical Adult  2009-04-30 15:56:21 - Note: No Medical Problems   NON-GU PMH: Anxiety Arthritis Depression GERD Hypercholesterolemia Hypertension    FAMILY HISTORY: 2 daughters - Daughter 2 sons - Son Colon Cancer - Grandfather Hypertension - Mother   SOCIAL HISTORY: Marital Status: Divorced Preferred Language: English; Ethnicity: Not Hispanic Or Latino; Race: White Current Smoking Status: Patient has never smoked.   Tobacco Use Assessment Completed: Used Tobacco in last 30 days? Does not use smokeless tobacco. Social Drinker.  Does not use drugs. Drinks 3 caffeinated drinks per day. Has not had a blood transfusion.     Notes: Tobacco use, Occupation:, Marital History - Divorced, Caffeine Use, Alcohol Use   REVIEW OF SYSTEMS:    GU Review Male:   Patient reports frequent urination, burning/ pain with urination, get up at night to urinate, and erection problems. Patient denies hard to postpone urination, leakage of urine, stream starts and stops, trouble starting your stream, have to  strain to urinate , and penile pain.  Gastrointestinal (Upper):   Patient denies nausea, vomiting, and indigestion/ heartburn.  Gastrointestinal (Lower):   Patient denies diarrhea and constipation.  Constitutional:   Patient denies fever, night sweats, weight loss, and fatigue.  Skin:    Patient denies skin rash/ lesion and itching.  Eyes:   Patient denies blurred vision and double vision.  Ears/ Nose/ Throat:   Patient denies sore throat and sinus problems.  Hematologic/Lymphatic:   Patient denies swollen glands and easy bruising.  Cardiovascular:   Patient denies leg swelling and chest pains.  Respiratory:   Patient denies cough and shortness of breath.  Endocrine:   Patient denies excessive thirst.  Musculoskeletal:   Patient denies back pain and joint pain.  Neurological:   Patient denies headaches and dizziness.  Psychologic:   Patient denies depression and anxiety.   Notes: hematuria    VITAL SIGNS:      07/16/2020 08:54 AM  Weight 210 lb / 95.25 kg  Height 70 in / 177.8 cm  BP 130/90 mmHg  Pulse 93 /min  Temperature 98.9 F / 37.1 C  BMI 30.1 kg/m   MULTI-SYSTEM PHYSICAL EXAMINATION:    Constitutional: Well-nourished. No physical deformities. Normally developed. Good grooming.  Neck: Neck symmetrical, not swollen. Normal tracheal position.  Respiratory: No labored breathing, no use of accessory muscles.   Cardiovascular: Normal temperature, normal extremity pulses, no swelling, no varicosities.  Lymphatic: No enlargement of neck, axillae, groin.  Skin: No paleness, no jaundice, no cyanosis. No lesion, no ulcer, no rash.  Neurologic / Psychiatric: Oriented to time, oriented to place, oriented to person. No depression, no anxiety, no agitation.  Gastrointestinal: No mass, no tenderness, no rigidity, non obese abdomen. Patient has right-sided CVA tenderness  Eyes: Normal conjunctivae. Normal eyelids.  Ears, Nose, Mouth, and Throat: Left ear no scars, no lesions, no masses. Right ear no scars, no lesions, no masses. Nose no scars, no lesions, no masses. Normal hearing. Normal lips.  Musculoskeletal: Normal gait and station of head and neck.     Complexity of Data:  Source Of History:  Patient  Records Review:   Previous Doctor Records, Previous Hospital  Records, Previous Patient Records  Urine Test Review:   Urinalysis  X-Ray Review: C.T. Abdomen/Pelvis: Reviewed Films. Reviewed Report. Discussed With Patient.     PROCEDURES:          Urinalysis w/Scope Dipstick Dipstick Cont'd Micro  Color: Yellow Bilirubin: Neg mg/dL WBC/hpf: 0 - 5/hpf  Appearance: Cloudy Ketones: Neg mg/dL RBC/hpf: 40 - 60/hpf  Specific Gravity: 1.020 Blood: 3+ ery/uL Bacteria: Rare (0-9/hpf)  pH: 6.0 Protein: Neg mg/dL Cystals: NS (Not Seen)  Glucose: Neg mg/dL Urobilinogen: 0.2 mg/dL Casts: NS (Not Seen)    Nitrites: Neg Trichomonas: Not Present    Leukocyte Esterase: Neg leu/uL Mucous: Not Present      Epithelial Cells: NS (Not Seen)      Yeast: NS (Not Seen)      Sperm: Not Present         Ketoralac 60mg  - [id number], [id number] Qty: 60 Adm. By: Alona Bene  Unit: mg Lot No [id number]  Route: IM Exp. Date 06/22/2021  Freq: None Mfgr.:   Site: Right Buttock   ASSESSMENT:      ICD-10 Details  1 GU:   Ureteral calculus - M22.6 Acute, Complicated Injury   PLAN:           Document Letter(s):  Created for Patient: Clinical Summary  Notes:   We discussed treatment options for his right distal ureteral calculus. As he has continued to be markedly symptomatic with nausea will recommend cysto and right ureteroscopy with laser lithotripsy and likely insertion of right JJ stent. I told him sometimes we cannot get up to the stone and in that case would just place stent and then delays ureteroscopy for approximately 1 week. Patient understand risks of the procedure as outlined below and desires to proceed  I have recommended retrograde pyelogram, ureteroscopic stone manipulation with laser lithotripsy. I have discussed in detail the risks, benefits and alternatives of ureteroscopic stone extraction to include but not limited to: Bleeding, infection, ureteral perforation with need for open repair, inability to place the stent necessitating the need for further procedures,  possible percutaneous nephrostomy tube placement, discomfort from the stents, hematuria, urgency, frequency and refractory problems after the stent is removed. I discussed the stent is not a permanent stent and will require a followup for stent removal or stent exchange. The patient knows there is high risk for ureteral stent incrustation if this is not removed or exchanged within 3 months. Patient voices understanding of the risks and benefits of the procedure and consents to the procedure.

## 2020-07-16 NOTE — Op Note (Signed)
Preoperative diagnosis:  1.  Right distal ureteral calculus  Postoperative diagnosis: 1.  Same  Procedure(s): 1.  Cystoscopy, right retrograde pyelogram with intraoperative interpretation, right ureteroscopy with laser lithotripsy, stone extraction and insertion of right JJ stent  Surgeon: Dr. Harold Barban  Anesthesia: General  Complications: None  EBL: Minimal  Specimens: Kidney stone  Disposition of specimens: Stones given to patient  Intraoperative findings: 7 mm right distal ureteral calculus approximately 1 to 2 cm proximal to the ureterovesical junction.  This was fragmented utilizing holmium laser and completely extracted.  6 [place] by 26 cm JJ stent placed  Indication: Patient is a 50 year old white male with acute onset right flank pain found to have a 7 mm right distal ureteral calculus.  Presents this time to go cystoscopy ureteroscopy with laser lithotripsy and stone extraction.  Description of procedure:  After obtaining form consent for the patient is taken the major cystoscopy suite placed under general anesthesia.  Placed in the dorsolithotomy position genitalia prepped draped in usual sterile fashion.  Proper pause timeout was performed prior to the start of procedure.  59 [place]ans[place] was advanced under direct vision into the bladder.  Bladder appeared grossly normal.  Ureteral orifice was somewhat edematous on the right.  A retrograde pyelogram was performed with a 5 French open tip catheter inside the right ureteral orifice and a left distal ureteral filling defect was seen consistent with a stone seen on preoperative CT scan.  A sensor wire was passed through the operative catheter manipulated up to the renal pelvis without difficulty.  The scope was removed.  The 6.4 French semirigid ureteroscope was then advanced under direct vision over the wire and inside the right ureteral orifice to the level of the stone.  The wire was removed.  The 200 m holmium laser fiber was  then utilized to fragment the stone into numerous small pieces.  An engage basket was then utilized to extract the fragments with several passes of the ureteroscope.  Final look in the ureter revealed no remaining fragments.  Retrograde pyelogram through the ureteroscope revealed good integrity of the ureter without evidence of extravasation and no proximal filling defects.  Guidewire was passed through the ureteroscope and advanced up the renal pelvis.  The scope was removed.  Wire was then back fed through the cystoscope and a 6 [place] by 26 cm JJ stent was placed leaving a proximal coil in the renal pelvis and a distal coil in the bladder.  There was good flow of urine through and around the stent noted.  A nylon suture was left on the distal end of the stent and left protruding to the urethral meatus to facilitate future removal.  Stone fragments were irrigated from the bladder prior to removing the scope.  Procedure was terminated he was awake from anesthesia and taken back to the recovery room in stable condition.  No immediate complication from the procedure.

## 2020-07-16 NOTE — Anesthesia Preprocedure Evaluation (Signed)
Anesthesia Evaluation  Patient identified by MRN, date of birth, ID band Patient awake    Reviewed: Allergy & Precautions, H&P , NPO status , Patient's Chart, lab work & pertinent test results  Airway Mallampati: II  TM Distance: >3 FB Neck ROM: Full    Dental no notable dental hx.    Pulmonary neg pulmonary ROS, former smoker,    Pulmonary exam normal breath sounds clear to auscultation       Cardiovascular hypertension, Pt. on medications and Pt. on home beta blockers Normal cardiovascular exam Rhythm:Regular Rate:Normal  H/O SVT in past. Controlled with beta blocker   Neuro/Psych negative neurological ROS  negative psych ROS   GI/Hepatic negative GI ROS, Neg liver ROS,   Endo/Other  negative endocrine ROS  Renal/GU negative Renal ROS  negative genitourinary   Musculoskeletal negative musculoskeletal ROS (+)   Abdominal   Peds negative pediatric ROS (+)  Hematology negative hematology ROS (+)   Anesthesia Other Findings   Reproductive/Obstetrics negative OB ROS                             Anesthesia Physical Anesthesia Plan  ASA: II  Anesthesia Plan: General   Post-op Pain Management:    Induction: Intravenous  PONV Risk Score and Plan: 2 and Ondansetron, Dexamethasone and Treatment may vary due to age or medical condition  Airway Management Planned: LMA  Additional Equipment:   Intra-op Plan:   Post-operative Plan: Extubation in OR  Informed Consent: I have reviewed the patients History and Physical, chart, labs and discussed the procedure including the risks, benefits and alternatives for the proposed anesthesia with the patient or authorized representative who has indicated his/her understanding and acceptance.     Dental advisory given  Plan Discussed with: CRNA and Surgeon  Anesthesia Plan Comments:         Anesthesia Quick Evaluation

## 2020-07-17 ENCOUNTER — Encounter (HOSPITAL_COMMUNITY): Payer: Self-pay | Admitting: Urology

## 2020-07-17 NOTE — Anesthesia Postprocedure Evaluation (Signed)
Anesthesia Post Note  [name]  Procedure(s) Performed: CYSTOSCOPY/URETEROSCOPY/HOLMIUM LASER/STENT PLACEMENT (Right Penis)     Patient location during evaluation: PACU Anesthesia Type: General Level of consciousness: awake and alert Pain management: pain level controlled Vital Signs Assessment: post-procedure vital signs reviewed and stable Respiratory status: spontaneous breathing, nonlabored ventilation, respiratory function stable and patient connected to nasal cannula oxygen Cardiovascular status: blood pressure returned to baseline and stable Postop Assessment: no apparent nausea or vomiting Anesthetic complications: no   No complications documented.  Last Vitals:  Vitals:   07/16/20 1609 07/16/20 1630  BP: (!) 144/96 134/74  Pulse: 75 72  Resp: 16 16  Temp: (!) 36.4 C   SpO2: 92% 93%    Last Pain:  Vitals:   07/16/20 1630  TempSrc:   PainSc: 4                  Alante Tolan S

## 2020-07-20 NOTE — Progress Notes (Deleted)
Cardiology Office Note:    Date:  07/20/2020   ID:  Kevin Jackson, DOB 10-Jun-1970, MRN [id number]  PCP:  [name], FNP  Cardiologist:  Berniece Salines, DO  Electrophysiologist:  None   Referring MD: [name],*   No chief complaint on file.   History of Present Illness:    Kevin Jackson is a 50 y.o. male with a hx of SVT, hypertension, hyperlipidemia who presents for follow-up.  He was initially seen by Dr. Harriet Masson for evaluation of palpitations in April 2021.  Zio patch x14 days was done, which showed 3 runs of SVT, longest lasting 14 seconds, as well as symptomatic PVCs.  He was started on Cardizem 120 mg daily with improvement in his palpitations.  Reports that he has continued to have palpitations and last week his diltiazem dose was increased to 180 mg, with improvement in his symptoms.  He denies any lightheadedness or syncope.  Reports palpitations typically last for few seconds and then resolve.  He does not exercise.  States that most activity he does is yard work and playing with his kids.  Denies any exertional symptoms.  Reports rare episodes of left-sided chest pain that last for few seconds and resolved.  Occasional ankle edema.  Drinks coffee 2-3 times per week, denies any correlation with palpitations.  Occasional energy drink.  Drinks alcohol about once per month.  Chewing tobacco but no cigarette use.  No history of heart disease in his immediate family.  Echocardiogram 08/27/2015 showed LVEF 60 to 39%, grade 1 diastolic dysfunction, mild RV dilatation, mild MR.  ETT 08/18/2015 with negative for ischemia.  Echocardiogram 05/13/2020 showed normal biventricular function, no significant valvular disease.  Since last clinic visit,  Past Medical History:  Diagnosis Date  . Anxiety   . BACK PAIN, LUMBAR 08/06/2009   Qualifier: Diagnosis of  By: Regino Schultze CMA (AAMA), Apolonio Schneiders    . CHEST PAIN 05/06/2010   Qualifier: Diagnosis of  By: Sherren Mocha  MD, Jory Ee   . Chronic chest wall pain 08/28/2015  . Depression   . GERD (gastroesophageal reflux disease)   . Hypertension   . Kidney stones   . Mixed hyperlipidemia 09/17/2019  . Neuralgia   . Numerous moles 01/19/2012  . 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 Supraventricular tachycardia ([place]) 09/17/2019    Past Surgical History:  Procedure Laterality Date  . APPENDECTOMY    . BACK SURGERY    . CYSTOSCOPY/URETEROSCOPY/HOLMIUM LASER/STENT PLACEMENT Right 07/16/2020   Procedure: CYSTOSCOPY/URETEROSCOPY/HOLMIUM LASER/STENT PLACEMENT;  Surgeon: [name], MD;  Location: [facility] ORS;  Service: Urology;  Laterality: Right;  . HERNIA REPAIR    . LAPAROSCOPIC GASTROTOMY W/ REPAIR OF ULCER    . MASS EXCISION N/A 01/22/2020   Procedure: EXCISION OF XIPHOID;  Surgeon: Ivin Poot, MD;  Location: [facility] OR;  Service: Thoracic;  Laterality: N/A;  . right thoracotomy for exposure for the C7-T8 diskectomy  02/06/2007   Dr Arlyce Dice  . TONSILLECTOMY      Current Medications: No outpatient medications have been marked as taking for the 07/21/20 encounter (Appointment) with Donato Heinz, MD.     Allergies:   Patient has no known allergies.   Social History   Socioeconomic History  . Marital status: Married    Spouse name: Not on file  . Number of children: Not on file  . Years of education: Not on file  . Highest education level: Not on file  Occupational History    Employer:  [employer]  Tobacco Use  . Smoking status: Former Smoker    Types: Cigarettes    Quit date: 05/23/1997    Years since quitting: 23.1  . Smokeless tobacco: Current User    Types: Chew  Vaping Use  . Vaping Use: Never used  Substance and Sexual Activity  . Alcohol use: Yes    Alcohol/week: 6.0 standard drinks    Types: 6 Cans of beer per week    Comment: Once a month  . Drug use: Not Currently  . Sexual activity: Yes  Other Topics Concern  . Not on file  Social History Narrative  . Not on file    Social Determinants of Health   Financial Resource Strain: Not on file  Food Insecurity: Not on file  Transportation Needs: Not on file  Physical Activity: Not on file  Stress: Not on file  Social Connections: Not on file     Family History: The patient's  family history includes Asthma in his mother; Cancer in his paternal uncle; Healthy in his brother, sister, and son; Hypertension in his mother; Other in his brother; Scoliosis in his daughter.  ROS:   Please see the history of present illness.     All other systems reviewed and are negative.  EKGs/Labs/Other Studies Reviewed:    The following studies were reviewed today:   EKG:  EKG is  ordered today.  The ekg ordered today demonstrates normal sinus rhythm, rate 74, no PACs/PVCs  Recent Labs: 07/15/2020: ALT 37; BUN 12; Creatinine, Ser 1.15; Hemoglobin 16.9; Platelets 273; Potassium 3.4; Sodium 139  Recent Lipid Panel    Component Value Date/Time   CHOL 123 07/03/2020 1538   CHOL 139 09/17/2019 0927   TRIG 114.0 07/03/2020 1538   HDL 41.30 07/03/2020 1538   HDL 48 09/17/2019 0927   CHOLHDL 3 07/03/2020 1538   VLDL 22.8 07/03/2020 1538   LDLCALC 59 07/03/2020 1538   LDLCALC 73 09/17/2019 0927    Physical Exam:    VS:  There were no vitals taken for this visit.    Wt Readings from Last 3 Encounters:  07/16/20 208 lb (94.3 kg)  07/15/20 207 lb (93.9 kg)  07/03/20 206 lb (93.4 kg)     GEN:  Well nourished, well developed in no acute distress HEENT: Normal NECK: No JVD; No carotid bruits CARDIAC: RRR, no murmurs, rubs, gallops RESPIRATORY:  Clear to auscultation without rales, wheezing or rhonchi  ABDOMEN: Soft, non-tender, non-distended MUSCULOSKELETAL:  No edema; No deformity  SKIN: Warm and dry NEUROLOGIC:  Alert and oriented x 3 PSYCHIATRIC:  Normal affect   ASSESSMENT:    No diagnosis found. PLAN:    Palpitations: Zio patch showed short episodes of SVT, up to 14 seconds.  Appears to be  symptomatic from PVCs.  Reports symptoms have improved with diltiazem.  Suspect untreated sleep apnea may be contributing to arrhythmias.  Echocardiogram on 05/13/2020 shows no structural heart disease. -Continue diltiazem 180 mg daily -Will check echocardiogram to evaluate for structural heart disease  Hyperlipidemia: On atorvastatin 40 mg daily.  LDL 73 on 09/17/2019.  Daytime somnolence: Sleep study negative for OSA 06/04/2020  Tobacco use: Uses chewing tobacco.  Cessation strongly recommended.  RTC in ***  Medication Adjustments/Labs and Tests Ordered: Current medicines are reviewed at length with the patient today.  Concerns regarding medicines are outlined above.  No orders of the defined types were placed in this encounter.  No orders of the defined types were placed in this  encounter.   There are no Patient Instructions on file for this visit.   Signed, Donato Heinz, MD  07/20/2020 10:42 PM    [place]

## 2020-07-21 ENCOUNTER — Ambulatory Visit: Payer: 59 | Admitting: Cardiology

## 2020-07-21 ENCOUNTER — Encounter: Payer: Self-pay | Admitting: Family

## 2020-07-24 ENCOUNTER — Encounter: Payer: Self-pay | Admitting: Family

## 2020-07-25 ENCOUNTER — Encounter: Payer: Self-pay | Admitting: Family

## 2020-07-27 ENCOUNTER — Other Ambulatory Visit: Payer: Self-pay | Admitting: Family

## 2020-07-27 MED ORDER — ATORVASTATIN CALCIUM 40 MG PO TABS: 40.0000 mg | ORAL_TABLET | Freq: Every day | ORAL | 3 refills | Status: DC

## 2020-08-25 DIAGNOSIS — M4726 Other spondylosis with radiculopathy, lumbar region: Secondary | ICD-10-CM | POA: Insufficient documentation

## 2020-09-10 ENCOUNTER — Encounter: Payer: Self-pay | Admitting: Family

## 2020-10-07 ENCOUNTER — Other Ambulatory Visit: Payer: Self-pay | Admitting: Family

## 2020-11-09 ENCOUNTER — Other Ambulatory Visit: Payer: Self-pay | Admitting: Legal Medicine

## 2020-11-09 ENCOUNTER — Telehealth: Payer: Self-pay

## 2020-11-09 ENCOUNTER — Other Ambulatory Visit: Payer: Self-pay | Admitting: Orthopedic Surgery

## 2020-11-09 DIAGNOSIS — M5106 Intervertebral disc disorders with myelopathy, lumbar region: Secondary | ICD-10-CM

## 2020-11-09 NOTE — Telephone Encounter (Signed)
Phone call to patient to verify medication list and allergies for myelogram procedure. Pt is not on any medications that would need to be held for this procedure. Advised pt if any new medications are started prior to procedure to call and make [place] aware. Pt also instructed to have a driver the day of the procedure and discharge instructions. Pt verbalized understanding.

## 2020-11-11 ENCOUNTER — Ambulatory Visit
Admission: RE | Admit: 2020-11-11 | Discharge: 2020-11-11 | Disposition: A | Discharge status: Home / Self Care | Payer: 59 | Source: Ambulatory Visit | Attending: Orthopedic Surgery | Admitting: Orthopedic Surgery

## 2020-11-11 DIAGNOSIS — M5106 Intervertebral disc disorders with myelopathy, lumbar region: Secondary | ICD-10-CM

## 2020-11-11 MED ORDER — IOPAMIDOL (ISOVUE-M 200) INJECTION 41%
20.0000 mL | Freq: Once | INTRAMUSCULAR | Status: AC
  Administered 2020-11-11: 10:00:00 20 mL via INTRATHECAL

## 2020-11-11 MED ORDER — MEPERIDINE HCL 50 MG/ML IJ SOLN
50.0000 mg | Freq: Once | INTRAMUSCULAR | Status: AC
  Administered 2020-11-11: 11:00:00 50 mg via INTRAMUSCULAR

## 2020-11-11 MED ORDER — ONDANSETRON HCL 4 MG/2ML IJ SOLN
4.0000 mg | Freq: Once | INTRAMUSCULAR | Status: AC
  Administered 2020-11-11: 11:00:00 4 mg via INTRAMUSCULAR

## 2020-11-11 MED ORDER — DIAZEPAM 5 MG PO TABS
10.0000 mg | ORAL_TABLET | Freq: Once | ORAL | Status: AC
  Administered 2020-11-11: 09:00:00 10 mg via ORAL

## 2020-11-11 NOTE — Discharge Instr - Other Orders (Signed)
1035: pt reports pain in BLE from myelogram procedure. Pain 8/10. Dr. Jeralyn Ruths aware. See [facility]

## 2020-11-11 NOTE — Discharge Instructions (Signed)

## 2020-11-25 ENCOUNTER — Encounter: Payer: Self-pay | Admitting: Family

## 2020-12-14 DIAGNOSIS — Z736 Limitation of activities due to disability: Secondary | ICD-10-CM

## 2021-01-06 ENCOUNTER — Other Ambulatory Visit: Payer: Self-pay | Admitting: Family

## 2021-03-03 ENCOUNTER — Other Ambulatory Visit: Payer: Self-pay | Admitting: Surgery

## 2021-03-03 DIAGNOSIS — R1900 Intra-abdominal and pelvic swelling, mass and lump, unspecified site: Secondary | ICD-10-CM

## 2021-03-22 ENCOUNTER — Ambulatory Visit
Admission: RE | Admit: 2021-03-22 | Discharge: 2021-03-22 | Disposition: A | Discharge status: Home / Self Care | Payer: 59 | Source: Ambulatory Visit | Attending: Surgery | Admitting: Surgery

## 2021-03-22 DIAGNOSIS — R1900 Intra-abdominal and pelvic swelling, mass and lump, unspecified site: Secondary | ICD-10-CM

## 2021-03-22 MED ORDER — IOPAMIDOL (ISOVUE-300) INJECTION 61%
100.0000 mL | Freq: Once | INTRAVENOUS | Status: AC | PRN
  Administered 2021-03-22: 100 mL via INTRAVENOUS

## 2021-03-29 ENCOUNTER — Encounter: Payer: Self-pay | Admitting: Family

## 2021-03-30 NOTE — Telephone Encounter (Signed)
Recommend scheduling appointment to discuss further

## 2021-04-06 ENCOUNTER — Other Ambulatory Visit: Payer: Self-pay | Admitting: Family

## 2021-04-09 ENCOUNTER — Ambulatory Visit: Payer: Self-pay | Admitting: Surgery

## 2021-04-17 ENCOUNTER — Encounter (HOSPITAL_BASED_OUTPATIENT_CLINIC_OR_DEPARTMENT_OTHER): Payer: Self-pay | Admitting: *Deleted

## 2021-04-17 ENCOUNTER — Emergency Department (HOSPITAL_BASED_OUTPATIENT_CLINIC_OR_DEPARTMENT_OTHER)
Admission: EM | Admit: 2021-04-17 | Discharge: 2021-04-17 | Disposition: A | Discharge status: Home / Self Care | Payer: 59 | Attending: Emergency Medicine | Admitting: Emergency Medicine

## 2021-04-17 ENCOUNTER — Other Ambulatory Visit: Payer: Self-pay

## 2021-04-17 DIAGNOSIS — Z87891 Personal history of nicotine dependence: Secondary | ICD-10-CM | POA: Insufficient documentation

## 2021-04-17 DIAGNOSIS — R202 Paresthesia of skin: Secondary | ICD-10-CM | POA: Diagnosis not present

## 2021-04-17 DIAGNOSIS — I10 Essential (primary) hypertension: Secondary | ICD-10-CM | POA: Insufficient documentation

## 2021-04-17 DIAGNOSIS — M5416 Radiculopathy, lumbar region: Secondary | ICD-10-CM | POA: Diagnosis not present

## 2021-04-17 DIAGNOSIS — Z79899 Other long term (current) drug therapy: Secondary | ICD-10-CM | POA: Diagnosis not present

## 2021-04-17 DIAGNOSIS — M545 Low back pain, unspecified: Secondary | ICD-10-CM | POA: Diagnosis present

## 2021-04-17 MED ORDER — KETOROLAC TROMETHAMINE 30 MG/ML IJ SOLN
30.0000 mg | Freq: Once | INTRAMUSCULAR | Status: AC
  Administered 2021-04-17: 30 mg via INTRAMUSCULAR
  Filled 2021-04-17: qty 1

## 2021-04-17 MED ORDER — DEXAMETHASONE SODIUM PHOSPHATE 10 MG/ML IJ SOLN
10.0000 mg | Freq: Once | INTRAMUSCULAR | Status: AC
  Administered 2021-04-17: 10 mg via INTRAMUSCULAR
  Filled 2021-04-17: qty 1

## 2021-04-17 MED ORDER — PREDNISONE 20 MG PO TABS: ORAL_TABLET | ORAL | 0 refills | Status: DC

## 2021-04-17 MED ORDER — OXYCODONE HCL 5 MG PO TABS
10.0000 mg | ORAL_TABLET | Freq: Once | ORAL | Status: AC
  Administered 2021-04-17: 10 mg via ORAL
  Filled 2021-04-17: qty 2

## 2021-04-17 NOTE — Discharge Instructions (Signed)
Please read and follow all provided instructions.  Your diagnoses today include:  1. Lumbar radiculopathy     Tests performed today include: Vital signs - see below for your results today  Medications prescribed:  Prednisone - steroid medicine   It is best to take this medication in the morning to prevent sleeping problems. If you are diabetic, monitor your blood sugar closely and stop taking Prednisone if blood sugar is over 300. Take with food to prevent stomach upset.   Take any prescribed medications only as directed.  Home care instructions:  Follow any educational materials contained in this packet Please rest, use ice or heat on your back for the next several days Do not lift, push, pull anything more than 10 pounds for the next week  Follow-up instructions: Please follow-up with your primary care provider in the next 1 week for further evaluation of your symptoms.   Return instructions:  SEEK IMMEDIATE MEDICAL ATTENTION IF YOU HAVE: New numbness, tingling, weakness, or problem with the use of your arms or legs Severe back pain not relieved with medications Loss control of your bowels or bladder Increasing pain in any areas of the body (such as chest or abdominal pain) Shortness of breath, dizziness, or fainting.  Worsening nausea (feeling sick to your stomach), vomiting, fever, or sweats Any other emergent concerns regarding your health   Additional Information:  Your vital signs today were: BP (!) 151/89 (BP Location: Right Arm)   Pulse 79   Temp 98.4 F (36.9 C) (Oral)   Resp 20   Ht 5\' 10"  (1.778 m)   Wt 95.3 kg   SpO2 100%   BMI 30.13 kg/m  If your blood pressure (BP) was elevated above 135/85 this visit, please have this repeated by your doctor within one month. --------------

## 2021-04-17 NOTE — ED Triage Notes (Signed)
Pt reports increased back pain x 3 days s/p back surgery 8 months ago. States pain initially was radiating into right leg only but now both legs are affected. Pain increases with BM. Ambulatory to triage with cane

## 2021-04-17 NOTE — ED Provider Notes (Signed)
[facility] EMERGENCY DEPARTMENT Provider Note   CSN: [id number] Arrival date & time: 04/17/21  1202     History Chief Complaint  Patient presents with   Back Pain    Kevin Jackson is a 50 y.o. male.  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  Symptoms started 3 days ago and were minor but progressively worsened.  He states that pain is worse with bearing weight on the right leg, coughing, movements.  It is causing him difficulty sleeping.  He is maintained on chronic hydrocodone and pregabalin.  These have not been helping.  Patient does have chronic saddle paresthesias from previous surgery.  He does report numbness into the right lower leg and foot which is new.  Last urinated at 7 AM.  He states that he does not typically get a normal sensation to void, again due to previous nerve damage. Patient denies warning symptoms of back pain including: fecal incontinence, urinary retention or overflow incontinence, night sweats, unexplained fevers or weight loss, h/o cancer, recent trauma.         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/01/2015   Supraventricular tachycardia ([place]) 09/17/2019    Patient Active Problem List   Diagnosis Date Noted   Daytime somnolence 06/04/2020   Encounter for post surgical wound check 01/29/2020    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   Mixed hyperlipidemia 09/17/2019   Supraventricular tachycardia ([place]) 09/17/2019   Chronic chest wall pain 08/28/2015   Shortened PR interval 08/01/2015   Numerous moles 01/19/2012   Hypertension 03/17/2011   CHEST PAIN 05/06/2010   BACK PAIN, LUMBAR 08/06/2009    Past Surgical History:  Procedure Laterality Date   APPENDECTOMY     BACK SURGERY     CYSTOSCOPY/URETEROSCOPY/HOLMIUM LASER/STENT PLACEMENT Right 07/16/2020   Procedure: CYSTOSCOPY/URETEROSCOPY/HOLMIUM LASER/STENT PLACEMENT;  Surgeon: [name], MD;  Location: [facility] OR[facility];  Service: Urology;  Laterality: Right;   HERNIA REPAIR     LAPAROSCOPIC GASTROTOMY W/ REPAIR OF ULCER     MASS EXCISION N/A 01/22/2020   Procedure: EXCISION OF XIPHOID;  Surgeon: Ivin Poot, MD;  Location: [facility] OR;  Service: Thoracic;  Laterality: N/A;   right thoracotomy for exposure for the C7-T8 diskectomy  02/06/2007   Dr Arlyce Dice   TONSILLECTOMY         Family History  Problem Relation Age of Onset   Hypertension Mother    Asthma Mother    Cancer Paternal Uncle        intestinal   Healthy Sister    Healthy Brother    Scoliosis Daughter    Healthy Son    Other Brother        tumors    Social History   Tobacco Use  Smoking status: Former    Types: Cigarettes    Quit date: 05/23/1997    Years since quitting: 23.9   Smokeless tobacco: Former  Scientific laboratory technician Use: Never used  Substance Use Topics   Alcohol use: Yes    Alcohol/week: 6.0 standard drinks    Types: 6 Cans of beer per week    Comment: Once a month   Drug use: Not Currently    Home Medications Prior to Admission medications   Medication Sig Start Date End Date Taking? Authorizing Provider  atorvastatin (LIPITOR) 40 MG tablet Take 1 tablet (40 mg total) by mouth daily. 07/27/20   [name], FNP  diclofenac Sodium (VOLTAREN) 1 % GEL Apply 2 g topically 4 (four) times daily  as needed (arthritis pain.). Patient not taking: Reported on 11/09/2020 06/16/20   Jessy Oto, MD  diltiazem [facility]) 180 MG 24 hr capsule Take 1 capsule by mouth once daily 04/06/21   [name], FNP  gabapentin (NEURONTIN) 300 MG capsule Take 1 capsule (300 mg total) by mouth 3 (three) times daily. Patient not taking: Reported on 11/09/2020 06/16/20   Jessy Oto, MD  HYDROcodone-acetaminophen [facility]) 10-325 MG tablet Take 1 tablet by mouth every 8 (eight) hours as needed. 10/12/20   [provider]  methocarbamol (ROBAXIN) 500 MG tablet Take 1 tablet by mouth 3 (three) times daily. 09/22/20   [provider]  metoprolol tartrate (LOPRESSOR) 50 MG tablet Take 50 mg by mouth 2 (two) times daily. Patient not taking: Reported on 11/09/2020 03/31/20   [provider]  oxyCODONE-acetaminophen (PERCOCET/ROXICET) 5-325 MG tablet Take 1 tablet by mouth every 6 (six) hours as needed for severe pain. Patient not taking: Reported on 11/09/2020 07/15/20   Blanchie Dessert, MD  pregabalin (LYRICA) 75 MG capsule Take 75 mg by mouth 2 (two) times daily. 10/20/20   [provider]  tamsulosin (FLOMAX) 0.4 MG CAPS capsule Take 1 capsule (0.4 mg total) by mouth daily after supper. Patient not taking: Reported on 11/09/2020 07/15/20   Blanchie Dessert, MD  tamsulosin (FLOMAX) 0.4 MG CAPS capsule TAKE 1 CAPSULE (0.4 MG TOTAL) BY MOUTH DAILY AFTER SUPPER. Patient not taking: Reported on 11/09/2020 07/15/20 07/15/21  Blanchie Dessert, MD  traMADol (ULTRAM) 50 MG tablet Take 50 mg by mouth every 6 (six) hours. Patient not taking: Reported on 11/09/2020 07/20/20   [provider]  Gabapentin, PHN, 300 MG TABS Take 1 capsule by mouth 3 (three) times daily. 01/20/11 02/15/12  Dorena Cookey, MD    Allergies    Bee venom  Review of Systems   Review of Systems  Constitutional:  Negative for fever and unexpected weight change.  Gastrointestinal:  Negative for  constipation.       Neg for fecal incontinence  Genitourinary:  Negative for difficulty urinating, flank pain and hematuria.       Negative for urinary incontinence or retention  Musculoskeletal:  Positive for back pain and myalgias.  Neurological:  Positive for numbness. Negative for weakness.       Negative for saddle paresthesias    Physical Exam Updated Vital Signs BP (!) 151/89 (BP Location: Right Arm)   Pulse 79   Temp 98.4 F (36.9 C) (Oral)   Resp 20   Ht 5\' 10"  (1.778 m)   Wt 95.3 kg   SpO2 100%   BMI 30.13 kg/m   Physical Exam Vitals and nursing note reviewed.  Constitutional:  Appearance: He is well-developed.  HENT:     Head: Normocephalic and atraumatic.  Eyes:     Conjunctiva/sclera: Conjunctivae normal.  Cardiovascular:     Rate and Rhythm: Normal rate.     Pulses:          Dorsalis pedis pulses are 2+ on the right side.  Abdominal:     Palpations: Abdomen is soft.     Tenderness: There is no abdominal tenderness. There is no right CVA tenderness or left CVA tenderness.  Musculoskeletal:        General: Normal range of motion.     Cervical back: Normal range of motion. No tenderness or bony tenderness.     Thoracic back: No tenderness or bony tenderness.     Lumbar back: Tenderness present. No bony tenderness.     Comments: No step-off noted with palpation of spine.   Skin:    General: Skin is warm and dry.  Neurological:     Mental Status: He is alert.     Sensory: No sensory deficit.     Motor: No abnormal muscle tone.     Deep Tendon Reflexes: Reflexes are normal and symmetric.     Comments: 5/5 strength in entire lower extremities bilaterally. Reports decreased sensation lateral R foot.   Psychiatric:        Mood and Affect: Mood normal.    ED Results / Procedures / Treatments   Labs (all labs ordered are listed, but only abnormal results are displayed) Labs Reviewed - No data to display  EKG None  Radiology No results  found.  Procedures Procedures   Medications Ordered in ED Medications  ketorolac (TORADOL) 30 MG/ML injection 30 mg (has no administration in time range)  dexamethasone (DECADRON) injection 10 mg (has no administration in time range)  oxyCODONE (Oxy IR/ROXICODONE) immediate release tablet 10 mg (has no administration in time range)    ED Course  I have reviewed the triage vital signs and the nursing notes.  Pertinent labs & imaging results that were available during my care of the patient were reviewed by me and considered in my medical decision making (see chart for details).  Patient seen and examined. Work-up initiated. Medications ordered.   Vital signs reviewed and are as follows: BP (!) 151/89 (BP Location: Right Arm)   Pulse 79   Temp 98.4 F (36.9 C) (Oral)   Resp 20   Ht 5\' 10"  (1.778 m)   Wt 95.3 kg   SpO2 100%   BMI 30.13 kg/m   4:51 PM patient starting to feel better.  Ready for discharge.  Home with prednisone.  He has urinated.   No red flag s/s of low back pain. Patient was counseled on back pain precautions and told to do activity as tolerated but do not lift, push, or pull heavy objects more than 10 pounds for the next week.  Patient counseled to use ice or heat on back for no longer than 15 minutes every hour.   He may use the muscle relaxer as prescribed.  Patient urged to follow-up with PCP if pain does not improve with treatment and rest or if pain becomes recurrent. Urged to return with worsening severe pain, loss of bowel or bladder control, trouble walking.   The patient verbalizes understanding and agrees with the plan.     MDM Rules/Calculators/A&P  Patient with back pain with radicular features. Chronic neurological deficits with some new numbness in RLE. Patient is ambulatory with cane. No warning symptoms of back pain including: fecal incontinence, urinary retention or overflow incontinence, night sweats, waking from  sleep with back pain, unexplained fevers or weight loss, h/o cancer, IVDU, recent trauma. No concern for cauda equina, epidural abscess, or other serious cause of back pain. Conservative measures such as rest, ice/heat and pain medicine indicated with PCP follow-up if no improvement with conservative management.   Final Clinical Impression(s) / ED Diagnoses Final diagnoses:  Lumbar radiculopathy    Rx / DC Orders ED Discharge Orders          Ordered    predniSONE (DELTASONE) 20 MG tablet        04/17/21 1651             Carlisle Cater, PA-C 04/17/21 1652    Blanchie Dessert, MD 04/22/21 1328

## 2021-05-05 ENCOUNTER — Other Ambulatory Visit: Payer: Self-pay

## 2021-05-05 ENCOUNTER — Encounter (HOSPITAL_BASED_OUTPATIENT_CLINIC_OR_DEPARTMENT_OTHER): Payer: Self-pay | Admitting: Surgery

## 2021-05-07 ENCOUNTER — Encounter (HOSPITAL_BASED_OUTPATIENT_CLINIC_OR_DEPARTMENT_OTHER)
Admission: RE | Admit: 2021-05-07 | Discharge: 2021-05-07 | Disposition: A | Discharge status: Home / Self Care | Payer: 59 | Source: Ambulatory Visit | Attending: Surgery | Admitting: Surgery

## 2021-05-07 DIAGNOSIS — Z01810 Encounter for preprocedural cardiovascular examination: Secondary | ICD-10-CM | POA: Insufficient documentation

## 2021-05-07 MED ORDER — CHLORHEXIDINE GLUCONATE CLOTH 2 % EX PADS: 6.0000 | MEDICATED_PAD | Freq: Once | CUTANEOUS | Status: DC

## 2021-05-07 NOTE — Progress Notes (Signed)
° ° ° ° °  Enhanced Recovery after Surgery for Orthopedics Enhanced Recovery after Surgery is a protocol used to improve the stress on your body and your recovery after surgery.  Patient Instructions  The night before surgery:  No food after midnight. ONLY clear liquids after midnight  The day of surgery (if you do NOT have diabetes):  Drink ONE (1) Pre-Surgery Clear Ensure as directed.   This drink was given to you during your hospital  pre-op appointment visit. The pre-op nurse will instruct you on the time to drink the  Pre-Surgery Ensure depending on your surgery time. Finish the drink at the designated time by the pre-op nurse.  Nothing else to drink after completing the  Pre-Surgery Clear Ensure.  The day of surgery (if you have diabetes): Drink ONE (1) Gatorade 2 (G2) as directed. This drink was given to you during your hospital  pre-op appointment visit.  The pre-op nurse will instruct you on the time to drink the   Gatorade 2 (G2) depending on your surgery time. Color of the Gatorade may vary. Red is not allowed. Nothing else to drink after completing the  Gatorade 2 (G2).         If you have questions, please contact your surgeons office.  Surgical soap given with instructions.

## 2021-05-12 ENCOUNTER — Encounter (HOSPITAL_BASED_OUTPATIENT_CLINIC_OR_DEPARTMENT_OTHER)
Admission: RE | Disposition: A | Discharge status: Home / Self Care | Payer: Self-pay | Source: Home / Self Care | Attending: Surgery

## 2021-05-12 ENCOUNTER — Ambulatory Visit (HOSPITAL_BASED_OUTPATIENT_CLINIC_OR_DEPARTMENT_OTHER): Payer: 59 | Admitting: Anesthesiology

## 2021-05-12 ENCOUNTER — Encounter (HOSPITAL_BASED_OUTPATIENT_CLINIC_OR_DEPARTMENT_OTHER): Payer: Self-pay | Admitting: Surgery

## 2021-05-12 ENCOUNTER — Other Ambulatory Visit: Payer: Self-pay

## 2021-05-12 ENCOUNTER — Ambulatory Visit (HOSPITAL_BASED_OUTPATIENT_CLINIC_OR_DEPARTMENT_OTHER)
Admission: RE | Admit: 2021-05-12 | Discharge: 2021-05-13 | Disposition: A | Discharge status: Home / Self Care | Payer: 59 | Attending: Surgery | Admitting: Surgery

## 2021-05-12 DIAGNOSIS — K432 Incisional hernia without obstruction or gangrene: Secondary | ICD-10-CM | POA: Insufficient documentation

## 2021-05-12 DIAGNOSIS — I10 Essential (primary) hypertension: Secondary | ICD-10-CM | POA: Insufficient documentation

## 2021-05-12 DIAGNOSIS — Z87891 Personal history of nicotine dependence: Secondary | ICD-10-CM | POA: Diagnosis not present

## 2021-05-12 DIAGNOSIS — I700 Atherosclerosis of aorta: Secondary | ICD-10-CM | POA: Insufficient documentation

## 2021-05-12 DIAGNOSIS — N281 Cyst of kidney, acquired: Secondary | ICD-10-CM | POA: Diagnosis not present

## 2021-05-12 DIAGNOSIS — Z79899 Other long term (current) drug therapy: Secondary | ICD-10-CM | POA: Insufficient documentation

## 2021-05-12 DIAGNOSIS — N400 Enlarged prostate without lower urinary tract symptoms: Secondary | ICD-10-CM | POA: Insufficient documentation

## 2021-05-12 DIAGNOSIS — G8929 Other chronic pain: Secondary | ICD-10-CM | POA: Insufficient documentation

## 2021-05-12 HISTORY — PX: INCISIONAL HERNIA REPAIR: SHX193

## 2021-05-12 HISTORY — DX: Cardiac arrhythmia, unspecified: I49.9

## 2021-05-12 HISTORY — DX: Personal history of urinary calculi: Z87.442

## 2021-05-12 HISTORY — DX: Incisional hernia without obstruction or gangrene: K43.2

## 2021-05-12 SURGERY — REPAIR, HERNIA, INCISIONAL
Anesthesia: General | Site: Abdomen

## 2021-05-12 MED ORDER — HYDROMORPHONE HCL 1 MG/ML IJ SOLN
0.2500 mg | INTRAMUSCULAR | Status: DC | PRN
  Administered 2021-05-12 (×4): 0.5 mg via INTRAVENOUS

## 2021-05-12 MED ORDER — PROMETHAZINE HCL 25 MG/ML IJ SOLN: 6.2500 mg | INTRAMUSCULAR | Status: DC | PRN

## 2021-05-12 MED ORDER — ACETAMINOPHEN 325 MG RE SUPP: 650.0000 mg | RECTAL | Status: DC | PRN

## 2021-05-12 MED ORDER — IBUPROFEN 800 MG PO TABS: 800.0000 mg | ORAL_TABLET | Freq: Three times a day (TID) | ORAL | 0 refills | Status: DC | PRN

## 2021-05-12 MED ORDER — LIDOCAINE 2% (20 MG/ML) 5 ML SYRINGE
INTRAMUSCULAR | Status: DC | PRN
Start: 2021-05-12 — End: 2021-05-12
  Administered 2021-05-12: 100 mg via INTRAVENOUS

## 2021-05-12 MED ORDER — KETOROLAC TROMETHAMINE 30 MG/ML IJ SOLN
INTRAMUSCULAR | Status: AC
  Filled 2021-05-12: qty 1

## 2021-05-12 MED ORDER — DEXTROSE-NACL 5-0.9 % IV SOLN: INTRAVENOUS | Status: DC

## 2021-05-12 MED ORDER — CEFAZOLIN SODIUM-DEXTROSE 2-4 GM/100ML-% IV SOLN
INTRAVENOUS | Status: AC
  Filled 2021-05-12: qty 100

## 2021-05-12 MED ORDER — LORAZEPAM 1 MG PO TABS
0.5000 mg | ORAL_TABLET | Freq: Every day | ORAL | Status: DC
  Administered 2021-05-12: 22:00:00 0.5 mg via ORAL
  Filled 2021-05-12: qty 1

## 2021-05-12 MED ORDER — KETOROLAC TROMETHAMINE 30 MG/ML IJ SOLN
30.0000 mg | Freq: Once | INTRAMUSCULAR | Status: AC
  Administered 2021-05-12: 16:00:00 30 mg via INTRAVENOUS

## 2021-05-12 MED ORDER — OXYCODONE HCL 5 MG PO TABS
ORAL_TABLET | ORAL | Status: AC
  Filled 2021-05-12: qty 2

## 2021-05-12 MED ORDER — ONDANSETRON HCL 4 MG/2ML IJ SOLN
INTRAMUSCULAR | Status: AC
  Filled 2021-05-12: qty 2

## 2021-05-12 MED ORDER — SUGAMMADEX SODIUM 200 MG/2ML IV SOLN
INTRAVENOUS | Status: DC | PRN
  Administered 2021-05-12: 200 mg via INTRAVENOUS

## 2021-05-12 MED ORDER — OXYCODONE HCL 5 MG PO TABS: 5.0000 mg | ORAL_TABLET | Freq: Once | ORAL | Status: DC | PRN

## 2021-05-12 MED ORDER — BUPIVACAINE-EPINEPHRINE 0.25% -1:200000 IJ SOLN
INTRAMUSCULAR | Status: DC | PRN
  Administered 2021-05-12: 20 mL

## 2021-05-12 MED ORDER — ACETAMINOPHEN 325 MG PO TABS: 650.0000 mg | ORAL_TABLET | ORAL | Status: DC | PRN

## 2021-05-12 MED ORDER — KETOROLAC TROMETHAMINE 30 MG/ML IJ SOLN
30.0000 mg | Freq: Four times a day (QID) | INTRAMUSCULAR | Status: DC
  Administered 2021-05-13 (×2): 30 mg via INTRAVENOUS
  Filled 2021-05-12 (×2): qty 1

## 2021-05-12 MED ORDER — PROPOFOL 10 MG/ML IV BOLUS
INTRAVENOUS | Status: AC
  Filled 2021-05-12: qty 20

## 2021-05-12 MED ORDER — ONDANSETRON HCL 4 MG/2ML IJ SOLN
INTRAMUSCULAR | Status: AC
  Filled 2021-05-12: qty 10

## 2021-05-12 MED ORDER — HYDROMORPHONE HCL 1 MG/ML IJ SOLN
INTRAMUSCULAR | Status: AC
  Filled 2021-05-12: qty 0.5

## 2021-05-12 MED ORDER — CEFAZOLIN SODIUM-DEXTROSE 2-4 GM/100ML-% IV SOLN
2.0000 g | INTRAVENOUS | Status: AC
  Administered 2021-05-12: 15:00:00 2 g via INTRAVENOUS

## 2021-05-12 MED ORDER — LACTATED RINGERS IV SOLN: INTRAVENOUS | Status: DC

## 2021-05-12 MED ORDER — MORPHINE SULFATE (PF) 4 MG/ML IV SOLN: 2.0000 mg | INTRAVENOUS | Status: DC | PRN

## 2021-05-12 MED ORDER — ACETAMINOPHEN 500 MG PO TABS
1000.0000 mg | ORAL_TABLET | Freq: Four times a day (QID) | ORAL | Status: DC
  Administered 2021-05-13 (×2): 1000 mg via ORAL
  Filled 2021-05-12 (×2): qty 2

## 2021-05-12 MED ORDER — FENTANYL CITRATE (PF) 100 MCG/2ML IJ SOLN
INTRAMUSCULAR | Status: DC | PRN
  Administered 2021-05-12: 100 ug via INTRAVENOUS

## 2021-05-12 MED ORDER — LIDOCAINE 2% (20 MG/ML) 5 ML SYRINGE
INTRAMUSCULAR | Status: AC
  Filled 2021-05-12: qty 5

## 2021-05-12 MED ORDER — SODIUM CHLORIDE 0.9% FLUSH: 3.0000 mL | INTRAVENOUS | Status: DC | PRN

## 2021-05-12 MED ORDER — MIDAZOLAM HCL 5 MG/5ML IJ SOLN
INTRAMUSCULAR | Status: DC | PRN
  Administered 2021-05-12: 2 mg via INTRAVENOUS

## 2021-05-12 MED ORDER — ROCURONIUM BROMIDE 10 MG/ML (PF) SYRINGE
PREFILLED_SYRINGE | INTRAVENOUS | Status: AC
  Filled 2021-05-12: qty 10

## 2021-05-12 MED ORDER — ACETAMINOPHEN 10 MG/ML IV SOLN
INTRAVENOUS | Status: AC
  Filled 2021-05-12: qty 100

## 2021-05-12 MED ORDER — PROPOFOL 10 MG/ML IV BOLUS
INTRAVENOUS | Status: DC | PRN
  Administered 2021-05-12: 150 mg via INTRAVENOUS

## 2021-05-12 MED ORDER — OXYCODONE HCL 5 MG/5ML PO SOLN: 5.0000 mg | Freq: Once | ORAL | Status: DC | PRN

## 2021-05-12 MED ORDER — DEXAMETHASONE SODIUM PHOSPHATE 10 MG/ML IJ SOLN
INTRAMUSCULAR | Status: AC
  Filled 2021-05-12: qty 1

## 2021-05-12 MED ORDER — MIDAZOLAM HCL 2 MG/2ML IJ SOLN
INTRAMUSCULAR | Status: AC
  Filled 2021-05-12: qty 2

## 2021-05-12 MED ORDER — METHOCARBAMOL 500 MG PO TABS
500.0000 mg | ORAL_TABLET | Freq: Once | ORAL | Status: AC
  Administered 2021-05-12: 17:00:00 500 mg via ORAL
  Filled 2021-05-12: qty 1

## 2021-05-12 MED ORDER — MEPERIDINE HCL 25 MG/ML IJ SOLN: 6.2500 mg | INTRAMUSCULAR | Status: DC | PRN

## 2021-05-12 MED ORDER — AMISULPRIDE (ANTIEMETIC) 5 MG/2ML IV SOLN: 10.0000 mg | Freq: Once | INTRAVENOUS | Status: DC | PRN

## 2021-05-12 MED ORDER — OXYCODONE HCL 5 MG PO TABS: 5.0000 mg | ORAL_TABLET | Freq: Four times a day (QID) | ORAL | 0 refills | Status: DC | PRN

## 2021-05-12 MED ORDER — OXYCODONE HCL 5 MG PO TABS
10.0000 mg | ORAL_TABLET | Freq: Once | ORAL | Status: AC | PRN
  Administered 2021-05-12: 17:00:00 10 mg via ORAL

## 2021-05-12 MED ORDER — SODIUM CHLORIDE 0.9% FLUSH: 3.0000 mL | Freq: Two times a day (BID) | INTRAVENOUS | Status: DC

## 2021-05-12 MED ORDER — OXYCODONE HCL 5 MG/5ML PO SOLN: 10.0000 mg | Freq: Once | ORAL | Status: AC | PRN

## 2021-05-12 MED ORDER — FENTANYL CITRATE (PF) 100 MCG/2ML IJ SOLN
INTRAMUSCULAR | Status: AC
  Filled 2021-05-12: qty 2

## 2021-05-12 MED ORDER — HYDROMORPHONE HCL 1 MG/ML IJ SOLN
0.2500 mg | INTRAMUSCULAR | Status: DC | PRN
  Administered 2021-05-12 (×4): 0.5 mg via INTRAVENOUS

## 2021-05-12 MED ORDER — ACETAMINOPHEN 10 MG/ML IV SOLN
1000.0000 mg | Freq: Once | INTRAVENOUS | Status: AC
  Administered 2021-05-12: 16:00:00 1000 mg via INTRAVENOUS

## 2021-05-12 MED ORDER — ROCURONIUM BROMIDE 10 MG/ML (PF) SYRINGE
PREFILLED_SYRINGE | INTRAVENOUS | Status: DC | PRN
  Administered 2021-05-12: 100 mg via INTRAVENOUS

## 2021-05-12 MED ORDER — ONDANSETRON HCL 4 MG/2ML IJ SOLN
INTRAMUSCULAR | Status: DC | PRN
  Administered 2021-05-12: 4 mg via INTRAVENOUS

## 2021-05-12 MED ORDER — OXYCODONE HCL 5 MG PO TABS
5.0000 mg | ORAL_TABLET | ORAL | Status: DC | PRN
  Administered 2021-05-12 – 2021-05-13 (×3): 10 mg via ORAL
  Filled 2021-05-12 (×3): qty 2

## 2021-05-12 MED ORDER — SODIUM CHLORIDE 0.9 % IV SOLN: 250.0000 mL | INTRAVENOUS | Status: DC | PRN

## 2021-05-12 MED ORDER — DEXAMETHASONE SODIUM PHOSPHATE 10 MG/ML IJ SOLN
INTRAMUSCULAR | Status: DC | PRN
  Administered 2021-05-12: 10 mg via INTRAVENOUS

## 2021-05-12 SURGICAL SUPPLY — 47 items
BLADE CLIPPER SURG (BLADE) ×2 IMPLANT
BLADE SURG 10 STRL SS (BLADE) ×1 IMPLANT
BLADE SURG 15 STRL LF DISP TIS (BLADE) ×1 IMPLANT
BLADE SURG 15 STRL SS (BLADE) ×3
CANISTER SUCT 1200ML W/VALVE (MISCELLANEOUS) ×1 IMPLANT
CHLORAPREP W/TINT 26 (MISCELLANEOUS) ×3 IMPLANT
COVER BACK TABLE 60X90IN (DRAPES) ×3 IMPLANT
COVER MAYO STAND STRL (DRAPES) ×3 IMPLANT
DECANTER SPIKE VIAL GLASS SM (MISCELLANEOUS) ×1 IMPLANT
DERMABOND ADVANCED (GAUZE/BANDAGES/DRESSINGS) ×2
DERMABOND ADVANCED .7 DNX12 (GAUZE/BANDAGES/DRESSINGS) ×1 IMPLANT
DRAPE LAPAROTOMY TRNSV 102X78 (DRAPES) ×3 IMPLANT
DRAPE UTILITY XL STRL (DRAPES) ×3 IMPLANT
ELECT COATED BLADE 2.86 ST (ELECTRODE) ×3 IMPLANT
ELECT REM PT RETURN 9FT ADLT (ELECTROSURGICAL) ×3
ELECTRODE REM PT RTRN 9FT ADLT (ELECTROSURGICAL) ×1 IMPLANT
GLOVE SRG 8 PF TXTR STRL LF DI (GLOVE) ×1 IMPLANT
GLOVE SURG LTX SZ8 (GLOVE) ×3 IMPLANT
GLOVE SURG POLYISO LF SZ6.5 (GLOVE) ×2 IMPLANT
GLOVE SURG UNDER POLY LF SZ6.5 (GLOVE) ×2 IMPLANT
GLOVE SURG UNDER POLY LF SZ7 (GLOVE) ×2 IMPLANT
GLOVE SURG UNDER POLY LF SZ8 (GLOVE) ×3
GOWN STRL REUS W/ TWL LRG LVL3 (GOWN DISPOSABLE) ×2 IMPLANT
GOWN STRL REUS W/ TWL XL LVL3 (GOWN DISPOSABLE) ×1 IMPLANT
GOWN STRL REUS W/TWL LRG LVL3 (GOWN DISPOSABLE) ×3
GOWN STRL REUS W/TWL XL LVL3 (GOWN DISPOSABLE) ×3
MESH VENTRALEX ST 8CM LRG (Mesh General) ×2 IMPLANT
NDL HYPO 25X1 1.5 SAFETY (NEEDLE) ×1 IMPLANT
NEEDLE HYPO 22GX1.5 SAFETY (NEEDLE) IMPLANT
NEEDLE HYPO 25X1 1.5 SAFETY (NEEDLE) ×3 IMPLANT
NS IRRIG 1000ML POUR BTL (IV SOLUTION) ×2 IMPLANT
PACK BASIN DAY SURGERY FS (CUSTOM PROCEDURE TRAY) ×3 IMPLANT
PENCIL SMOKE EVACUATOR (MISCELLANEOUS) ×3 IMPLANT
SLEEVE SCD COMPRESS KNEE MED (STOCKING) ×2 IMPLANT
STAPLER VISISTAT 35W (STAPLE) IMPLANT
SUT MON AB 4-0 PC3 18 (SUTURE) ×3 IMPLANT
SUT NOVA NAB GS-21 1 T12 (SUTURE) ×8 IMPLANT
SUT NOVA NAB GS-22 2 0 T19 (SUTURE) IMPLANT
SUT SILK 3 0 SH 30 (SUTURE) IMPLANT
SUT VIC AB 2-0 SH 27 (SUTURE) ×6
SUT VIC AB 2-0 SH 27XBRD (SUTURE) ×1 IMPLANT
SUT VICRYL 3-0 CR8 SH (SUTURE) ×2 IMPLANT
SYR CONTROL 10ML LL (SYRINGE) ×3 IMPLANT
TOWEL GREEN STERILE FF (TOWEL DISPOSABLE) ×3 IMPLANT
TUBE CONNECTING 20'X1/4 (TUBING) ×1
TUBE CONNECTING 20X1/4 (TUBING) ×2 IMPLANT
YANKAUER SUCT BULB TIP NO VENT (SUCTIONS) ×3 IMPLANT

## 2021-05-12 NOTE — Anesthesia Preprocedure Evaluation (Signed)
Anesthesia Evaluation  Patient identified by MRN, date of birth, ID band Patient awake    Reviewed: Allergy & Precautions, H&P , NPO status , Patient's Chart, lab work & pertinent test results  Airway Mallampati: II  TM Distance: >3 FB Neck ROM: Full    Dental no notable dental hx.    Pulmonary neg pulmonary ROS, former smoker,    Pulmonary exam normal breath sounds clear to auscultation       Cardiovascular hypertension, Pt. on medications and Pt. on home beta blockers Normal cardiovascular exam Rhythm:Regular Rate:Normal  H/O SVT in past. Controlled with beta blocker   Neuro/Psych Anxiety Depression negative neurological ROS  negative psych ROS   GI/Hepatic Neg liver ROS, GERD  ,  Endo/Other  negative endocrine ROS  Renal/GU negative Renal ROS  negative genitourinary   Musculoskeletal negative musculoskeletal ROS (+)   Abdominal (+) + obese,   Peds negative pediatric ROS (+)  Hematology negative hematology ROS (+)   Anesthesia Other Findings   Reproductive/Obstetrics negative OB ROS                             Anesthesia Physical  Anesthesia Plan  ASA: II  Anesthesia Plan: General   Post-op Pain Management:    Induction: Intravenous  PONV Risk Score and Plan: 2 and Ondansetron, Treatment may vary due to age or medical condition and Midazolam  Airway Management Planned: Oral ETT  Additional Equipment:   Intra-op Plan:   Post-operative Plan: Extubation in OR  Informed Consent: I have reviewed the patients History and Physical, chart, labs and discussed the procedure including the risks, benefits and alternatives for the proposed anesthesia with the patient or authorized representative who has indicated his/her understanding and acceptance.     Dental advisory given  Plan Discussed with: CRNA and Surgeon  Anesthesia Plan Comments:         Anesthesia Quick  Evaluation

## 2021-05-12 NOTE — Interval H&P Note (Signed)
History and Physical Interval Note:  05/12/2021 2:20 PM  Kevin Jackson  has presented today for surgery, with the diagnosis of INCISIONAL HERNIA.  The various methods of treatment have been discussed with the patient and family. After consideration of risks, benefits and other options for treatment, the patient has consented to  Procedure(s): [place] (N/A) as a surgical intervention.  The patient's history has been reviewed, patient examined, no change in status, stable for surgery.  I have reviewed the patient's chart and labs.  Questions were answered to the patient's satisfaction.     [place]

## 2021-05-12 NOTE — Addendum Note (Signed)
Addendum  created 05/12/21 1706 by Pervis Hocking, DO   Clinical Note Signed, Order list changed, Pharmacy for encounter modified

## 2021-05-12 NOTE — Addendum Note (Signed)
Addendum  created 05/12/21 1700 by Pervis Hocking, DO   Clinical Note Signed, Order Reconciliation Section accessed, Order list changed

## 2021-05-12 NOTE — Anesthesia Postprocedure Evaluation (Addendum)
Anesthesia Post Note  [name]  Procedure(s) Performed: INCISIONAL HERNIA REPAIR WITH MESH (Abdomen)     Patient location during evaluation: PACU Anesthesia Type: General Level of consciousness: awake and alert, oriented and patient cooperative Pain management: pain level controlled Vital Signs Assessment: post-procedure vital signs reviewed and stable Respiratory status: spontaneous breathing, nonlabored ventilation and respiratory function stable Cardiovascular status: blood pressure returned to baseline and stable Postop Assessment: no apparent nausea or vomiting Anesthetic complications: no Comments: Uncontrolled pain in PACU despite multimodals and high dose narcotics. Hx chronic pain on high dose home opiates, d/w pt and he is not comfortable going home tonight. Will stay in [place] overnight pain control    No notable events documented.  Last Vitals:  Vitals:   05/12/21 1552 05/12/21 1600  BP:  132/83  Pulse:  73  Resp:  14  Temp:    SpO2: 95% 95%    Last Pain:  Vitals:   05/12/21 1600  TempSrc:   PainSc: [place]

## 2021-05-12 NOTE — Anesthesia Procedure Notes (Signed)
Procedure Name: Intubation Date/Time: 05/12/2021 2:43 PM Performed by: Myna Bright, CRNA Pre-anesthesia Checklist: Patient identified, Emergency Drugs available, Suction available and Patient being monitored Patient Re-evaluated:Patient Re-evaluated prior to induction Oxygen Delivery Method: Circle system utilized Preoxygenation: Pre-oxygenation with 100% oxygen Induction Type: IV induction Ventilation: Mask ventilation without difficulty Laryngoscope Size: Mac and 4 Grade View: Grade II Tube type: Oral Tube size: 7.5 mm Number of attempts: 1 Airway Equipment and Method: Stylet Placement Confirmation: ETT inserted through vocal cords under direct vision, positive ETCO2 and breath sounds checked- equal and bilateral Secured at: 22 cm Tube secured with: Tape Dental Injury: Teeth and Oropharynx as per pre-operative assessment

## 2021-05-12 NOTE — H&P (Signed)
History of Present Illness: Kevin Jackson is a 50 y.o. male who is seen today for follow-up of epigastric abdominal wall bulge. He has history of previous surgery there with resection of his xiphoid. A CT scan review which shows some laxity. Of note they commented about an umbilical hernia. He is here today to review these results and discuss future plans. There is been no change in his clinical status though..  Review of Systems: A complete review of systems was obtained from the patient. I have reviewed this information and discussed as appropriate with the patient. See HPI as well for other ROS.    Medical History: Past Medical History:  Diagnosis Date   Hypertension   There is no problem list on file for this patient.  Past Surgical History:  Procedure Laterality Date   APPENDECTOMY   CYSTOSCOPY    No Known Allergies  Current Outpatient Medications on File Prior to Visit  Medication Sig Dispense Refill   atorvastatin (LIPITOR) 40 MG tablet Take 40 mg by mouth once daily   diltiazem (TIAZAC) 180 MG ER capsule Take 1 capsule by mouth once daily   HYDROcodone-acetaminophen (NORCO) 10-325 mg tablet Take 1 tablet by mouth every 8 (eight) hours as needed   methocarbamoL (ROBAXIN) 500 MG tablet Take 1 tablet by mouth 3 (three) times daily   pregabalin (LYRICA) 75 MG capsule Take 75 mg by mouth 2 (two) times daily   No current facility-administered medications on file prior to visit.   Family History  Problem Relation Age of Onset   High blood pressure (Hypertension) Mother   High blood pressure (Hypertension) Father    Social History   Tobacco Use  Smoking Status Former   Types: Cigarettes   Quit date: 2011   Years since quitting: 11.8  Smokeless Tobacco Never    Social History   Socioeconomic History   Marital status: Married  Tobacco Use   Smoking status: Former  Types: Cigarettes  Quit date: 2011  Years since quitting: 11.8   Smokeless tobacco: Never   Substance and Sexual Activity   Alcohol use: Never   Drug use: Never   Objective:   There were no vitals filed for this visit.  There is no height or weight on file to calculate BMI.  Physical Exam Constitutional:  Appearance: Normal appearance.  HENT:  Head: Normocephalic.  Eyes:  General: No scleral icterus. Pupils: Pupils are equal, round, and reactive to light.  Abdominal:  General: Abdomen is flat.  Scar upper midline with hernia noted reducible 3 cm  Musculoskeletal:  Cervical back: Normal range of motion.  Neurological:  Mental Status: He is alert.     Labs, Imaging and Diagnostic Testing:  CLINICAL DATA: Prior hernia repair and appendectomy. Abdominal wall bulge question recurrent hernia. History of bleeding ulcers.   EXAM: CT ABDOMEN AND PELVIS WITH CONTRAST   TECHNIQUE: Multidetector CT imaging of the abdomen and pelvis was performed using the standard protocol following bolus administration of intravenous contrast. Sagittal and coronal MPR images reconstructed from axial data set.   Creatinine was obtained on site at [facility] at [address]   Results: Creatinine 1.0 mg/dL. GFR 92 mL/min   CONTRAST: 15mL ISOVUE-300 IOPAMIDOL (ISOVUE-300) INJECTION 61% IV. Dilute oral contrast.   COMPARISON: 07/15/2020   FINDINGS: Lower chest: Minimal dependent atelectasis at lung bases.   Hepatobiliary: Gallbladder and liver normal appearance   Pancreas: Normal appearance   Spleen: Normal appearance. Small splenules noted.   Adrenals/Urinary Tract:  Adrenal glands normal appearance. BILATERAL renal cysts, largest at upper pole LEFT kidney 3.3 x 3.0 cm. Single tiny calcification within RIGHT kidney. No solid renal mass, hydronephrosis or hydroureter. Bladder unremarkable.   Stomach/Bowel: Appendix surgically absent by history. Partial small bowel lower to a shin with proximal jejunum in RIGHT upper quadrant. Stomach and bowel loops  otherwise normal appearance   Vascular/Lymphatic: Atherosclerotic calcification aorta without aneurysm. No adenopathy.   Reproductive: Mild prostatic enlargement   Other: No free air or free fluid. Tiny umbilical hernia containing fat. Area of slight laxity of anterior abdominal wall at site of prior xiphoid resection, unchanged.   Musculoskeletal: Postoperative changes of lumbar spine on LEFT at L3-L4. No acute osseous findings.   IMPRESSION: Tiny umbilical hernia containing fat.   BILATERAL renal cysts.   Mild prostatic enlargement.   No acute intra-abdominal or intrapelvic abnormalities.   Aortic Atherosclerosis (ICD10-I70.0).     Electronically Signed By: Lavonia Dana M.D. On: 03/22/2021 15:13  Assessment and Plan:  Diagnoses and all orders for this visit:  Incisional hernia, without obstruction or gangrene    Reviewed CT scan with the patient today. Radiologist called umbilical hernia but on exam I think this is a wide umbilicus. There is no fascial defect. His epigastric region does have some mild bulging. I reviewed the films as well myself and there could be a small hernia there. I recommended repair of this with mesh as an outpatient. Its probably 2 cm in maximal diameter. He also has an element of diastases there as well but I do believe there is a hernia associated with this. Risks and benefits of hernia surgery discussed. Long-term expectations, recovery, complications of bleeding, infection, recurrence, use of mesh with chronic pain, organ injury, cardiovascular risk, DVT, and other potential risk discussed and he wishes to proceed at this point with repair.

## 2021-05-12 NOTE — Discharge Instructions (Addendum)
[facility] _______[facility]  UMBILICAL OR INGUINAL HERNIA REPAIR: POST OP INSTRUCTIONS  Always review your discharge instruction sheet given to you by the facility where your surgery was performed. IF YOU HAVE DISABILITY OR FAMILY LEAVE FORMS, YOU MUST BRING THEM TO THE OFFICE FOR PROCESSING.   DO NOT GIVE THEM TO YOUR DOCTOR.  1. A  prescription for pain medication may be given to you upon discharge.  Take your pain medication as prescribed, if needed.  If narcotic pain medicine is not needed, then you may take acetaminophen (Tylenol) or ibuprofen (Advil) as needed. 2. Take your usually prescribed medications unless otherwise directed. If you need a refill on your pain medication, please contact your pharmacy.  They will contact our office to request authorization. Prescriptions will not be filled after 5 pm or on week-ends. 3. You should follow a light diet the first 24 hours after arrival home, such as soup and crackers, etc.  Be sure to include lots of fluids daily.  Resume your normal diet the day after surgery. 4.Most patients will experience some swelling and bruising around the umbilicus or in the groin and scrotum.  Ice packs and reclining will help.  Swelling and bruising can take several days to resolve.  6. It is common to experience some constipation if taking pain medication after surgery.  Increasing fluid intake and taking a stool softener (such as Colace) will usually help or prevent this problem from occurring.  A mild laxative (Milk of Magnesia or Miralax) should be taken according to package directions if there are no bowel movements after 48 hours. 7. Unless discharge instructions indicate otherwise, you may remove your bandages 24-48 hours after surgery, and you may shower at that time.  You may have steri-strips (small skin tapes) in place directly over the incision.  These strips should be left on the skin for 7-10 days.  If your surgeon used skin glue on the  incision, you may shower in 24 hours.  The glue will flake off over the next 2-3 weeks.  Any sutures or staples will be removed at the office during your follow-up visit. 8. ACTIVITIES:  You may resume regular (light) daily activities beginning the next day--such as daily self-care, walking, climbing stairs--gradually increasing activities as tolerated.  You may have sexual intercourse when it is comfortable.  Refrain from any heavy lifting or straining until approved by your doctor.  a.You may drive when you are no longer taking prescription pain medication, you can comfortably wear a seatbelt, and you can safely maneuver your car and apply brakes. b.RETURN TO WORK:   _____________________________________________  9.You should see your doctor in the office for a follow-up appointment approximately 2-3 weeks after your surgery.  Make sure that you call for this appointment within a day or two after you arrive home to insure a convenient appointment time. 10.OTHER INSTRUCTIONS: _________________________    _____________________________________  WHEN TO CALL YOUR DOCTOR: Fever over 101.0 Inability to urinate Nausea and/or vomiting Extreme swelling or bruising Continued bleeding from incision. Increased pain, redness, or drainage from the incision  The clinic staff is available to answer your questions during regular business hours.  Please don't hesitate to call and ask to speak to one of the nurses for clinical concerns.  If you have a medical emergency, go to the nearest emergency room or call 911.  A surgeon from [facility] is always on call at the hospital   [address], [address],   [place], [place]  [address] ?  [address], [place], [place]   [address] [phone] ? [phone] ? FAX [phone] Web site: [email or web address]    Post Anesthesia Home Care Instructions  Activity: Get plenty of rest for the remainder of the day. A responsible  individual must stay with you for 24 hours following the procedure.  For the next 24 hours, DO NOT: -Drive a car -Paediatric nurse -Drink alcoholic beverages -Take any medication unless instructed by your physician -Make any legal decisions or sign important papers.  Meals: Start with liquid foods such as gelatin or soup. Progress to regular foods as tolerated. Avoid greasy, spicy, heavy foods. If nausea and/or vomiting occur, drink only clear liquids until the nausea and/or vomiting subsides. Call your physician if vomiting continues.  Special Instructions/Symptoms: Your throat may feel dry or sore from the anesthesia or the breathing tube placed in your throat during surgery. If this causes discomfort, gargle with warm salt water. The discomfort should disappear within 24 hours.  If you had a scopolamine patch placed behind your ear for the management of post- operative nausea and/or vomiting:  1. The medication in the patch is effective for 72 hours, after which it should be removed.  Wrap patch in a tissue and discard in the trash. Wash hands thoroughly with soap and water. 2. You may remove the patch earlier than 72 hours if you experience unpleasant side effects which may include dry mouth, dizziness or visual disturbances. 3. Avoid touching the patch. Wash your hands with soap and water after contact with the patch.     No Tylenol until 10:01 pm No Ibuprofen/Motrin until 10 pm

## 2021-05-12 NOTE — Op Note (Addendum)
Preoperative diagnosis incisional hernia reducible recurrent measuring 2 cm  Postoperative diagnosis: Incisional hernia reducible recurrent measuring 4 cm x 3 cm  Procedure: Repair of incisional hernia with 8 cm circular Bard coated mesh in a submuscular position  Surgeon: Erroll Luna, MD  Anesthesia: General with 0.25% Marcaine plain  EBL: 20 cc  Specimen: None  Drains: None  IV fluids: Per anesthesia record  Indications for procedure: The patient is a 50 year old male who has a bulge just below his xiphoid.  This was removed many years ago.  CT scan showed weakening of this area and questionable hernia.  It also commented about an umbilical hernia but this upon review and examination was not true.  He desired repair of his incisional hernia.  Risks and benefits of surgery discussed.  Use of mesh and chronic pain discussed.  Recurrence discussed.The risk of hernia repair include bleeding,  Infection,   Recurrence of the hernia,  Mesh use, chronic pain,  Organ injury,  Bowel injury,  Bladder injury,   nerve injury with numbness around the incision,  Death,  and worsening of preexisting  medical problems.  The alternatives to surgery have been discussed as well..  Long term expectations of both operative and non operative treatments have been discussed.   The patient agrees to proceed.     Description of procedure: Patient met in the holding area and questions answered.  Procedure reviewed.  He was examined as well and the site marked.  He was then taken back to the operative room.  He is placed supine upon the OR table.  After induction of general anesthesia, upper chest region below his xiphoid was prepped and draped in a sterile fashion timeout performed.  Proper patient, site and procedure verified.  Local anesthetic was infiltrated along the old scar the entire scar was excised due to keloid.  We dissected down to the midline and encountered significant attenuation of the previous  incision.  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  The entire area measured about 4 cm when she was open.  I opened this into the preperitoneal space.  A prescription around for about 3 cm in all direction under the abdominal wall and fascia in the preperitoneal space.  I used an 8 cm circular Bard mesh with coating on the bottom.  This was placed in the preperitoneal space.  It was secured with submuscular stitches of #1 Novafil.  This was secured circumferentially.  Hemostasis was achieved there is no undue tension.  The fascia was closed with interrupted #1 Novafil.  I then closed the subcutaneous tissue with 2-0 Vicryl.  4 Monocryl used to close the skin.  Dermabond applied.  Dry dressings placed.  Of note local anesthetic was infiltrated prior to closure around the edge of the fascia using 0.25% Marcaine.  All counts were correct.  He was then awoken taken recovery in satisfactory condition.

## 2021-05-12 NOTE — Transfer of Care (Signed)
Immediate Anesthesia Transfer of Care Note  [name]  Procedure(s) Performed: INCISIONAL HERNIA REPAIR WITH MESH (Abdomen)  Patient Location: PACU  Anesthesia Type:General  Level of Consciousness: awake, alert , oriented and patient cooperative  Airway & Oxygen Therapy: Patient Spontanous Breathing and Patient connected to nasal cannula oxygen  Post-op Assessment: Report given to RN, Post -op Vital signs reviewed and stable and Patient moving all extremities  Post vital signs: Reviewed and stable  Last Vitals:  Vitals Value Taken Time  BP 131/81 05/12/21 1539  Temp    Pulse 99 05/12/21 1540  Resp 8 05/12/21 1540  SpO2 98 % 05/12/21 1540  Vitals shown include unvalidated device data.  Last Pain:  Vitals:   05/12/21 1315  TempSrc: Oral  PainSc: 0-No pain         Complications: No notable events documented.

## 2021-05-13 ENCOUNTER — Encounter (HOSPITAL_BASED_OUTPATIENT_CLINIC_OR_DEPARTMENT_OTHER): Payer: Self-pay | Admitting: Surgery

## 2021-05-13 DIAGNOSIS — K432 Incisional hernia without obstruction or gangrene: Secondary | ICD-10-CM | POA: Diagnosis not present

## 2021-05-13 NOTE — Discharge Summary (Signed)
Physician Discharge Summary  Patient ID: Kevin Jackson MRN: [id number] DOB/AGE: November 10, 1970 50 y.o.  Admit date: 05/12/2021 Discharge date: 05/13/2021  Admission Diagnoses:incisional hernia   Discharge Diagnoses: same Active Problems:   * No active hospital problems. *   Discharged Condition: good  Hospital Course: Pt admitted for post op pain control. He did well well overnight and had good pain control. He was ambulating, tolerating his diet and had good pain control.       Treatments: surgery: repair incisional hernia with mesh   Discharge Exam: Blood pressure 115/72, pulse 88, temperature 98.3 F (36.8 C), resp. rate 16, height 5\' 10"  (1.778 m), weight 94.9 kg, SpO2 95 %. General appearance: alert and cooperative Resp: clear to auscultation bilaterally Cardio: regular rate and rhythm Incision/Wound:CDI   Disposition: Discharge disposition: 01-Home or Self Care       Discharge Instructions     Diet - low sodium heart healthy   Complete by: As directed    Diet - low sodium heart healthy   Complete by: As directed    Increase activity slowly   Complete by: As directed    Increase activity slowly   Complete by: As directed       Allergies as of 05/13/2021       Reactions   Bee Venom Swelling        Medication List     STOP taking these medications    HYDROcodone-acetaminophen 10-325 MG tablet Commonly known as: NORCO       TAKE these medications    atorvastatin 40 MG tablet Commonly known as: LIPITOR Take 1 tablet (40 mg total) by mouth daily.   diltiazem 180 MG 24 hr capsule Commonly known as: TIAZAC Take 1 capsule by mouth once daily   ibuprofen 800 MG tablet Commonly known as: ADVIL Take 1 tablet (800 mg total) by mouth every 8 (eight) hours as needed.   methocarbamol 500 MG tablet Commonly known as: ROBAXIN Take 1 tablet by mouth 3 (three) times daily.   oxyCODONE 5 MG immediate release tablet Commonly known as:  Oxy IR/ROXICODONE Take 1 tablet (5 mg total) by mouth every 6 (six) hours as needed for severe pain.   pregabalin 75 MG capsule Commonly known as: LYRICA Take 75 mg by mouth 2 (two) times daily.         Signed: Turner Daniels MD  05/13/2021, 7:30 AM

## 2021-05-23 HISTORY — PX: OTHER SURGICAL HISTORY: SHX169

## 2021-06-14 ENCOUNTER — Other Ambulatory Visit: Payer: Self-pay | Admitting: Family

## 2021-07-20 ENCOUNTER — Encounter: Payer: 59 | Admitting: Family

## 2021-07-27 ENCOUNTER — Ambulatory Visit (INDEPENDENT_AMBULATORY_CARE_PROVIDER_SITE_OTHER): Payer: 59 | Admitting: Family

## 2021-07-27 VITALS — BP 118/64 | HR 77 | Temp 98.1°F | Resp 18 | Ht 70.0 in | Wt 208.2 lb

## 2021-07-27 DIAGNOSIS — E782 Mixed hyperlipidemia: Secondary | ICD-10-CM | POA: Diagnosis not present

## 2021-07-27 DIAGNOSIS — R253 Fasciculation: Secondary | ICD-10-CM

## 2021-07-27 DIAGNOSIS — R251 Tremor, unspecified: Secondary | ICD-10-CM

## 2021-07-27 DIAGNOSIS — R7309 Other abnormal glucose: Secondary | ICD-10-CM

## 2021-07-27 DIAGNOSIS — Z125 Encounter for screening for malignant neoplasm of prostate: Secondary | ICD-10-CM

## 2021-07-27 DIAGNOSIS — Z0000 Encounter for general adult medical examination without abnormal findings: Secondary | ICD-10-CM

## 2021-07-27 DIAGNOSIS — Z1211 Encounter for screening for malignant neoplasm of colon: Secondary | ICD-10-CM

## 2021-07-27 DIAGNOSIS — R0902 Hypoxemia: Secondary | ICD-10-CM

## 2021-07-27 LAB — HEMOGLOBIN A1C: Hgb A1c MFr Bld: 4.9 % (ref 4.6–6.5)

## 2021-07-27 LAB — COMPREHENSIVE METABOLIC PANEL
ALT: 29 U/L (ref 0–53)
AST: 20 U/L (ref 0–37)
Albumin: 4.8 g/dL (ref 3.5–5.2)
Alkaline Phosphatase: 85 U/L (ref 39–117)
BUN: 11 mg/dL (ref 6–23)
CO2: 31 mEq/L (ref 19–32)
Calcium: 9.5 mg/dL (ref 8.4–10.5)
Chloride: 102 mEq/L (ref 96–112)
Creatinine, Ser: 1.01 mg/dL (ref 0.40–1.50)
GFR: 86.74 mL/min (ref >60.00)
Glucose, Bld: 93 mg/dL (ref 70–99)
Potassium: 3.9 mEq/L (ref 3.5–5.1)
Sodium: 139 mEq/L (ref 135–145)
Total Bilirubin: 1.3 mg/dL — ABNORMAL HIGH (ref 0.2–1.2)
Total Protein: 6.9 g/dL (ref 6.0–8.3)

## 2021-07-27 LAB — CBC WITH DIFFERENTIAL/PLATELET
Basophils Absolute: 0 10*3/uL (ref 0.0–0.1)
Basophils Relative: 0.5 % (ref 0.0–3.0)
Eosinophils Absolute: 0.1 10*3/uL (ref 0.0–0.7)
Eosinophils Relative: 1.3 % (ref 0.0–5.0)
HCT: 43.3 % (ref 39.0–52.0)
Hemoglobin: 15.5 g/dL (ref 13.0–17.0)
Lymphocytes Relative: 24.3 % (ref 12.0–46.0)
Lymphs Abs: 1.5 10*3/uL (ref 0.7–4.0)
MCHC: 35.8 g/dL (ref 30.0–36.0)
MCV: 88 fl (ref 78.0–100.0)
Monocytes Absolute: 0.7 10*3/uL (ref 0.1–1.0)
Monocytes Relative: 11.2 % (ref 3.0–12.0)
Neutro Abs: 4 10*3/uL (ref 1.4–7.7)
Neutrophils Relative %: 62.7 % (ref 43.0–77.0)
Platelets: 240 10*3/uL (ref 150.0–400.0)
RBC: 4.92 Mil/uL (ref 4.22–5.81)
RDW: 13.6 % (ref 11.5–15.5)
WBC: 6.4 10*3/uL (ref 4.0–10.5)

## 2021-07-27 LAB — LIPID PANEL
Cholesterol: 129 mg/dL (ref 0–200)
HDL: 41.8 mg/dL (ref >39.00)
LDL Cholesterol: 63 mg/dL (ref 0–99)
NonHDL: 87.31
Total CHOL/HDL Ratio: 3
Triglycerides: 124 mg/dL (ref 0.0–149.0)
VLDL: 24.8 mg/dL (ref 0.0–40.0)

## 2021-07-27 LAB — PSA: PSA: 0.4 ng/mL (ref 0.10–4.00)

## 2021-07-27 LAB — MAGNESIUM: Magnesium: 2.1 mg/dL (ref 1.5–2.5)

## 2021-07-27 LAB — VITAMIN B12: Vitamin B-12: 418 pg/mL (ref 211–911)

## 2021-07-27 NOTE — Progress Notes (Signed)
?Raziel Jackson is a 51 y.o. male with the following history as recorded in EpicCare:  ?Patient Active Problem List  ? Diagnosis Date Noted  ? Daytime somnolence 06/04/2020  ? Encounter for post surgical wound check 01/29/2020  ? 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  ? Mixed hyperlipidemia 09/17/2019  ? Supraventricular tachycardia ([place]) 09/17/2019  ? Chronic chest wall pain 08/28/2015  ? 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 Numerous moles 01/19/2012  ? Hypertension 03/17/2011  ? CHEST PAIN 05/06/2010  ? BACK PAIN, LUMBAR 08/06/2009  ?  ?Current Outpatient Medications  ?Medication Sig Dispense Refill  ? atorvastatin (LIPITOR) 40 MG tablet Take 1 tablet (40 mg total) by mouth daily. 90 tablet 3  ? diltiazem (TIAZAC) 180 MG 24 hr capsule Take 1 capsule by mouth once daily 90 capsule 0  ? oxyCODONE-acetaminophen (PERCOCET) 10-325 MG tablet Take 1 tablet by mouth every 4 (four) hours as needed for pain.    ? pregabalin (LYRICA) 150 MG capsule Take 150 mg by mouth 2 (two) times daily.    ? tadalafil (CIALIS) 20 MG tablet Take 20 mg by mouth daily as needed for erectile dysfunction.    ? ?No current facility-administered medications for this visit.  ?  ?Allergies: Bee venom  ?Past Medical History:  ?Diagnosis Date  ? Anxiety   ? BACK PAIN, LUMBAR 08/06/2009  ? Qualifier: Diagnosis of  By: Regino Schultze CMA Deborra Medina), Apolonio Schneiders    ? CHEST PAIN 05/06/2010  ? Qualifier: Diagnosis of  By: Sherren Mocha MD, Jory Ee   ? Chronic chest wall pain 08/28/2015  ? Depression   ? Dysrhythmia   ? GERD (gastroesophageal reflux disease)   ? History of kidney stones   ? Hypertension   ? Incisional hernia   ? Mixed hyperlipidemia 09/17/2019  ? Neuralgia   ? Numerous moles 01/19/2012  ? 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 Supraventricular tachycardia ([place]) 09/17/2019  ?  ?Past Surgical History:  ?Procedure Laterality Date  ? APPENDECTOMY    ? BACK SURGERY    ? CYSTOSCOPY/URETEROSCOPY/HOLMIUM LASER/STENT  PLACEMENT Right 07/16/2020  ? Procedure: CYSTOSCOPY/URETEROSCOPY/HOLMIUM LASER/STENT PLACEMENT;  Surgeon: [name], MD;  Location: [facility];  Service: Urology;  Laterality: Right;  ? HERNIA REPAIR    ? INCISIONAL HERNIA REPAIR N/A 05/12/2021  ? Procedure: INCISIONAL HERNIA REPAIR WITH MESH;  Surgeon: Erroll Luna, MD;  Location: [facility];  Service: General;  Laterality: N/A;  ? LAPAROSCOPIC GASTROTOMY W/ REPAIR OF ULCER    ? MASS EXCISION N/A 01/22/2020  ? Procedure: EXCISION OF XIPHOID;  Surgeon: Prescott Gum, Collier Salina, MD;  Location: [facility] OR;  Service: Thoracic;  Laterality: N/A;  ? right thoracotomy for exposure for the C7-T8 diskectomy  02/06/2007  ? Dr Arlyce Dice  ? TONSILLECTOMY    ?  ?Family History  ?Problem Relation Age of Onset  ? Hypertension Mother   ? Asthma Mother   ? Cancer Paternal Uncle   ?     intestinal  ? Healthy Sister   ? Healthy Brother   ? Scoliosis Daughter   ? Healthy Son   ? Other Brother   ?     tumors  ?  ?Social History  ? ?Tobacco Use  ? Smoking status: Former  ?  Types: Cigarettes  ?  Quit date: 05/23/1997  ?  Years since quitting: 24.1  ? Smokeless tobacco: Former  ?Substance Use Topics  ? Alcohol use: Yes  ?  Alcohol/week: 6.0 standard drinks  ?  Types: 6 Cans of beer per week  ?  Comment: social  ?  ?Subjective:  ? ?Presents for yearly CPE; has had a complicated surgical year and in the process of applying for long-term disability;  ? ?Has been having increased problems with muscle twitches x 3-4 months; seem to be progressively worsening; concerned about something causing symptoms other than surgical complications;  ? ?Under care of pain management- planning to have discussion about spinal stimulator;  ? ?Review of Systems  ?Constitutional: Negative.   ?HENT: Negative.    ?Eyes: Negative.   ?Respiratory: Negative.    ?Cardiovascular:  Positive for palpitations.  ?Gastrointestinal: Negative.   ?Genitourinary: Negative.   ?Musculoskeletal:  Positive for back pain and joint  pain.  ?Skin: Negative.   ?Neurological:  Positive for tingling and weakness.  ?Endo/Heme/Allergies: Negative.   ?Psychiatric/Behavioral: Negative.    ? ? ? ?Objective:  ?Vitals:  ? 07/27/21 1035  ?BP: 118/64  ?Pulse: 77  ?Resp: 18  ?Temp: 98.1 ?F (36.7 ?C)  ?TempSrc: Oral  ?SpO2: 95%  ?Weight: 208 lb 3.2 oz (94.4 kg)  ?Height: _0  (1.778 m)  ?  ?General: Well developed, well nourished, in no acute distress  ?Skin : Warm and dry.  ?Head: Normocephalic and atraumatic  ?Eyes: Sclera and conjunctiva clear; pupils round and reactive to light; extraocular movements intact  ?Ears: External normal; canals clear; tympanic membranes normal  ?Oropharynx: Pink, supple. No suspicious lesions  ?Neck: Supple without thyromegaly, adenopathy  ?Lungs: Respirations unlabored; clear to auscultation bilaterally without wheeze, rales, rhonchi  ?CVS exam: normal rate and regular rhythm.  ?Abdomen: Soft; nontender; nondistended; normoactive bowel sounds; no masses or hepatosplenomegaly  ?Musculoskeletal: No deformities; no active joint inflammation  ?Extremities: No edema, cyanosis, clubbing  ?Vessels: Symmetric bilaterally  ?Neurologic: Alert and oriented; speech intact; face symmetrical; uses cane for stability; no muscle twitching noted on exam;  ? ?Assessment:  ?1. PE (physical exam), annual   ?2. Muscle twitch   ?3. Tremor   ?4. Elevated glucose   ?5. Mixed hyperlipidemia   ?6. Prostate cancer screening   ?7. Encounter for screening colonoscopy   ?8. Hypoxia   ?  ?Plan:  ? ?Age appropriate preventive healthcare needs addressed; encouraged regular eye doctor and dental exams; encouraged regular exercise; will update labs and refills as needed today; follow-up to be determined; ?Referral updated for screening colonoscopy; ? ?For further evaluation of muscle twitch/ tremors, will refer to neurology; will also have patient follow up with pulmonology regarding episodes of hypoxia when supine; ?Will also need to consider referral back  to cardiology;  ? ?This visit occurred during the SARS-CoV-2 public health emergency.  Safety protocols were in place, including screening questions prior to the visit, additional usage of staff PPE, and extensive cleaning of exam room while observing appropriate contact time as indicated for disinfecting solutions.  ? ? ? ?No follow-ups on file.  ?Orders Placed This Encounter  ?Procedures  ? CBC with Differential/Platelet  ? Comp Met (CMET)  ? B12  ? Magnesium  ? Hemoglobin A1c  ? Lipid panel  ? PSA  ? Ambulatory referral to Neurology  ?  Referral Priority:   Routine  ?  Referral Type:   Consultation  ?  Referral Reason:   Specialty Services Required  ?  Requested Specialty:   Neurology  ?  Number of Visits Requested:   1  ? Ambulatory referral to Gastroenterology  ?  Referral Priority:   Routine  ?  Referral Type:  Consultation  ?  Referral Reason:   Specialty Services Required  ?  Number of Visits Requested:   1  ? Ambulatory referral to Pulmonology  ?  Referral Priority:   Routine  ?  Referral Type:   Consultation  ?  Referral Reason:   Specialty Services Required  ?  Requested Specialty:   Pulmonary Disease  ?  Number of Visits Requested:   1  ?  ?Requested Prescriptions  ? ? No prescriptions requested or ordered in this encounter  ?  ? ?

## 2021-07-28 ENCOUNTER — Encounter: Payer: Self-pay | Admitting: Family

## 2021-07-29 ENCOUNTER — Other Ambulatory Visit: Payer: Self-pay | Admitting: Family

## 2021-08-11 ENCOUNTER — Encounter: Payer: Self-pay | Admitting: Gastroenterology

## 2021-09-06 NOTE — Progress Notes (Signed)
? ?Synopsis: Referred for hypoxia when lying down by [name],* ? ?Subjective:  ? ?PATIENT ID: Kevin Jackson GENDER: male DOB: [birth year], MRN: [id number] ? ?Chief Complaint  ?Patient presents with  ? Pulmonary Consult   ?  Referred by Jodi Mourning for eval of nocturnal hypoxia.   ? 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  ? ?He had sleep study 06/04/20 due to PND episodes but only desaturation observed. He does feel dyspneic when he lies down still. He hasn't tried any swimming since all of this to know if he has any trouble breathing when water gets chest high. Some weight gain over the last year.  ? ?Does also note some dysphagia which has come on since his lumbar surgery repair.  ? ?Since 2008 has also had a chronic cough. He does have reflux, takes tums or rolaids. Doesn't have much in way of sinus congestion/postnasal drainage except over the last couple of days which he attributes to pollen.   ? ?Otherwise pertinent review of systems is negative. ? ?He has no family history of lung disease ? ?Worked in Design[employer], multimedia in past. Has always lived in [place]. Smoked in late 1990s for 1-2 years.  ? ? ? ?Past Medical History:  ?Diagnosis Date  ? Anxiety   ? BACK PAIN, LUMBAR 08/06/2009  ? Qualifier: Diagnosis of  By: Regino Schultze CMA Deborra Medina), Apolonio Schneiders    ? CHEST PAIN 05/06/2010  ? Qualifier: Diagnosis of  By: Sherren Mocha MD, Jory Ee   ? Chronic chest wall pain 08/28/2015  ? Depression   ? Dysrhythmia   ? GERD (gastroesophageal reflux disease)   ? History of kidney stones   ? Hypertension   ? Incisional hernia   ? Mixed  hyperlipidemia 09/17/2019  ? Neuralgia   ? Numerous moles 01/19/2012  ? 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 Supraventricular tachycardia ([place]) 09/17/2019  ?  ? ?Family History  ?Problem Relation Age of Onset  ? Hypertension Mother   ? Asthma Mother   ? Cancer Paternal Uncle   ?     intestinal  ? Healthy Sister   ? Healthy Brother   ? Scoliosis Daughter   ? Healthy Son   ? Other Brother   ?     tumors  ?  ? ?Past Surgical History:  ?Procedure Laterality Date  ? APPENDECTOMY    ? BACK SURGERY    ? CYSTOSCOPY/URETEROSCOPY/HOLMIUM LASER/STENT PLACEMENT Right 07/16/2020  ? Procedure: CYSTOSCOPY/URETEROSCOPY/HOLMIUM LASER/STENT PLACEMENT;  Surgeon: [name], MD;  Location: [facility];  Service: Urology;  Laterality: Right;  ? HERNIA REPAIR    ? INCISIONAL HERNIA REPAIR N/A 05/12/2021  ? Procedure: INCISIONAL HERNIA REPAIR WITH MESH;  Surgeon: Erroll Luna, MD;  Location: [place];  Service: General;  Laterality: N/A;  ? LAPAROSCOPIC GASTROTOMY W/ REPAIR OF ULCER    ? MASS EXCISION N/A 01/22/2020  ? Procedure: EXCISION OF XIPHOID;  Surgeon: Prescott Gum, Collier Salina, MD;  Location: [facility];  Service: Thoracic;  Laterality: N/A;  ? right thoracotomy for exposure for the C7-T8 diskectomy  02/06/2007  ? Dr Arlyce Dice  ? TONSILLECTOMY    ? ? ?Social  History  ? ?Socioeconomic History  ? Marital status: Married  ?  Spouse name: Not on file  ? Number of children: Not on file  ? Years of education: Not on file  ? Highest education level: Not on file  ?Occupational History  ?  Employer: [employer]  ?Tobacco Use  ? Smoking status: Former  ?  Packs/day: 1.00  ?  Years: 2.00  ?  Pack years: 2.00  ?  Types: Cigarettes  ?  Quit date: 05/23/1997  ?  Years since quitting: 24.3  ? Smokeless tobacco: Former  ?Vaping Use  ? Vaping Use: Never used  ?Substance and Sexual Activity  ? Alcohol use: Yes  ?  Alcohol/week: 6.0 standard drinks  ?  Types: 6 Cans of beer per week  ?  Comment: social  ? Drug use: Not Currently  ? Sexual  activity: Yes  ?Other Topics Concern  ? Not on file  ?Social History Narrative  ? Not on file  ? ?Social Determinants of Health  ? ?Financial Resource Strain: Not on file  ?Food Insecurity: Not on file  ?Transportation Needs: Not on file  ?Physical Activity: Not on file  ?Stress: Not on file  ?Social Connections: Not on file  ?Intimate Partner Violence: Not on file  ?  ? ?Allergies  ?Allergen Reactions  ? Bee Venom Swelling  ?  ? ?Outpatient Medications Prior to Visit  ?Medication Sig Dispense Refill  ? atorvastatin (LIPITOR) 40 MG tablet Take 1 tablet by mouth once daily 90 tablet 3  ? diltiazem (TIAZAC) 180 MG 24 hr capsule Take 1 capsule by mouth once daily 90 capsule 0  ? oxyCODONE-acetaminophen (PERCOCET) 10-325 MG tablet Take 1 tablet by mouth every 4 (four) hours as needed for pain.    ? pregabalin (LYRICA) 150 MG capsule Take 150 mg by mouth 2 (two) times daily.    ? tadalafil (CIALIS) 20 MG tablet Take 20 mg by mouth daily as needed for erectile dysfunction.    ? ?No facility-administered medications prior to visit.  ? ? ? ? ? ?Objective:  ? ?Physical Exam: ? ?General appearance: 51 y.o., male, NAD, conversant  ?Eyes: anicteric sclerae; PERRL, tracking appropriately ?HENT: NCAT; MMM ?Neck: Trachea midline; no lymphadenopathy, no JVD ?Lungs: CTAB, no crackles, no wheeze, with normal respiratory effort ?CV: RRR, no murmur  ?Abdomen: Soft, non-tender; non-distended, BS present  ?Extremities: No peripheral edema, warm ?Skin: Normal turgor and texture; no rash ?Psych: Appropriate affect ?Neuro: Alert and oriented to person and place, no focal deficit  ? ? ? ?Vitals:  ? 09/08/21 0916  ?BP: 132/78  ?Pulse: 80  ?Temp: 98 ?F (36.7 ?C)  ?TempSrc: Oral  ?SpO2: 95%  ?Weight: 209 lb (94.8 kg)  ?Height: '5\' 10"'$  (1.778 m)  ? ?95% on RA ?BMI Readings from Last 3 Encounters:  ?09/08/21 29.99 kg/m?  ?07/27/21 29.87 kg/m?  ?05/12/21 30.02 kg/m?  ? ?Wt Readings from Last 3 Encounters:  ?09/08/21 209 lb (94.8 kg)  ?07/27/21 208  lb 3.2 oz (94.4 kg)  ?05/12/21 209 lb 3.5 oz (94.9 kg)  ? ? ? ?CBC ?   ?Component Value Date/Time  ? WBC 6.4 07/27/2021 1127  ? RBC 4.92 07/27/2021 1127  ? HGB 15.5 07/27/2021 1127  ? HGB 17.5 09/17/2019 0927  ? HCT 43.3 07/27/2021 1127  ? HCT 48.9 09/17/2019 0927  ? PLT 240.0 07/27/2021 1127  ? PLT 284 09/17/2019 0927  ? MCV 88.0 07/27/2021 1127  ? MCV 89 09/17/2019 0927  ?  MCH 31.7 07/15/2020 0826  ? MCHC 35.8 07/27/2021 1127  ? RDW 13.6 07/27/2021 1127  ? RDW 13.1 09/17/2019 0927  ? LYMPHSABS 1.5 07/27/2021 1127  ? LYMPHSABS 2.1 09/17/2019 0927  ? MONOABS 0.7 07/27/2021 1127  ? EOSABS 0.1 07/27/2021 1127  ? EOSABS 0.1 09/17/2019 0927  ? BASOSABS 0.0 07/27/2021 1127  ? BASOSABS 0.1 09/17/2019 1779  ? ? ? ?Chest Imaging: ?CT Chest W contrast 12/2019 reviewed by me unremarkable ? ?Pulmonary Functions Testing Results: ?   ? View : No data to display.  ?  ?  ?  ? ?PSG 06/04/20: ?- No significant obstructive sleep apnea occurred during this study (AHI = 3.8/h). ?- No significant central sleep apnea occurred during this study (CAI = 0.0/h). ?- Mild oxygen desaturation was noted during this study (Min O2 = 87.0%). ?- The patient snored with moderate snoring volume. ?- No cardiac abnormalities were noted during this study. ?- Clinically significant periodic limb movements did not occur during sleep. No significant associated arousals. ? ? ?Echocardiogram:  ? ?TTE 05/13/20: ? ? 1. Left ventricular ejection fraction, by estimation, is 65 to 70%. The  ?left ventricle has normal function.  ? 2. The left ventricle has no regional wall motion abnormalities.  ? 3. There is mild concentric left ventricular hypertrophy.  ? 4. Left ventricular diastolic parameters were normal.  ? 5. Right ventricular systolic function is normal. The right ventricular  ?size is normal.  ? 6. The mitral valve is normal in structure. Trivial mitral valve  ?regurgitation.  ? 7. The aortic valve is tricuspid. Aortic valve regurgitation is trivial.  ?No  aortic stenosis is present.  ? 8. Aortic dilatation noted. There is mild dilatation of the ascending  ?aorta, measuring 38 mm.  ? ?Heart Catheterization:  ?   ?Assessment & Plan:  ? ?# Orthopnea ?# Supine hypox

## 2021-09-08 ENCOUNTER — Encounter: Payer: Self-pay | Admitting: Student

## 2021-09-08 ENCOUNTER — Ambulatory Visit: Payer: 59 | Admitting: Student

## 2021-09-08 VITALS — BP 132/78 | HR 80 | Temp 98.0°F | Ht 70.0 in | Wt 209.0 lb

## 2021-09-08 DIAGNOSIS — R053 Chronic cough: Secondary | ICD-10-CM

## 2021-09-08 DIAGNOSIS — R0601 Orthopnea: Secondary | ICD-10-CM | POA: Diagnosis not present

## 2021-09-08 DIAGNOSIS — R1310 Dysphagia, unspecified: Secondary | ICD-10-CM | POA: Diagnosis not present

## 2021-09-08 MED ORDER — PANTOPRAZOLE SODIUM 40 MG PO TBEC: 40.0000 mg | DELAYED_RELEASE_TABLET | Freq: Every day | ORAL | 0 refills | Status: DC

## 2021-09-08 NOTE — Patient Instructions (Signed)
-   You will be called to schedule breathing tests and ABG  ?- See you in 4 weeks to discuss ?

## 2021-09-09 ENCOUNTER — Ambulatory Visit (AMBULATORY_SURGERY_CENTER): Payer: 59 | Admitting: *Deleted

## 2021-09-09 VITALS — Ht 70.0 in | Wt 209.0 lb

## 2021-09-09 DIAGNOSIS — Z1211 Encounter for screening for malignant neoplasm of colon: Secondary | ICD-10-CM

## 2021-09-09 MED ORDER — SUTAB 1479-225-188 MG PO TABS: 24.0000 | ORAL_TABLET | ORAL | 0 refills | Status: DC

## 2021-09-09 NOTE — Progress Notes (Signed)
No egg or soy allergy known to patient  ?No issues known to pt with past sedation with any surgeries or procedures ?Patient denies ever being told they had issues or difficulty with intubation  ?No FH of Malignant Hyperthermia ?Pt is not on diet pills ?Pt is not on  home 02  ?Pt is not on blood thinners  ?Pt states  issues with constipation - uses OTC miralax - it does help but still a slow process all this due to meds  ?No A fib or A flutter ? ?Sutab  Coupon to pt in PV today , Code to Pharmacy and  NO PA's for preps discussed with pt In PV today - pt requested Sutab  ?Discussed with pt there will be an out-of-pocket cost for prep and that varies from $0 to 70 +  dollars - pt verbalized understanding  ?Pt instructed to use Singlecare.com or GoodRx for a price reduction on prep  ? ?PV completed over the phone. Pt verified name, DOB, address and insurance during PV today.  ?Pt mailed instruction packet with copy of consent form to read and not return, and instructions.  ?Pt encouraged to call with questions or issues.  ?If pt has My chart, procedure instructions sent via My Chart  ?Insurance confirmed at [facility]  ?

## 2021-09-16 ENCOUNTER — Ambulatory Visit: Payer: 59 | Admitting: Neurology

## 2021-09-16 ENCOUNTER — Encounter: Payer: Self-pay | Admitting: Neurology

## 2021-09-16 VITALS — BP 119/79 | HR 80 | Ht 70.0 in | Wt 208.0 lb

## 2021-09-16 DIAGNOSIS — M542 Cervicalgia: Secondary | ICD-10-CM | POA: Diagnosis not present

## 2021-09-16 DIAGNOSIS — R269 Unspecified abnormalities of gait and mobility: Secondary | ICD-10-CM | POA: Diagnosis not present

## 2021-09-16 DIAGNOSIS — R202 Paresthesia of skin: Secondary | ICD-10-CM | POA: Diagnosis not present

## 2021-09-16 NOTE — Progress Notes (Addendum)
Chief Complaint  Patient presents with   New Patient (Initial Visit)    Rm 14. Alone. NP internal referral for muscle twitching.      ASSESSMENT AND PLAN  Kevin Jackson is a 51 y.o. male   History of thoracic decompression surgery, lumbar decompression surgery,  Male presenting with frequent left lower extremity jerking, extending to left lower abdominal muscle,  Brisk reflex on examinations,  Proceed with MRI of cervical spine  Laboratory evaluations  DIAGNOSTIC DATA (LABS, IMAGING, TESTING) - I reviewed patient records, labs, notes, testing and imaging myself where available. MRI thoracic in Sept 2021 1. Chronic T7-T8 disc degeneration and postoperative changes are stable since 2012. A subtle residual left paracentral disc herniation there has not progressed since 2012 and does not result in significant stenosis.   2. Thoracic spine degeneration elsewhere remains mild for age. Mild endplate degeneration has developed since 2012 in the lower thoracic spine, but there is no thoracic spinal stenosis or convincing neural impingement.   3. Mild to moderate facet hypertrophy suspected at C6-C7 with right C7 neural foraminal stenosis.   CT lumbar myelogram 1. Interval L3-4 fusion as detailed above. Adjacent 5.5 cm fluid collection in the left psoas muscle, possibly a postoperative seroma or old hematoma. 2. Mild spondylosis elsewhere without evidence of neural impingement.    MEDICAL HISTORY:  Kevin Jackson is a 51 year old male, seen in request by his primary care nurse practitioner [name], for evaluation of muscle twitching, initial evaluation was on September 16, 2021  I reviewed and summarized the referring note. PMHX Supraventricular tachycardia HTN GERD Chronic Low back pain, s/p L3-4 decompression by Dr. Patrice Paradise in 2022, now under pain management taking Norco 10/325 4 tabs a day. 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 Patrice Paradise, he complains of worsening low back pain, radiating pain to left leg, frequent left lower extremity muscle twitching, came on suddenly, over the associated right lower abdominal muscle twitching, lasting 2-3 times, in less than 1 minute, it will improve  He also noticed increased gait abnormality, seems to trip on his left leg, went through few months of physical therapy with limited help  He now feels constant left lower abdominal area discomfort, hot needles sensation, right toe numbness, he has left shoulder pain, if he stretch out his left arm in certain position for a while, his left hand feels cold and numb, he has to shake his hand to make numbness go away, he is not taking Lyrica 150 mg twice a day with limited help  He also complains of change in his bowel and bladder movement since thoracic decompression surgery, he lost the feeling of bowel movement, he has to use extra pressure intentionally, also have complaints of urinary frequency   PHYSICAL EXAM:   Vitals:   09/16/21 0822  BP: 119/79  Pulse: 80  Weight: 208 lb (94.3 kg)  Height: '5\' 10"'$  (1.778 m)  Not recorded     Body mass index is 29.84 kg/m.  PHYSICAL EXAMNIATION:  Gen: NAD, conversant, well nourised, well groomed                     Cardiovascular: Regular rate rhythm, no peripheral edema, warm, nontender. Eyes:  Conjunctivae clear without exudates or hemorrhage Neck: Supple, no carotid bruits. Pulmonary: Clear to auscultation bilaterally   NEUROLOGICAL EXAM:  MENTAL STATUS: Speech/cognition: Awake, alert, oriented to history taking care of conversation  CRANIAL NERVES: CN II: Visual fields are full to confrontation. Pupils are round equal and briskly reactive to light. CN III, IV, VI: extraocular movement are normal. No ptosis. CN V: Facial sensation is intact to light touch CN VII: Face is symmetric with normal eye closure  CN VIII: Hearing is normal to causal conversation. CN IX, X: Phonation is normal. CN XI: Head turning and shoulder shrug are intact  MOTOR: No significant weakness noted  REFLEXES: Reflexes are 2+ and symmetric at the biceps, triceps, knees, and ankles. Plantar responses are flexor.  SENSORY: Intact to light touch, pinprick and vibratory sensation are intact in fingers and toes.  COORDINATION: There is no trunk or limb dysmetria noted.  GAIT/STANCE: Need push-up to get up from seated position, cautious,  REVIEW OF SYSTEMS:  Full 14 system review of systems performed and notable only for as above All other review of systems were negative.   ALLERGIES: Allergies  Allergen Reactions   Bee Venom Swelling    HOME MEDICATIONS: Current Outpatient Medications  Medication Sig Dispense Refill   atorvastatin (LIPITOR) 40 MG tablet Take 1 tablet by mouth once daily 90 tablet 3   diltiazem (TIAZAC) 180 MG 24 hr capsule Take 1 capsule by mouth once daily 90 capsule 0   HYDROcodone-acetaminophen (NORCO) 10-325 MG tablet Take 1 tablet by mouth every 6 (six) hours as needed.     pantoprazole (PROTONIX) 40 MG tablet Take 1 tablet (40 mg total) by mouth daily. 90 tablet 0   pregabalin (LYRICA) 150 MG capsule Take 150 mg by mouth 2 (two) times daily.     Sodium Sulfate-Mag Sulfate-KCl (SUTAB) [phone] MG TABS Take 24 tablets by mouth as directed. SUTAB COUPON  WCB:[id number] PCN:CN DVVOH:[id number] MEMBER [id number]; no prior authorization 24 tablet 0   tadalafil (CIALIS) 20 MG tablet Take 20 mg by mouth daily as needed for erectile dysfunction.     No current facility-administered medications for this visit.    PAST MEDICAL HISTORY: Past Medical History:  Diagnosis Date   Anxiety    Arthritis    hips and lower back   BACK PAIN, LUMBAR 08/06/2009   Qualifier: Diagnosis of  By: Regino Schultze CMA (AAMA), Rachel     Blood transfusion without reported diagnosis    CHEST PAIN 05/06/2010   Qualifier: Diagnosis of  By: Sherren Mocha MD, Dellis Filbert A    Chronic chest wall pain 08/28/2015   Depression    Dysrhythmia    GERD (gastroesophageal reflux disease)    History of kidney stones    Hypertension    Incisional hernia    Mixed hyperlipidemia 09/17/2019   Neuralgia    Neuromuscular disorder ([place])    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, back , legs- + neuropathy   Numerous moles 01/19/2012   Shortened PR interval 08/01/2015   Supraventricular tachycardia ([place]) 09/17/2019    PAST SURGICAL HISTORY: Past Surgical History:  Procedure Laterality Date   APPENDECTOMY     BACK SURGERY     L3L4  fusion   CYSTOSCOPY/URETEROSCOPY/HOLMIUM LASER/STENT PLACEMENT Right 07/16/2020   Procedure: CYSTOSCOPY/URETEROSCOPY/HOLMIUM LASER/STENT PLACEMENT;  Surgeon: [name], MD;  Location: [facility];  Service: Urology;  Laterality: Right;   HERNIA REPAIR     INCISIONAL HERNIA REPAIR N/A 05/12/2021   Procedure: INCISIONAL HERNIA REPAIR WITH MESH;  Surgeon: Erroll Luna, MD;  Location: [place];  Service: General;  Laterality: N/A;   LAPAROSCOPIC GASTROTOMY W/ REPAIR OF ULCER     MASS EXCISION N/A 01/22/2020   Procedure: EXCISION OF XIPHOID;  Surgeon: Ivin Poot, MD;  Location: [facility];  Service: Thoracic;  Laterality: N/A;   right thoracotomy for exposure for the C7-T8 diskectomy  02/06/2007   Dr Arlyce Dice   TONSILLECTOMY      FAMILY HISTORY: Family  History  Problem Relation Age of Onset   Hypertension Mother    Asthma Mother    Healthy Sister    Healthy Brother    Other Brother        tumors   Colon polyps Maternal Uncle    Cancer Paternal Uncle        intestinal   Scoliosis Daughter    Healthy Son    Colon cancer Neg Hx    Esophageal cancer Neg Hx    Rectal cancer Neg Hx    Stomach cancer Neg Hx     SOCIAL HISTORY: Social History   Socioeconomic History   Marital status: Married    Spouse name: Not on file   Number of children: Not on file   Years of education: Not on file   Highest education level: Not on file  Occupational History    Employer:  [employer]  Tobacco Use   Smoking status: Former    Packs/day: 1.00    Years: 2.00    Pack years: 2.00    Types: Cigarettes    Quit date: 05/23/1997    Years since quitting: 24.3   Smokeless tobacco: Former  Scientific laboratory technician Use: Never used  Substance and Sexual Activity   Alcohol use: Yes    Alcohol/week: 6.0 standard drinks    Types: 6 Cans of beer per week    Comment: social   Drug use: Not Currently   Sexual activity: Yes  Other Topics Concern   Not on file  Social History Narrative   Not on file   Social Determinants of Health   Financial Resource Strain: Not on file  Food Insecurity: Not on file  Transportation Needs: Not on file  Physical Activity: Not on file  Stress: Not on file  Social Connections: Not on file  Intimate Partner Violence: Not on file      Marcial Pacas, M.D. Ph.D.  [facility] [address] [place], [place] [address] Ph: [phone] Fax: [phone]  CC:  [name], [place] [address],  [place] [address]  [name], [place]

## 2021-09-17 LAB — THYROID PANEL WITH TSH
Free Thyroxine Index: 2 (ref 1.2–4.9)
T3 Uptake Ratio: 25 % (ref 24–39)
T4, Total: 8 ug/dL (ref 4.5–12.0)
TSH: 1.38 u[IU]/mL (ref 0.450–4.500)

## 2021-09-17 LAB — RPR: RPR Ser Ql: NONREACTIVE

## 2021-09-17 LAB — CK: Total CK: 108 U/L (ref 49–439)

## 2021-09-20 ENCOUNTER — Telehealth: Payer: Self-pay | Admitting: Neurology

## 2021-09-20 NOTE — Telephone Encounter (Signed)
[employer] pending faxed notes 

## 2021-09-22 NOTE — Telephone Encounter (Signed)
VM is full sent mychart message to schedule at [facility] ?[employer] Josem Kaufmann: [id number] (exp. 09/20/21 to 11/04/21)  ?

## 2021-10-03 ENCOUNTER — Other Ambulatory Visit: Payer: Self-pay | Admitting: Family

## 2021-10-06 ENCOUNTER — Ambulatory Visit: Payer: 59 | Admitting: Student

## 2021-10-08 ENCOUNTER — Ambulatory Visit (HOSPITAL_COMMUNITY)
Admission: RE | Admit: 2021-10-08 | Discharge: 2021-10-08 | Disposition: A | Discharge status: Home / Self Care | Payer: 59 | Source: Ambulatory Visit | Attending: Student | Admitting: Student

## 2021-10-08 ENCOUNTER — Encounter: Payer: 59 | Admitting: Gastroenterology

## 2021-10-08 DIAGNOSIS — I471 Supraventricular tachycardia, unspecified: Secondary | ICD-10-CM

## 2021-10-08 DIAGNOSIS — F17210 Nicotine dependence, cigarettes, uncomplicated: Secondary | ICD-10-CM | POA: Insufficient documentation

## 2021-10-08 DIAGNOSIS — R202 Paresthesia of skin: Secondary | ICD-10-CM

## 2021-10-08 DIAGNOSIS — R9431 Abnormal electrocardiogram [ECG] [EKG]: Secondary | ICD-10-CM

## 2021-10-08 DIAGNOSIS — E782 Mixed hyperlipidemia: Secondary | ICD-10-CM

## 2021-10-08 DIAGNOSIS — R0601 Orthopnea: Secondary | ICD-10-CM

## 2021-10-08 DIAGNOSIS — G8929 Other chronic pain: Secondary | ICD-10-CM

## 2021-10-08 DIAGNOSIS — Z4889 Encounter for other specified surgical aftercare: Secondary | ICD-10-CM

## 2021-10-08 DIAGNOSIS — D229 Melanocytic nevi, unspecified: Secondary | ICD-10-CM

## 2021-10-08 DIAGNOSIS — R0789 Other chest pain: Secondary | ICD-10-CM

## 2021-10-08 DIAGNOSIS — R400 Somnolence: Secondary | ICD-10-CM

## 2021-10-08 DIAGNOSIS — M4726 Other spondylosis with radiculopathy, lumbar region: Secondary | ICD-10-CM

## 2021-10-08 DIAGNOSIS — R269 Unspecified abnormalities of gait and mobility: Secondary | ICD-10-CM

## 2021-10-08 DIAGNOSIS — M5126 Other intervertebral disc displacement, lumbar region: Secondary | ICD-10-CM

## 2021-10-08 DIAGNOSIS — M542 Cervicalgia: Secondary | ICD-10-CM

## 2021-10-08 LAB — PULMONARY FUNCTION TEST
DL/VA % pred: 94 %
DL/VA: 4.2 ml/min/mmHg/L
DLCO unc % pred: 72 %
DLCO unc: 21.2 ml/min/mmHg
FEF 25-75 Pre: 5.86 L/sec
FEF2575-%Pred-Pre: 168 %
FEV1-%Pred-Pre: 94 %
FEV1-Pre: 3.7 L
FEV1FVC-%Pred-Pre: 117 %
FEV6-%Pred-Pre: 83 %
FEV6-Pre: 4.06 L
FEV6FVC-%Pred-Pre: 103 %
FVC-%Pred-Pre: 80 %
FVC-Pre: 4.06 L
Pre FEV1/FVC ratio: 91 %
Pre FEV6/FVC Ratio: 100 %
RV % pred: 36 %
RV: 0.73 L
TLC % pred: 64 %
TLC: 4.51 L

## 2021-10-08 LAB — BLOOD GAS, ARTERIAL
Acid-Base Excess: 2.8 mmol/L — ABNORMAL HIGH (ref 0.0–2.0)
Bicarbonate: 27.2 mmol/L (ref 20.0–28.0)
Drawn by: 21179
O2 Saturation: 97.8 %
Patient temperature: 37
pCO2 arterial: 40 mmHg (ref 32–48)
pH, Arterial: 7.44 (ref 7.35–7.45)
pO2, Arterial: 78 mmHg — ABNORMAL LOW (ref 83–108)

## 2021-10-11 NOTE — Progress Notes (Unsigned)
Synopsis: Referred for hypoxia when lying down by [name],*  Subjective:   PATIENT ID: Kevin Jackson GENDER: male DOB: [birth year], MRN: [id number]  No chief complaint on file.  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   He had sleep study 06/04/20 due to PND episodes but only desaturation observed. He does feel dyspneic when he lies down still. He hasn't tried any swimming since all of this to know if he has any trouble breathing when water gets chest high. Some weight gain over the last year.   Does also note some dysphagia which has come on since his lumbar surgery repair.   Since 2008 has also had a chronic cough. He does have reflux, takes tums or rolaids. Doesn't have much in way of sinus congestion/postnasal drainage except over the last couple of days which he attributes to pollen.    He has no family history of lung disease  Worked in [employer]er, [employer] in past. Has always lived in [place]. Smoked in late 1990s for 1-2 years.   Interval HPI:  Otherwise pertinent review of systems is negative.   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, back , legs- + neuropathy   Numerous moles 01/19/2012   Shortened PR interval 08/01/2015   Supraventricular tachycardia ([place]) 09/17/2019     Family History  Problem Relation Age of Onset   Hypertension Mother    Asthma Mother    Healthy Sister    Healthy Brother    Other Brother        tumors   Colon polyps Maternal Uncle    Cancer Paternal Uncle        intestinal   Scoliosis Daughter    Healthy Son    Colon cancer Neg Hx    Esophageal cancer Neg Hx    Rectal cancer Neg Hx    Stomach cancer Neg Hx      Past Surgical History:  Procedure Laterality Date   APPENDECTOMY     BACK SURGERY     L3L4 fusion   CYSTOSCOPY/URETEROSCOPY/HOLMIUM LASER/STENT PLACEMENT Right 07/16/2020   Procedure: CYSTOSCOPY/URETEROSCOPY/HOLMIUM LASER/STENT PLACEMENT;  Surgeon: [name], MD;  Location: [facility];  Service: Urology;  Laterality: Right;   HERNIA REPAIR     INCISIONAL HERNIA REPAIR N/A 05/12/2021   Procedure: INCISIONAL HERNIA REPAIR WITH MESH;  Surgeon: Erroll Luna, MD;  Location: [facility];  Service: General;  Laterality: N/A;   LAPAROSCOPIC GASTROTOMY W/ REPAIR OF  ULCER     MASS EXCISION N/A 01/22/2020   Procedure: EXCISION OF XIPHOID;  Surgeon: Ivin Poot, MD;  Location: [facility] OR;  Service: Thoracic;  Laterality: N/A;   right thoracotomy for exposure for the C7-T8 diskectomy  02/06/2007   Dr Arlyce Dice   TONSILLECTOMY      Social History   Socioeconomic History   Marital status: Married    Spouse name: Not on file   Number of children: Not on file   Years of education: Not on file   Highest education level: Not on file  Occupational History    Employer: [employer]  Tobacco Use   Smoking status: Former    Packs/day: 1.00    Years: 2.00    Pack years: 2.00    Types: Cigarettes    Quit date: 05/23/1997    Years since quitting: 24.4    Smokeless tobacco: Former  Scientific laboratory technician Use: Never used  Substance and Sexual Activity   Alcohol use: Yes    Alcohol/week: 6.0 standard drinks    Types: 6 Cans of beer per week    Comment: social   Drug use: Not Currently   Sexual activity: Yes  Other Topics Concern   Not on file  Social History Narrative   Not on file   Social Determinants of Health   Financial Resource Strain: Not on file  Food Insecurity: Not on file  Transportation Needs: Not on file  Physical Activity: Not on file  Stress: Not on file  Social Connections: Not on file  Intimate Partner Violence: Not on file     Allergies  Allergen Reactions   Bee Venom Swelling     Outpatient Medications Prior to Visit  Medication Sig Dispense Refill   atorvastatin (LIPITOR) 40 MG tablet Take 1 tablet by mouth once daily 90 tablet 3   diltiazem (TIAZAC) 180 MG 24 hr capsule One tab daily 90 capsule 1   HYDROcodone-acetaminophen (NORCO) 10-325 MG tablet Take 1 tablet by mouth every 6 (six) hours as needed.     pantoprazole (PROTONIX) 40 MG tablet Take 1 tablet (40 mg total) by mouth daily. 90 tablet 0   pregabalin (LYRICA) 150 MG capsule Take 150 mg by mouth 2 (two) times daily.     Sodium Sulfate-Mag Sulfate-KCl (SUTAB) [phone] MG TABS Take 24 tablets by mouth as directed. SUTAB COUPON DJM:[id number] PCN:CN HDQQI:[id number] MEMBER [id number]; no prior authorization 24 tablet 0   tadalafil (CIALIS) 20 MG tablet Take 20 mg by mouth daily as needed for erectile dysfunction.     No facility-administered medications prior to visit.       Objective:   Physical Exam:  General appearance: 51 y.o., male, NAD, conversant, male, NAD, conversant  Eyes: anicteric sclerae; PERRL, tracking appropriately HENT: NCAT; MMM Neck: Trachea midline; no lymphadenopathy, no JVD Lungs: CTAB, no crackles, no wheeze, with normal respiratory effort CV: RRR, no murmur  Abdomen: Soft, non-tender; non-distended, BS present  Extremities: No  peripheral edema, warm Skin: Normal turgor and texture; no rash Psych: Appropriate affect Neuro: Alert and oriented to person and place, no focal deficit     There were no vitals filed for this visit.    on RA BMI Readings from Last 3 Encounters:  09/16/21 29.84 kg/m  09/09/21 29.99 kg/m  09/08/21 29.99 kg/m   Wt Readings from Last 3 Encounters:  09/16/21 208 lb (94.3 kg)  09/09/21 209 lb (94.8 kg)  09/08/21 209 lb (94.8 kg)     CBC  Component Value Date/Time   WBC 6.4 07/27/2021 1127   RBC 4.92 07/27/2021 1127   HGB 15.5 07/27/2021 1127   HGB 17.5 09/17/2019 0927   HCT 43.3 07/27/2021 1127   HCT 48.9 09/17/2019 0927   PLT 240.0 07/27/2021 1127   PLT 284 09/17/2019 0927   MCV 88.0 07/27/2021 1127   MCV 89 09/17/2019 0927   MCH 31.7 07/15/2020 0826   MCHC 35.8 07/27/2021 1127   RDW 13.6 07/27/2021 1127   RDW 13.1 09/17/2019 0927   LYMPHSABS 1.5 07/27/2021 1127   LYMPHSABS 2.1 09/17/2019 0927   MONOABS 0.7 07/27/2021 1127   EOSABS 0.1 07/27/2021 1127   EOSABS 0.1 09/17/2019 0927   BASOSABS 0.0 07/27/2021 1127   BASOSABS 0.1 09/17/2019 0927   ABG reviewed by me 7.44/40/78  Chest Imaging: CT Chest W contrast 12/2019 reviewed by me unremarkable  Pulmonary Functions Testing Results:    Latest Ref Rng & Units 10/08/2021    9:12 AM  PFT Results  FVC-Pre L 4.06  P  FVC-Predicted Pre % 80  P  Pre FEV1/FVC % % 91  P  FEV1-Pre L 3.70  P  FEV1-Predicted Pre % 94  P  DLCO uncorrected ml/min/mmHg 21.20  P  DLCO UNC% % 72  P  DLVA Predicted % 94  P  TLC L 4.51  P  TLC % Predicted % 64  P  RV % Predicted % 36  P    P Preliminary result   PFTs 10/08/21 reviewed by me remarkable for mild restriction, very mildly reduced diffusing capacity, MIP (-86) and MEP (96) above LLN for 50yM. FVC is just below normal (80% predicted). There is drop in FVC of 23.6% in supine position.  PSG 06/04/20: - No significant obstructive sleep apnea occurred during this study (AHI =  3.8/h). - No significant central sleep apnea occurred during this study (CAI = 0.0/h). - Mild oxygen desaturation was noted during this study (Min O2 = 87.0%). - The patient snored with moderate snoring volume. - No cardiac abnormalities were noted during this study. - Clinically significant periodic limb movements did not occur during sleep. No significant associated arousals.   Echocardiogram:   TTE 05/13/20:   1. Left ventricular ejection fraction, by estimation, is 65 to 70%. The  left ventricle has normal function.   2. The left ventricle has no regional wall motion abnormalities.   3. There is mild concentric left ventricular hypertrophy.   4. Left ventricular diastolic parameters were normal.   5. Right ventricular systolic function is normal. The right ventricular  size is normal.   6. The mitral valve is normal in structure. Trivial mitral valve  regurgitation.   7. The aortic valve is tricuspid. Aortic valve regurgitation is trivial.  No aortic stenosis is present.   8. Aortic dilatation noted. There is mild dilatation of the ascending  aorta, measuring 38 mm.   Heart Catheterization:     Assessment & Plan:   # Orthopnea # Supine hypoxia Concerned he may have diaphragmatic weakness related to his prior surgeries. He feels super tired during the day. If he has evidence of hypoventilation then I think he could be a candidate for nocturnal NIV.  # chronic cough Has history of PUD and does have active reflux, taking only rolaids/TUMS. Also mentions some dysphagia with food getting stuck at level of thoracic inlet but declines MBS.   Plan: - PFT supine and upright, MIP/MEP, abg - protonix 40 mg 30 min before breakfast  RTC 4 weeks to discuss   Maryjane Hurter, MD [facility] 10/11/2021 12:13 PM

## 2021-10-12 ENCOUNTER — Ambulatory Visit (INDEPENDENT_AMBULATORY_CARE_PROVIDER_SITE_OTHER): Payer: 59 | Admitting: Student

## 2021-10-12 ENCOUNTER — Encounter: Payer: Self-pay | Admitting: Gastroenterology

## 2021-10-12 ENCOUNTER — Encounter: Payer: Self-pay | Admitting: Student

## 2021-10-12 VITALS — BP 114/80 | HR 75 | Temp 98.3°F | Ht 70.0 in | Wt 208.6 lb

## 2021-10-12 DIAGNOSIS — R053 Chronic cough: Secondary | ICD-10-CM

## 2021-10-12 DIAGNOSIS — R0609 Other forms of dyspnea: Secondary | ICD-10-CM | POA: Diagnosis not present

## 2021-10-12 MED ORDER — FLUTICASONE PROPIONATE 50 MCG/ACT NA SUSP: 1.0000 | Freq: Every day | NASAL | 6 refills | Status: AC

## 2021-10-12 NOTE — Patient Instructions (Addendum)
-   you will be called to schedule high resolution CT Chest - Set up appointment today with one of our sleep/neuromuscular weakness specialists: the testing was kind of equivocal for this - Try clearing your nose of crusting following a shower or use of a neti pot, then flonase 2 sprays each nostril for first 2 weeks, then 1 spray each nostril daily for nasal stuffiness and cough

## 2021-10-13 ENCOUNTER — Encounter: Payer: Self-pay | Admitting: Gastroenterology

## 2021-10-15 ENCOUNTER — Encounter: Payer: 59 | Admitting: Gastroenterology

## 2021-10-15 NOTE — Telephone Encounter (Signed)
Patient is scheduled at GI

## 2021-10-20 ENCOUNTER — Institutional Professional Consult (permissible substitution): Payer: 59 | Admitting: Primary Care

## 2021-10-21 ENCOUNTER — Encounter: Payer: Self-pay | Admitting: Family

## 2021-10-21 ENCOUNTER — Encounter: Payer: Self-pay | Admitting: Neurology

## 2021-10-25 ENCOUNTER — Encounter: Payer: Self-pay | Admitting: Student

## 2021-10-25 ENCOUNTER — Ambulatory Visit
Admission: RE | Admit: 2021-10-25 | Discharge: 2021-10-25 | Disposition: A | Discharge status: Home / Self Care | Payer: 59 | Source: Ambulatory Visit | Attending: Neurology | Admitting: Neurology

## 2021-10-25 DIAGNOSIS — M542 Cervicalgia: Secondary | ICD-10-CM | POA: Diagnosis not present

## 2021-10-25 DIAGNOSIS — R269 Unspecified abnormalities of gait and mobility: Secondary | ICD-10-CM

## 2021-10-25 DIAGNOSIS — R202 Paresthesia of skin: Secondary | ICD-10-CM

## 2021-11-01 NOTE — Telephone Encounter (Signed)
Please give him a follow-up visit with nurse practitioner, for further evaluation,

## 2021-11-04 ENCOUNTER — Ambulatory Visit
Admission: RE | Admit: 2021-11-04 | Discharge: 2021-11-04 | Disposition: A | Discharge status: Home / Self Care | Payer: 59 | Source: Ambulatory Visit | Attending: Student | Admitting: Student

## 2021-11-04 DIAGNOSIS — R0609 Other forms of dyspnea: Secondary | ICD-10-CM

## 2021-11-05 ENCOUNTER — Institutional Professional Consult (permissible substitution): Payer: 59 | Admitting: Pulmonary Disease

## 2021-11-07 NOTE — Progress Notes (Unsigned)
Synopsis: Referred for hypoxia when lying down by [name],*  Subjective:   PATIENT ID: Kevin Jackson GENDER: male DOB: [birth year], MRN: [id number]  No chief complaint on file.  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   He had sleep study 06/04/20 due to PND episodes but only desaturation observed. He does feel dyspneic when he lies down still. He hasn't tried any swimming since all of this to know if he has any trouble breathing when water gets chest high. Some weight gain over the last year.   Does also note some dysphagia which has come on since his lumbar surgery repair.   Since 2008 has also had a chronic cough. He does have reflux, takes tums or rolaids. Doesn't have much in way of sinus congestion/postnasal drainage except over the last couple of days which he attributes to pollen.    He has no family history of lung disease  Did have covid-19 in 2021 or so. Wasn't hospitalized with it.   Worked in [employer], [employer] in past. Has always lived in [place]. Smoked in late 1990s for 1-2 years.   Interval HPI:  He is more tired than at last visit during the day. No change in orthopnea since last visit, still wakes periodically at night with dyspnea. Gets same periodic episodes during the day of feeling nasal stuffiness then being aware of having trouble breathing - needing to force himself to breathe.   Still bothered by cough and ppi only served to help with reflux, not cough.  PFTs equivocal for diaphragmatic weakness. MIP/MEP reassuringly normal but there was 23% decrease in  FVC with supine spirometry. No evidence of significant daytime hypercapnia ABG 7.44 pCO2 40 bicarb 27 (possible he may have a very low level chronic hypercapnia given mild alkalemia).   Otherwise pertinent review of systems is negative.   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, back , legs- + neuropathy   Numerous moles 01/19/2012   Shortened PR interval 08/01/2015   Supraventricular tachycardia ([place]) 09/17/2019     Family History  Problem Relation Age of Onset   Hypertension Mother    Asthma Mother    Healthy Sister    Healthy Brother    Other Brother        tumors   Colon polyps Maternal Uncle    Cancer Paternal Uncle        intestinal   Scoliosis Daughter    Healthy Son    Colon  cancer Neg Hx    Esophageal cancer Neg Hx    Rectal cancer Neg Hx    Stomach cancer Neg Hx      Past Surgical History:  Procedure Laterality Date   APPENDECTOMY     BACK SURGERY     L3L4 fusion   CYSTOSCOPY/URETEROSCOPY/HOLMIUM LASER/STENT PLACEMENT Right 07/16/2020   Procedure: CYSTOSCOPY/URETEROSCOPY/HOLMIUM LASER/STENT PLACEMENT;  Surgeon: [name], MD;  Location: [facility];  Service: Urology;  Laterality: Right;   HERNIA REPAIR     INCISIONAL HERNIA REPAIR N/A 05/12/2021   Procedure: INCISIONAL HERNIA REPAIR WITH MESH;  Surgeon: Erroll Luna, MD;  Location: [facility];  Service: General;  Laterality: N/A;   LAPAROSCOPIC  GASTROTOMY W/ REPAIR OF ULCER     MASS EXCISION N/A 01/22/2020   Procedure: EXCISION OF XIPHOID;  Surgeon: Ivin Poot, MD;  Location: [facility];  Service: Thoracic;  Laterality: N/A;   right thoracotomy for exposure for the C7-T8 diskectomy  02/06/2007   Dr Arlyce Dice   TONSILLECTOMY      Social History   Socioeconomic History   Marital status: Married    Spouse name: Not on file   Number of children: Not on file   Years of education: Not on file   Highest education level: Not on file  Occupational History    Employer: [employer]  Tobacco Use   Smoking status: Former    Packs/day: 1.00    Years: 2.00    Total pack years: 2.00    Types: Cigarettes    Quit date: 05/23/1997    Years since quitting: 24.4   Smokeless tobacco: Former  Scientific laboratory technician Use: Never used  Substance and Sexual Activity   Alcohol use: Yes    Alcohol/week: 6.0 standard drinks of alcohol    Types: 6 Cans of beer per week    Comment: social   Drug use: Not Currently   Sexual activity: Yes  Other Topics Concern   Not on file  Social History Narrative   Not on file   Social Determinants of Health   Financial Resource Strain: Not on file  Food Insecurity: Not on file  Transportation Needs: Not on file  Physical Activity: Not on file  Stress: Not on file  Social Connections: Not on file  Intimate Partner Violence: Not on file     Allergies  Allergen Reactions   Bee Venom Swelling     Outpatient Medications Prior to Visit  Medication Sig Dispense Refill   atorvastatin (LIPITOR) 40 MG tablet Take 1 tablet by mouth once daily 90 tablet 3   diltiazem (TIAZAC) 180 MG 24 hr capsule One tab daily 90 capsule 1   fluticasone (FLONASE) 50 MCG/ACT nasal spray Place 1 spray into both nostrils daily. 16 g 6   HYDROcodone-acetaminophen (NORCO) 10-325 MG tablet Take 1 tablet by mouth every 6 (six) hours as needed.     pantoprazole (PROTONIX) 40 MG tablet Take 1 tablet (40 mg total) by mouth daily. 90  tablet 0   pregabalin (LYRICA) 150 MG capsule Take 150 mg by mouth 2 (two) times daily.     Sodium Sulfate-Mag Sulfate-KCl (SUTAB) [phone] MG TABS Take 24 tablets by mouth as directed. SUTAB COUPON SEG:[id number] PCN:CN HYWVP:[id number] MEMBER [id number]; no prior authorization 24 tablet 0   tadalafil (CIALIS) 20 MG tablet Take 20 mg by mouth daily as needed for erectile dysfunction.     No facility-administered medications prior to visit.  Objective:   Physical Exam:  General appearance: 51 y.o., male, NAD, conversant  Eyes: anicteric sclerae; PERRL, tracking appropriately HENT: NCAT; MMM Neck: Trachea midline; no lymphadenopathy, no JVD Lungs: CTAB, no crackles, no wheeze, with normal respiratory effort CV: RRR, no murmur  Abdomen: Soft, non-tender; non-distended, BS present  Extremities: No peripheral edema, warm Skin: Normal turgor and texture; no rash Psych: Appropriate affect Neuro: Alert and oriented to person and place, no focal deficit     There were no vitals filed for this visit.     on RA BMI Readings from Last 3 Encounters:  10/12/21 29.93 kg/m  09/16/21 29.84 kg/m  09/09/21 29.99 kg/m   Wt Readings from Last 3 Encounters:  10/12/21 208 lb 9.6 oz (94.6 kg)  09/16/21 208 lb (94.3 kg)  09/09/21 209 lb (94.8 kg)     CBC    Component Value Date/Time   WBC 6.4 07/27/2021 1127   RBC 4.92 07/27/2021 1127   HGB 15.5 07/27/2021 1127   HGB 17.5 09/17/2019 0927   HCT 43.3 07/27/2021 1127   HCT 48.9 09/17/2019 0927   PLT 240.0 07/27/2021 1127   PLT 284 09/17/2019 0927   MCV 88.0 07/27/2021 1127   MCV 89 09/17/2019 0927   MCH 31.7 07/15/2020 0826   MCHC 35.8 07/27/2021 1127   RDW 13.6 07/27/2021 1127   RDW 13.1 09/17/2019 0927   LYMPHSABS 1.5 07/27/2021 1127   LYMPHSABS 2.1 09/17/2019 0927   MONOABS 0.7 07/27/2021 1127   EOSABS 0.1 07/27/2021 1127   EOSABS 0.1 09/17/2019 0927   BASOSABS 0.0 07/27/2021 1127   BASOSABS 0.1 09/17/2019 0927    ABG reviewed by me 7.44/40/78  Chest Imaging: CT Chest W contrast 12/2019 reviewed by me unremarkable  HRCT Chest 10/08/21 with possible mild air trapping  Pulmonary Functions Testing Results:    Latest Ref Rng & Units 10/08/2021    9:12 AM  PFT Results  FVC-Pre L 4.06   FVC-Predicted Pre % 80   Pre FEV1/FVC % % 91   FEV1-Pre L 3.70   FEV1-Predicted Pre % 94   DLCO uncorrected ml/min/mmHg 21.20   DLCO UNC% % 72   DLVA Predicted % 94   TLC L 4.51   TLC % Predicted % 64   RV % Predicted % 36    PFTs 10/08/21 reviewed by me remarkable for mild restriction, very mildly reduced diffusing capacity, MIP (-86) and MEP (96) above LLN for 50yM. FVC is just below normal (80% predicted). There is drop in FVC of 23.6% in supine position.  PSG 06/04/20: - No significant obstructive sleep apnea occurred during this study (AHI = 3.8/h). - No significant central sleep apnea occurred during this study (CAI = 0.0/h). - Mild oxygen desaturation was noted during this study (Min O2 = 87.0%). - The patient snored with moderate snoring volume. - No cardiac abnormalities were noted during this study. - Clinically significant periodic limb movements did not occur during sleep. No significant associated arousals.   Echocardiogram:   TTE 05/13/20:   1. Left ventricular ejection fraction, by estimation, is 65 to 70%. The  left ventricle has normal function.   2. The left ventricle has no regional wall motion abnormalities.   3. There is mild concentric left ventricular hypertrophy.   4. Left ventricular diastolic parameters were normal.   5. Right ventricular systolic function is normal. The right ventricular  size is normal.   6. The mitral valve is normal in structure. Trivial mitral valve  regurgitation.   7. The aortic valve is tricuspid. Aortic valve regurgitation is trivial.  No aortic stenosis is present.   8. Aortic dilatation noted. There is mild dilatation of the ascending  aorta,  measuring 38 mm.   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 23.6% drop however in FVC when supine does raise question of early/mild diaphragmatic weakness. No evidence of significant daytime hypercapnia ABG 7.44 pCO2 40 bicarb 27 (possible he may have a very low level chronic hypercapnia given mild alkalemia). Although suspicion is low for significant component of parenchymal lung disease given only very mildly reduced diffusing capacity, his KCO is not elevated out of proportion to his reduced alveolar ventilation to extent I'd expect if decreased DLCO was solely due to NM disease or other cause alveolar hypoventilation, and the patient wishes to turn over every stone we can to make sure we're not missing something contributing to his dyspnea - we'll check HRCT Chest.   # chronic cough Does have more sinus congestion/postnasal drainage at this visit. Reflux improved with ppi but not cough. Also mentions some dysphagia with food getting stuck at level of thoracic inlet but has declined MBS.   Plan: - he agrees to arrange visit with one of our sleep/neuromuscular specialists. Not sure if it would be worth pursuing PSG with TCO2 monitoring or if there's much here that genuinely raises concern for diaphragmatic weakness on the whole. - HRCT Chest ordered - continue protonix 40 mg 30 min before breakfast  RTC 4 weeks to discuss   Maryjane Hurter, MD [facility] 11/07/2021 6:46 AM

## 2021-11-09 ENCOUNTER — Encounter: Payer: Self-pay | Admitting: Student

## 2021-11-09 ENCOUNTER — Ambulatory Visit (INDEPENDENT_AMBULATORY_CARE_PROVIDER_SITE_OTHER): Payer: 59 | Admitting: Student

## 2021-11-09 VITALS — BP 120/86 | HR 74 | Temp 98.2°F | Ht 70.0 in | Wt 208.0 lb

## 2021-11-09 DIAGNOSIS — R053 Chronic cough: Secondary | ICD-10-CM | POA: Diagnosis not present

## 2021-11-09 DIAGNOSIS — R0609 Other forms of dyspnea: Secondary | ICD-10-CM

## 2021-11-09 NOTE — Patient Instructions (Addendum)
-   Set up appointment today with one of our sleep/neuromuscular weakness specialists: the testing was kind of equivocal for this - Would stick with attempt at clearing your nose of crusting following a shower or use of a neti pot, then flonase 2 sprays each nostril for first 2 weeks, then 1 spray each nostril daily for nasal stuffiness and cough

## 2021-11-16 ENCOUNTER — Encounter: Payer: Self-pay | Admitting: Neurology

## 2021-11-16 ENCOUNTER — Ambulatory Visit: Payer: 59 | Admitting: Neurology

## 2021-11-16 VITALS — BP 136/95 | HR 74 | Ht 70.0 in | Wt 206.0 lb

## 2021-11-16 DIAGNOSIS — R269 Unspecified abnormalities of gait and mobility: Secondary | ICD-10-CM

## 2021-11-16 DIAGNOSIS — M545 Low back pain, unspecified: Secondary | ICD-10-CM

## 2021-11-16 DIAGNOSIS — R202 Paresthesia of skin: Secondary | ICD-10-CM

## 2021-11-17 ENCOUNTER — Telehealth: Payer: Self-pay | Admitting: Neurology

## 2021-11-17 LAB — BASIC METABOLIC PANEL
BUN/Creatinine Ratio: 17 (ref 9–20)
BUN: 16 mg/dL (ref 6–24)
CO2: 23 mmol/L (ref 20–29)
Calcium: 9.7 mg/dL (ref 8.7–10.2)
Chloride: 104 mmol/L (ref 96–106)
Creatinine, Ser: 0.93 mg/dL (ref 0.76–1.27)
Glucose: 92 mg/dL (ref 70–99)
Potassium: 4.2 mmol/L (ref 3.5–5.2)
Sodium: 141 mmol/L (ref 134–144)
eGFR: 100 mL/min/{1.73_m2} (ref >59)

## 2021-11-17 NOTE — Telephone Encounter (Signed)
[employer] Auth: [id number] exp. 11/17/21-01/01/22 sent to GI to get scheduled asap

## 2021-11-18 ENCOUNTER — Encounter: Payer: Self-pay | Admitting: Family

## 2021-11-18 ENCOUNTER — Telehealth: Payer: Self-pay | Admitting: Cardiology

## 2021-11-18 NOTE — Telephone Encounter (Signed)
Spoke to patient he stated he is having a back stimulator put in 7/3.He needs surgical clearance.Advised he will need appointment, his last appointment 04/14/20.Appointment scheduled with Almyra Deforest PA 11/22/21 at 2:45 pm.

## 2021-11-18 NOTE — Telephone Encounter (Signed)
Pt called requesting an appt for Pre Op Clearance for a procedure that is scheduled for Monday 7/3. I advised pt that we would need a Clearance sent over by the office that is performing procedure. Please advise

## 2021-11-19 ENCOUNTER — Encounter: Payer: Self-pay | Admitting: Family

## 2021-11-19 ENCOUNTER — Other Ambulatory Visit: Payer: Self-pay | Admitting: Family

## 2021-11-19 ENCOUNTER — Ambulatory Visit: Payer: 59 | Admitting: Family

## 2021-11-19 VITALS — BP 132/85 | HR 81 | Temp 98.2°F | Resp 16 | Ht 70.0 in | Wt 200.0 lb

## 2021-11-19 DIAGNOSIS — Z01818 Encounter for other preprocedural examination: Secondary | ICD-10-CM | POA: Diagnosis not present

## 2021-11-19 NOTE — Progress Notes (Signed)
Kevin Jackson is a 51 y.o. male with the following history as recorded in EpicCare:  Patient Active Problem List   Diagnosis Date Noted   Paresthesia 09/16/2021   Neck pain 09/16/2021   Gait abnormality 09/16/2021   Other spondylosis with radiculopathy, lumbar region 08/25/2020   Daytime somnolence 06/04/2020   Encounter for post surgical wound check 01/29/2020   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   Mixed hyperlipidemia 09/17/2019   Supraventricular tachycardia ([place]) 09/17/2019   Chronic chest wall pain 08/28/2015   Shortened PR interval 08/01/2015   Numerous moles 01/19/2012   Hypertension 03/17/2011   CHEST PAIN 05/06/2010   BACK PAIN, LUMBAR 08/06/2009    Current Outpatient Medications  Medication Sig Dispense Refill   atorvastatin (LIPITOR) 40 MG tablet Take 1 tablet by mouth once daily 90 tablet 3   diltiazem (TIAZAC) 180 MG 24 hr capsule One tab daily 90 capsule 1   fluticasone (FLONASE) 50 MCG/ACT nasal spray Place 1 spray into both nostrils daily. 16 g 6   HYDROcodone-acetaminophen (NORCO) 10-325 MG tablet Take 1 tablet by mouth every 6 (six) hours as needed.     pantoprazole (PROTONIX) 40 MG tablet Take 1 tablet (40 mg total) by mouth daily. 90 tablet 0   pregabalin (LYRICA) 150 MG capsule Take 150 mg by mouth 2 (two) times daily.     Sodium Sulfate-Mag Sulfate-KCl (SUTAB) [phone] MG TABS Take 24 tablets by mouth as directed. SUTAB COUPON SAY:[id number] PCN:CN UXNAT:[id number] MEMBER [id number]; no prior authorization 24 tablet 0   tadalafil (CIALIS) 20 MG tablet Take 20 mg by mouth daily as needed for erectile dysfunction.     No current facility-administered medications for this visit.    Allergies: Bee venom  Past Medical History:  Diagnosis Date   Anxiety    Arthritis    hips and lower back   BACK PAIN, LUMBAR 08/06/2009   Qualifier: Diagnosis of  By: Regino Schultze CMA (AAMA), Rachel     Blood transfusion  without reported diagnosis    CHEST PAIN 05/06/2010   Qualifier: Diagnosis of  By: Sherren Mocha MD, Dellis Filbert A    Chronic chest wall pain 08/28/2015   Depression    Dysrhythmia    GERD (gastroesophageal reflux disease)    History of kidney stones    Hypertension    Incisional hernia    Mixed hyperlipidemia 09/17/2019   Neuralgia    Neuromuscular disorder ([place])    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, back , legs- + neuropathy   Numerous moles 01/19/2012   Shortened PR interval 08/01/2015   Supraventricular tachycardia ([place]) 09/17/2019    Past Surgical History:  Procedure Laterality Date   APPENDECTOMY     BACK SURGERY     L3L4 fusion   CYSTOSCOPY/URETEROSCOPY/HOLMIUM LASER/STENT PLACEMENT Right 07/16/2020   Procedure: CYSTOSCOPY/URETEROSCOPY/HOLMIUM LASER/STENT PLACEMENT;  Surgeon: [name], MD;  Location: [facility];  Service: Urology;  Laterality: Right;   HERNIA REPAIR     INCISIONAL HERNIA REPAIR N/A 05/12/2021   Procedure: INCISIONAL HERNIA REPAIR WITH MESH;  Surgeon: Erroll Luna, MD;  Location: [place];  Service: General;  Laterality: N/A;   LAPAROSCOPIC GASTROTOMY W/ REPAIR OF ULCER     MASS EXCISION N/A 01/22/2020   Procedure: EXCISION OF XIPHOID;  Surgeon: Ivin Poot, MD;  Location: [facility];  Service: Thoracic;  Laterality: N/A;   right thoracotomy for exposure for the C7-T8 diskectomy  02/06/2007   Dr Arlyce Dice   TONSILLECTOMY  Family History  Problem Relation Age of Onset   Hypertension Mother    Asthma Mother    Healthy Sister    Healthy Brother    Other Brother        tumors   Colon polyps Maternal Uncle    Cancer Paternal Uncle        intestinal   Scoliosis Daughter    Healthy Son    Colon cancer Neg Hx    Esophageal cancer Neg Hx    Rectal cancer Neg Hx    Stomach cancer Neg Hx     Social History   Tobacco Use   Smoking status: Former    Packs/day: 1.00    Years: 2.00    Total pack years: 2.00    Types: Cigarettes    Quit date:  05/23/1997    Years since quitting: 24.5   Smokeless tobacco: Former  Substance Use Topics   Alcohol use: Yes    Alcohol/week: 6.0 standard drinks of alcohol    Types: 6 Cans of beer per week    Comment: social    Subjective:  Patient will be having surgical procedure on Monday to have back stimulator placed; needs updated EKG as part of pre-surgical clearance; No acute concerns today; is optimistic that procedure will help offer good pain relief.      Objective:  Vitals:   11/19/21 1449  BP: 132/85  Pulse: 81  Resp: 16  Temp: 98.2 F (36.8 C)  TempSrc: Oral  SpO2: 96%  Weight: 200 lb (90.7 kg)  Height: '5\' 10"'$  (1.778 m)    General: Well developed, well nourished, in no acute distress  Skin : Warm and dry.  Head: Normocephalic and atraumatic  Lungs: Respirations unlabored;  Neurologic: Alert and oriented; speech intact; face symmetrical; uses cane;  Assessment:  1. Pre-operative clearance     Plan:  EKG shows sinus rhythm; cleared for surgery with no limitations; EKG and letter faxed to surgical center as requested/ patient is also given copy of paperwork.   No follow-ups on file.  Orders Placed This Encounter  Procedures   EKG 12-Lead    Requested Prescriptions    No prescriptions requested or ordered in this encounter

## 2021-11-20 ENCOUNTER — Ambulatory Visit
Admission: RE | Admit: 2021-11-20 | Discharge: 2021-11-20 | Disposition: A | Discharge status: Home / Self Care | Payer: 59 | Source: Ambulatory Visit | Attending: Neurology | Admitting: Neurology

## 2021-11-20 DIAGNOSIS — R202 Paresthesia of skin: Secondary | ICD-10-CM

## 2021-11-20 MED ORDER — GADOBENATE DIMEGLUMINE 529 MG/ML IV SOLN
19.0000 mL | Freq: Once | INTRAVENOUS | Status: AC | PRN
  Administered 2021-11-20: 19 mL via INTRAVENOUS

## 2021-11-22 ENCOUNTER — Ambulatory Visit: Payer: 59 | Admitting: Physician Assistant

## 2021-12-06 ENCOUNTER — Institutional Professional Consult (permissible substitution): Payer: 59 | Admitting: Pulmonary Disease

## 2021-12-08 ENCOUNTER — Ambulatory Visit: Payer: 59 | Admitting: Student

## 2021-12-20 ENCOUNTER — Encounter: Payer: Self-pay | Admitting: Gastroenterology

## 2022-01-05 ENCOUNTER — Other Ambulatory Visit: Payer: Self-pay | Admitting: Student

## 2022-01-06 ENCOUNTER — Other Ambulatory Visit: Payer: Self-pay

## 2022-01-06 ENCOUNTER — Ambulatory Visit (AMBULATORY_SURGERY_CENTER): Payer: Self-pay | Admitting: *Deleted

## 2022-01-06 VITALS — Ht 70.0 in | Wt 211.0 lb

## 2022-01-06 DIAGNOSIS — Z1211 Encounter for screening for malignant neoplasm of colon: Secondary | ICD-10-CM

## 2022-01-06 NOTE — Progress Notes (Signed)
Pre visit completed in person Patient had Sutab from previously cancelled colonoscopy.  No egg or soy allergy known to patient  No issues known to pt with past sedation with any surgeries or procedures Patient denies ever being told they had issues or difficulty with intubation  No FH of Malignant Hyperthermia Pt is not on diet pills Pt is not on  home 02  Pt is not on blood thinners  Pt denies issues with constipation  No A fib or A flutter Pt instructed to use Singlecare.com or GoodRx for a price reduction on prep

## 2022-01-13 ENCOUNTER — Encounter: Payer: Self-pay | Admitting: Gastroenterology

## 2022-01-27 ENCOUNTER — Ambulatory Visit (AMBULATORY_SURGERY_CENTER): Payer: 59 | Admitting: Gastroenterology

## 2022-01-27 ENCOUNTER — Encounter: Payer: Self-pay | Admitting: Gastroenterology

## 2022-01-27 VITALS — BP 116/77 | HR 59 | Temp 98.4°F | Resp 12 | Ht 70.0 in | Wt 211.0 lb

## 2022-01-27 DIAGNOSIS — D123 Benign neoplasm of transverse colon: Secondary | ICD-10-CM

## 2022-01-27 DIAGNOSIS — Z1211 Encounter for screening for malignant neoplasm of colon: Secondary | ICD-10-CM | POA: Diagnosis present

## 2022-01-27 DIAGNOSIS — D120 Benign neoplasm of cecum: Secondary | ICD-10-CM

## 2022-01-27 DIAGNOSIS — D125 Benign neoplasm of sigmoid colon: Secondary | ICD-10-CM

## 2022-01-27 MED ORDER — SODIUM CHLORIDE 0.9 % IV SOLN: 500.0000 mL | Freq: Once | INTRAVENOUS | Status: DC

## 2022-01-27 NOTE — Progress Notes (Signed)
VS completed by DT.  Pt's states no medical or surgical changes since previsit or office visit.   Patient has back stimulator - it's turned off per pt

## 2022-01-27 NOTE — Patient Instructions (Signed)
Thank you for letting us take care of your healthcare needs today. PLease see handouts given to you on Polyps and Hemorrhoids.    YOU HAD AN ENDOSCOPIC PROCEDURE TODAY AT [facility]:   Refer to the procedure report that was given to you for any specific questions about what was found during the examination.  If the procedure report does not answer your questions, please call your gastroenterologist to clarify.  If you requested that your care partner not be given the details of your procedure findings, then the procedure report has been included in a sealed envelope for you to review at your convenience later.  YOU SHOULD EXPECT: Some feelings of bloating in the abdomen. Passage of more gas than usual.  Walking can help get rid of the air that was put into your GI tract during the procedure and reduce the bloating. If you had a lower endoscopy (such as a colonoscopy or flexible sigmoidoscopy) you may notice spotting of blood in your stool or on the toilet paper. If you underwent a bowel prep for your procedure, you may not have a normal bowel movement for a few days.  Please Note:  You might notice some irritation and congestion in your nose or some drainage.  This is from the oxygen used during your procedure.  There is no need for concern and it should clear up in a day or so.  SYMPTOMS TO REPORT IMMEDIATELY:  Following lower endoscopy (colonoscopy or flexible sigmoidoscopy):  Excessive amounts of blood in the stool  Significant tenderness or worsening of abdominal pains  Swelling of the abdomen that is new, acute  Fever of 100F or higher   For urgent or emergent issues, a gastroenterologist can be reached at any hour by calling [phone]. Do not use MyChart messaging for urgent concerns.    DIET:  We do recommend a small meal at first, but then you may proceed to your regular diet.  Drink plenty of fluids but you should avoid alcoholic beverages for 24  hours.  ACTIVITY:  You should plan to take it easy for the rest of today and you should NOT DRIVE or use heavy machinery until tomorrow (because of the sedation medicines used during the test).    FOLLOW UP: Our staff will call the number listed on your records the next business day following your procedure.  We will call around 7:15- 8:00 am to check on you and address any questions or concerns that you may have regarding the information given to you following your procedure. If we do not reach you, we will leave a message.  If you develop any symptoms (ie: fever, flu-like symptoms, shortness of breath, cough etc.) before then, please call [phone].  If you test positive for Covid 19 in the 2 weeks post procedure, please call and report this information to [place].    If any biopsies were taken you will be contacted by phone or by letter within the next 1-3 weeks.  Please call us at [phone] if you have not heard about the biopsies in 3 weeks.    SIGNATURES/CONFIDENTIALITY: You and/or your care partner have signed paperwork which will be entered into your electronic medical record.  These signatures attest to the fact that that the information above on your After Visit Summary has been reviewed and is understood.  Full responsibility of the confidentiality of this discharge information lies with you and/or your care-partner.

## 2022-01-27 NOTE — Progress Notes (Signed)
[facility] History and Physical   Primary Care Physician:  [name], [place]   Reason for Procedure:   Colon cancer screening  Plan:    Screening colonoscopy     HPI: Kevin Jackson is a 51 y.o. male undergoing initial average risk screening colonoscopy.  He has no family history of colon cancer and no chronic GI symptoms, other than occasional perianal discomfort and bright red blood per rectum, presumed to be hemorrhoids.    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, back , legs- + neuropathy   Numerous moles 01/19/2012   Shortened PR interval 08/01/2015   Supraventricular tachycardia ([place]) 09/17/2019    Past Surgical History:  Procedure Laterality Date   APPENDECTOMY     BACK SURGERY     L3L4 fusion   CYSTOSCOPY/URETEROSCOPY/HOLMIUM LASER/STENT PLACEMENT Right 07/16/2020   Procedure: CYSTOSCOPY/URETEROSCOPY/HOLMIUM LASER/STENT PLACEMENT;  Surgeon: [name], MD;  Location: [facility];  Service: Urology;  Laterality: Right;   HERNIA REPAIR     INCISIONAL HERNIA REPAIR N/A 05/12/2021   Procedure: INCISIONAL HERNIA REPAIR WITH MESH;  Surgeon: Erroll Luna, MD;  Location: [place] [facility];  Service: General;  Laterality: N/A;   LAPAROSCOPIC GASTROTOMY W/ REPAIR OF ULCER     MASS EXCISION N/A 01/22/2020   Procedure: EXCISION OF XIPHOID;  Surgeon: Ivin Poot, MD;  Location: [facility];   Service: Thoracic;  Laterality: N/A;   pain stimulator  2023   right thoracotomy for exposure for the C7-T8 diskectomy  02/06/2007   Dr Arlyce Dice   TONSILLECTOMY      Prior to Admission medications   Medication Sig Start Date End Date Taking? Authorizing Provider  atorvastatin (LIPITOR) 40 MG tablet Take 1 tablet by mouth once daily 07/30/21  Yes [name], FNP  diltiazem [facility]) 180 MG 24 hr capsule One tab daily 10/04/21  Yes [name], FNP  HYDROcodone-acetaminophen [facility]) 10-325 MG tablet Take 1 tablet by mouth every 6 (six) hours as needed. 08/12/21  Yes [provider]  pantoprazole (PROTONIX) 40 MG tablet Take 1 tablet by mouth once daily 01/06/22  Yes Meier, Hortencia Conradi, MD  pregabalin (LYRICA) 150 MG capsule Take 150 mg by mouth 2 (two) times daily.   Yes [provider]  fluticasone (FLONASE) 50 MCG/ACT nasal spray Place 1 spray into both nostrils daily. 10/12/21   Maryjane Hurter, MD  tadalafil (CIALIS) 20 MG tablet Take 20 mg by mouth daily as needed for erectile dysfunction.    [provider]  Gabapentin, PHN, 300 MG TABS Take 1 capsule by mouth 3 (three) times daily. 01/20/11 02/15/12  Dorena Cookey, MD    Current Outpatient Medications  Medication Sig Dispense Refill   atorvastatin (LIPITOR) 40 MG tablet Take 1 tablet by mouth once daily 90 tablet 3  diltiazem (TIAZAC) 180 MG 24 hr capsule One tab daily 90 capsule 1   HYDROcodone-acetaminophen (NORCO) 10-325 MG tablet Take 1 tablet by mouth every 6 (six) hours as needed.     pantoprazole (PROTONIX) 40 MG tablet Take 1 tablet by mouth once daily 90 tablet 0   pregabalin (LYRICA) 150 MG capsule Take 150 mg by mouth 2 (two) times daily.     fluticasone (FLONASE) 50 MCG/ACT nasal spray Place 1 spray into both nostrils daily. 16 g 6   tadalafil (CIALIS) 20 MG tablet Take 20 mg by mouth daily as needed for erectile dysfunction.     Current Facility-Administered Medications   Medication Dose Route Frequency Provider Last Rate Last Admin   0.9 %  sodium chloride infusion  500 mL Intravenous Once Daryel November, MD        Allergies as of 01/27/2022 - Review Complete 01/27/2022  Allergen Reaction Noted   Bee venom Swelling 04/17/2021    Family History  Problem Relation Age of Onset   Hypertension Mother    Asthma Mother    Healthy Sister    Healthy Brother    Other Brother        tumors   Colon polyps Maternal Uncle    Cancer Paternal Uncle        intestinal   Scoliosis Daughter    Healthy Son    Colon cancer Neg Hx    Esophageal cancer Neg Hx    Rectal cancer Neg Hx    Stomach cancer Neg Hx     Social History   Socioeconomic History   Marital status: Married    Spouse name: Not on file   Number of children: Not on file   Years of education: Not on file   Highest education level: Not on file  Occupational History    Employer: [employer]  Tobacco Use   Smoking status: Former    Packs/day: 1.00    Years: 2.00    Total pack years: 2.00    Types: Cigarettes    Quit date: 05/23/1997    Years since quitting: 24.6   Smokeless tobacco: Former  Scientific laboratory technician Use: Never used  Substance and Sexual Activity   Alcohol use: Yes    Alcohol/week: 6.0 standard drinks of alcohol    Types: 6 Cans of beer per week    Comment: social   Drug use: Not Currently   Sexual activity: Yes  Other Topics Concern   Not on file  Social History Narrative   Not on file   Social Determinants of Health   Financial Resource Strain: Not on file  Food Insecurity: Not on file  Transportation Needs: Not on file  Physical Activity: Not on file  Stress: Not on file  Social Connections: Not on file  Intimate Partner Violence: Not on file    Review of Systems:  All other review of systems negative except as mentioned in the HPI.  Physical Exam: Vital signs BP 107/65   Pulse 75   Temp 98.4 F (36.9 C) (Temporal)   Ht '5\' 10"'$  (1.778 m)   Wt  211 lb (95.7 kg)   SpO2 97%   BMI 30.28 kg/m   General:   Alert,  Well-developed, well-nourished, pleasant and cooperative in NAD Airway:  Mallampati 2 Lungs:  Clear throughout to auscultation.   Heart:  Regular rate and rhythm; no murmurs, clicks, rubs,  or gallops. Abdomen:  Soft, nontender and nondistended. Normal bowel sounds.  Neuro/Psych:  Normal mood and affect. A and O x 3   Ivee Poellnitz E. Candis Schatz, MD [facility]

## 2022-01-27 NOTE — Progress Notes (Signed)
Sedate, gd SR, tolerated procedure well, VSS, report to RN 

## 2022-01-27 NOTE — Op Note (Signed)
[place] Patient Name: Kevin Jackson Procedure Date: 01/27/2022 10:34 AM MRN: [id number] Endoscopist: Nicki Reaper E. Candis Schatz , MD Age: 51 Referring MD:  Date of Birth: 1970/05/26 Gender: Male Account #: [id number] Procedure:                Colonoscopy Indications:              Screening for colorectal malignant neoplasm, This                            is the patient's first colonoscopy Medicines:                Monitored Anesthesia Care Procedure:                Pre-Anesthesia Assessment:                           - Prior to the procedure, a History and Physical                            was performed, and patient medications and                            allergies were reviewed. The patient's tolerance of                            previous anesthesia was also reviewed. The risks                            and benefits of the procedure and the sedation                            options and risks were discussed with the patient.                            All questions were answered, and informed consent                            was obtained. Prior Anticoagulants: The patient has                            taken no previous anticoagulant or antiplatelet                            agents. ASA Grade Assessment: II - A patient with                            mild systemic disease. After reviewing the risks                            and benefits, the patient was deemed in                            satisfactory condition to undergo the procedure.  After obtaining informed consent, the colonoscope                            was passed under direct vision. Throughout the                            procedure, the patient's blood pressure, pulse, and                            oxygen saturations were monitored continuously. The                            Olympus CF-HQ190L [id number]) Colonoscope was                            introduced through  the anus and advanced to the the                            terminal ileum, with identification of the                            appendiceal orifice and IC valve. The colonoscopy                            was performed without difficulty. The patient                            tolerated the procedure well. The quality of the                            bowel preparation was good. The terminal ileum,                            ileocecal valve, appendiceal orifice, and rectum                            were photographed. The bowel preparation used was                            SUPREP via split dose instruction. Scope In: 10:40:27 AM Scope Out: 11:00:50 AM Scope Withdrawal Time: 0 hours 16 minutes 20 seconds  Total Procedure Duration: 0 hours 20 minutes 23 seconds  Findings:                 The perianal and digital rectal examinations were                            normal. Pertinent negatives include normal                            sphincter tone and no palpable rectal lesions.                           A 13 mm polyp was found in the cecum. The polyp was  flat. The polyp was removed with a cold snare.                            Resection and retrieval were complete. Estimated                            blood loss was minimal.                           A 8 mm polyp was found in the transverse colon. The                            polyp was sessile. The polyp was removed with a                            cold snare. Resection and retrieval were complete.                            Estimated blood loss was minimal.                           A 5 mm polyp was found in the transverse colon. The                            polyp was sessile. The polyp was removed with a                            cold snare. Resection and retrieval were complete.                            Estimated blood loss was minimal.                           A 5 mm polyp was found in the sigmoid  colon. The                            polyp was flat. The polyp was removed with a cold                            snare. Resection and retrieval were complete.                            Estimated blood loss was minimal.                           The exam was otherwise normal throughout the                            examined colon.                           The terminal ileum appeared normal.  Non-bleeding internal hemorrhoids were found during                            retroflexion. The hemorrhoids were Grade II                            (internal hemorrhoids that prolapse but reduce                            spontaneously).                           No additional abnormalities were found on                            retroflexion. Complications:            No immediate complications. Estimated Blood Loss:     Estimated blood loss was minimal. Impression:               - One 13 mm polyp in the cecum, removed with a cold                            snare. Resected and retrieved.                           - One 8 mm polyp in the transverse colon, removed                            with a cold snare. Resected and retrieved.                           - One 5 mm polyp in the transverse colon, removed                            with a cold snare. Resected and retrieved.                           - One 5 mm polyp in the sigmoid colon, removed with                            a cold snare. Resected and retrieved.                           - The examined portion of the ileum was normal.                           - Non-bleeding internal hemorrhoids. Recommendation:           - Patient has a contact number available for                            emergencies. The signs and symptoms of potential  delayed complications were discussed with the                            patient. Return to normal activities tomorrow.                             Written discharge instructions were provided to the                            patient.                           - Resume previous diet.                           - Continue present medications.                           - Await pathology results.                           - Repeat colonoscopy (date not yet determined) for                            surveillance based on pathology results. Jeron Grahn E. Candis Schatz, MD 01/27/2022 11:07:16 AM This report has been signed electronically.

## 2022-01-27 NOTE — Progress Notes (Signed)
Called to room to assist during endoscopic procedure.  Patient ID and intended procedure confirmed with present staff. Received instructions for my participation in the procedure from the performing physician.  

## 2022-01-28 ENCOUNTER — Telehealth: Payer: Self-pay | Admitting: *Deleted

## 2022-01-28 NOTE — Telephone Encounter (Signed)
  Follow up Call-     01/27/2022    9:59 AM 11/11/2020    9:11 AM  Call back number  Post procedure Call Back phone  # [phone]  Permission to leave phone message Yes      Patient questions:  Do you have a fever, pain , or abdominal swelling? No. Pain Score  0 *  Have you tolerated food without any problems? Yes.    Have you been able to return to your normal activities? Yes.    Do you have any questions about your discharge instructions: Diet   No. Medications  No. Follow up visit  No.  Do you have questions or concerns about your Care? No.  Actions: * If pain score is 4 or above: No action needed, pain <4.

## 2022-01-31 NOTE — Progress Notes (Signed)
Kevin Jackson,   Three of the four polyps that I removed during your recent procedure were completely benign but were proven to be "pre-cancerous" polyps that MAY have grown into cancers if they had not been removed.  Studies shows that at least 20% of women over age 51 and 30% of men over age 103 have pre-cancerous polyps.  Based on current nationally recognized surveillance guidelines, I recommend that you have a repeat colonoscopy in 3 years.   The fourth polyp was a hyperplastic polyp, which is not precancerous.  If you develop any new rectal bleeding, abdominal pain or significant bowel habit changes, please contact me before then.

## 2022-02-19 IMAGING — CT CT L SPINE W/ CM
1 of 7 series · 5 of 14 positions shown, 7 images · non-contrast
Comparison: 02/27/2020 lumbar spine MRI from [REDACTED]

CLINICAL DATA: Low back and anterior left thigh pain. Numbness in
the left groin and medial thigh. Bilateral leg weakness. Left-sided
symptoms began after L3-4 fusion surgery in [DATE].
TECHNIQUE: Contiguous axial images were obtained through the Lumbar spine after
the intrathecal infusion of contrast. Coronal and sagittal
reconstructions were obtained of the axial image sets.

[Series 3: l spine soft (person_name) · axial · 0.29mm/px · z∈[-348,-140]mm · 5 of 157 slices shown, 7 images]
[im 27/157  soft-tissue]
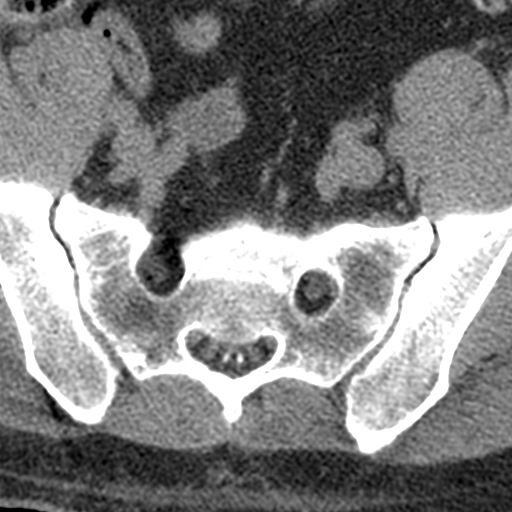
[im 27/157  bone]
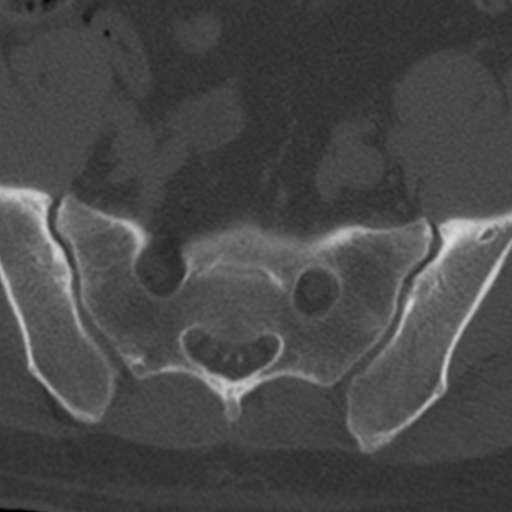
[im 53/157  bone]
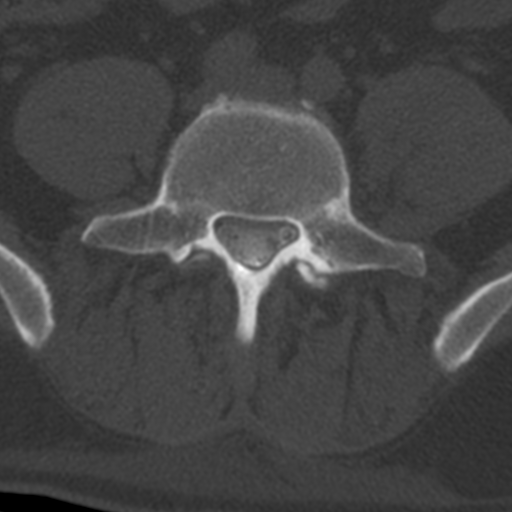
[im 79/157  bone]
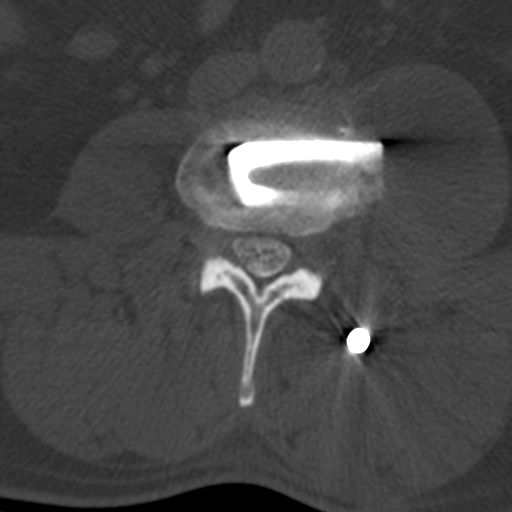
[im 105/157  bone]
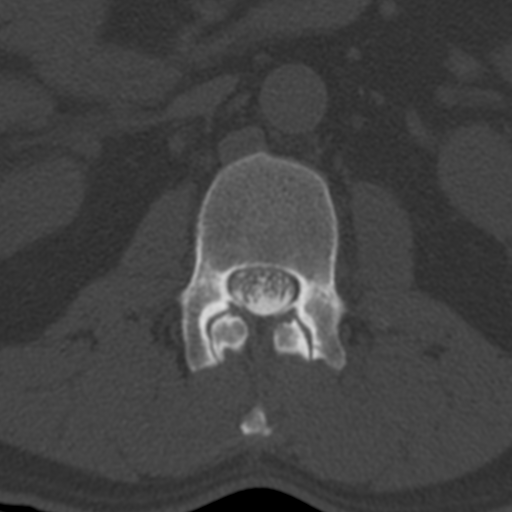
[im 131/157  soft-tissue]
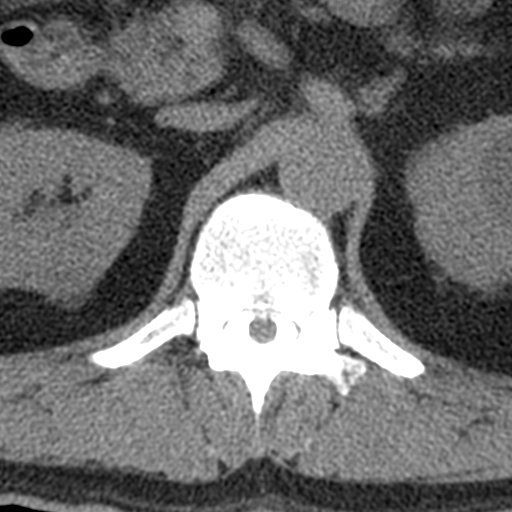
[im 131/157  bone]
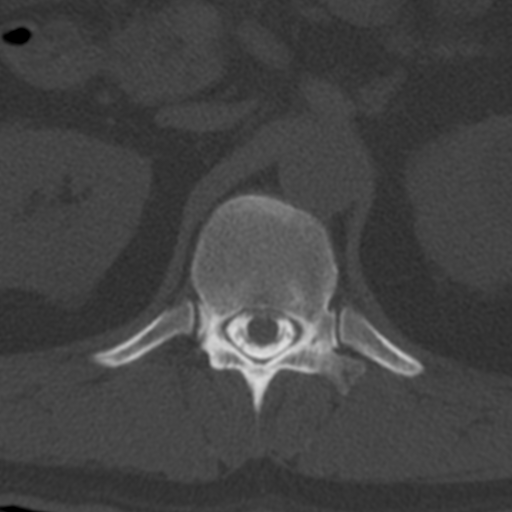

[5 of 14 positions shown; findings below may reference images not displayed]

EXAM:
LUMBAR MYELOGRAM

FLUOROSCOPY TIME:  Fluoroscopy Time: 39 seconds

Radiation Exposure Index: 284.79 microGray*m^2

PROCEDURE:
After thorough discussion of risks and benefits of the procedure
including bleeding, infection, injury to nerves, blood vessels,
adjacent structures as well as headache and CSF leak, written and
oral informed consent was obtained. Consent was obtained by Dr.
Sebuliba Myrdal. Time out form was completed.

Patient was positioned prone on the fluoroscopy table. Local
anesthesia was provided with 1% lidocaine without epinephrine after
prepped and draped in the usual sterile fashion. Puncture was
performed at L5-S1 using a 3 inch 22-gauge spinal needle via a right
interlaminar approach. Using a single pass through the dura, the
needle was placed within the thecal sac, with return of clear CSF.
15 mL of Isovue C-J33 was injected into the thecal sac, with normal
opacification of the nerve roots and cauda equina consistent with
free flow within the subarachnoid space.

I personally performed the lumbar puncture and administered the
intrathecal contrast. I also personally supervised acquisition of
the myelogram images.
FINDINGS: LUMBAR MYELOGRAM FINDINGS:

There are 5 non rib-bearing lumbar type vertebrae. Vertebral
alignment is normal, and no abnormal motion is evident on flexion or
extension radiographs. There has been interval posterior and
interbody fusion at L3-4. No sizable ventral extradural defect or
spinal stenosis is evident. No nerve root cut off is seen. Asymmetry
of the L5 and S1 nerve root sleeves corresponds to partially
conjoined right-sided nerve roots.

CT LUMBAR MYELOGRAM FINDINGS:

Vertebral alignment is normal. No fracture or suspicious osseous
lesion is identified. There has been interval interbody fusion at
L3-4 with evidence of some early incorporation of the interbody
graft. Posterior fusion has also been performed at L3-4 with
unilateral left-sided pedicle screws. The screws appear
well-positioned without evidence of loosening. There is an old
pedicle screw or other instrument track traversing the left L3-4
facet joint and left paramedian L3 vertebral body with the
trajectory of this track extending across the left lateral recess at
the L3 nerve root level.

The conus medullaris terminates at L1-2. There is a low-density
fluid collection in the left psoas muscle at L3-4 level which
measures 4.3 x 3.8 x 5.5 cm (AP x transverse x craniocaudal) and
abuts the interbody implant. There is a small amount of peripheral
calcification or early heterotopic ossification along the periphery
of the collection.

T11-12 and T12-L1: Negative.

L1-2: Mild right facet arthrosis without disc herniation or
stenosis.

L2-3: Mild facet hypertrophy without disc herniation or stenosis.

L3-4: Interval fusion. Widely patent spinal canal. Mildly limited
assessment of the neural foramina due to streak artifact with
suspected mild residual narrowing bilaterally.

L4-5: Minimal disc bulging and mild facet hypertrophy without
significant stenosis.

L5-S1: Shallow central disc protrusion without stenosis, unchanged.
Partially conjoined right-sided L5 and S1 nerve roots.
IMPRESSION: 1. Interval L3-4 fusion as detailed above. Adjacent 5.5 cm fluid
collection in the left psoas muscle, possibly a postoperative seroma
or old hematoma.
2. Mild spondylosis elsewhere without evidence of neural
impingement.

## 2022-02-19 IMAGING — XA DG MYELOGRAPHY LUMBAR INJ LUMBOSACRAL
13 of 17 series · 13 of 17 positions shown · non-contrast
Comparison: 02/27/2020 lumbar spine MRI from [REDACTED]

CLINICAL DATA: Low back and anterior left thigh pain. Numbness in
the left groin and medial thigh. Bilateral leg weakness. Left-sided
symptoms began after L3-4 fusion surgery in [DATE].
TECHNIQUE: Contiguous axial images were obtained through the Lumbar spine after
the intrathecal infusion of contrast. Coronal and sagittal
reconstructions were obtained of the axial image sets.

[Series 1: vasc adipose · 1 of 1 slices shown (1 of 10)]
[im 1/1]
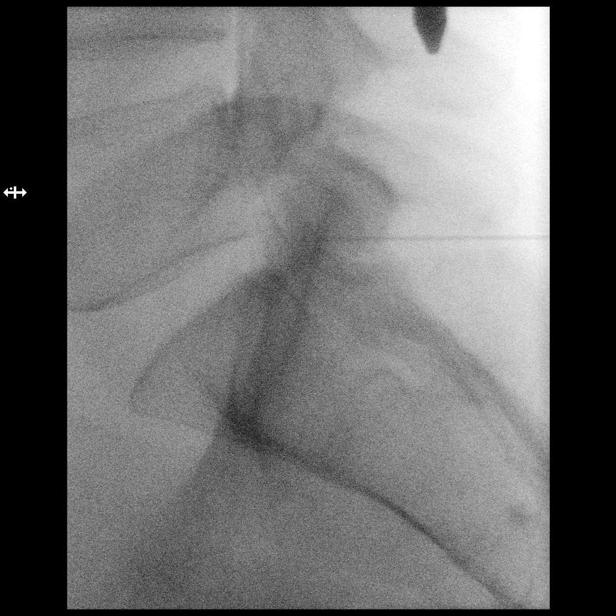

[Series 1: w lumbar spine lat · 0.15mm/px · 1 of 1 slices shown]
[im 1/1]
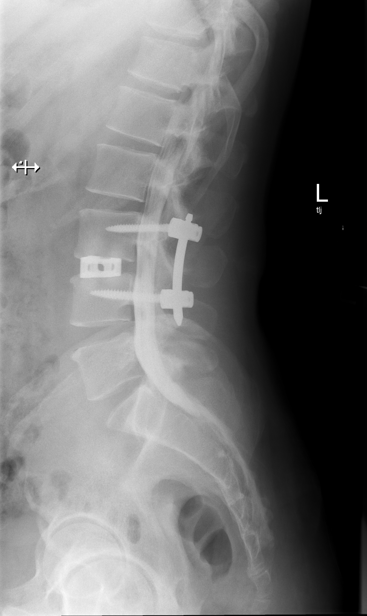

[Series 2: w lumbar spine flexion · 0.15mm/px · 1 of 1 slices shown]
[im 1/1]
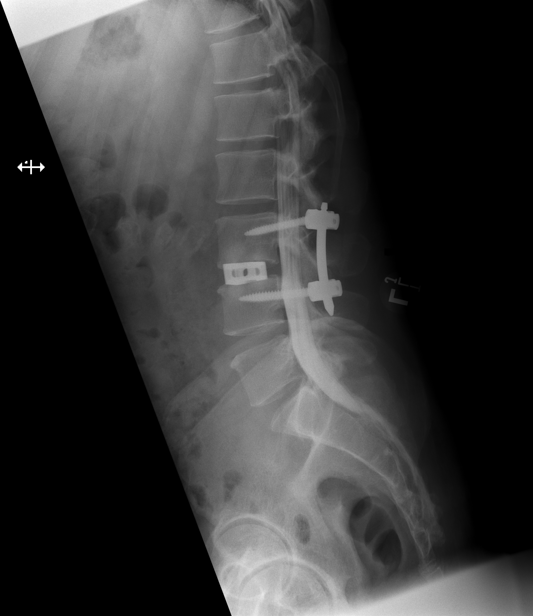

[Series 3: vasc adipose · 1 of 1 slices shown (2 of 10)]
[im 1/1]
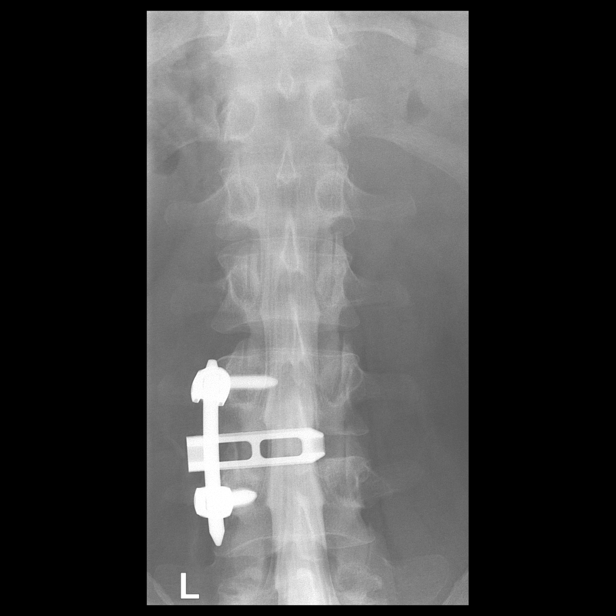

[Series 3: w lumbar spine extension · 0.15mm/px · 1 of 1 slices shown]
[im 1/1]
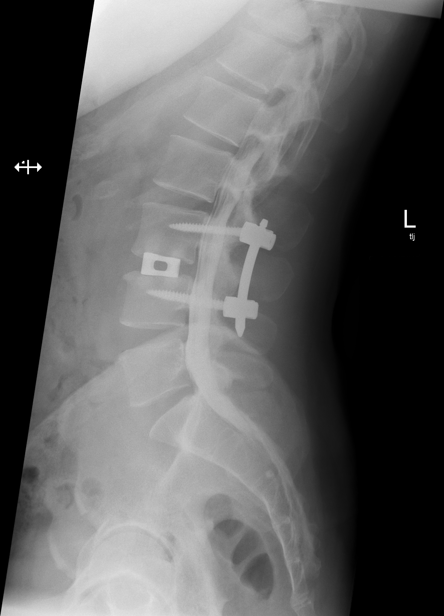

[Series 5: vasc adipose · 1 of 1 slices shown (3 of 10)]
[im 1/1]
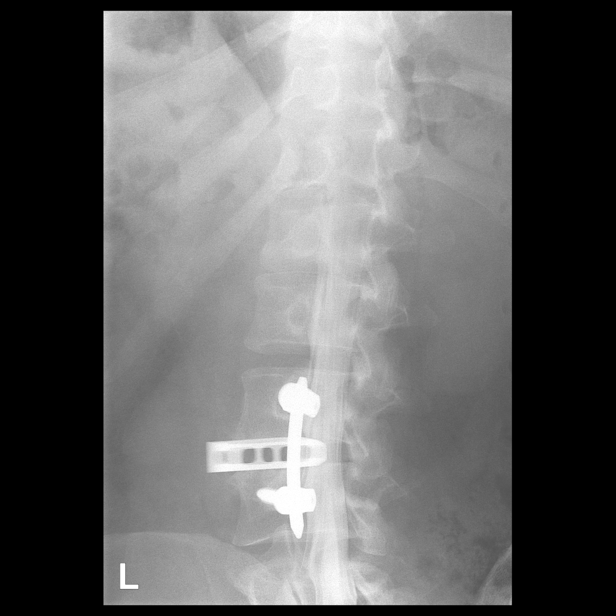

[Series 6: vasc adipose · 1 of 1 slices shown (4 of 10)]
[im 1/1]
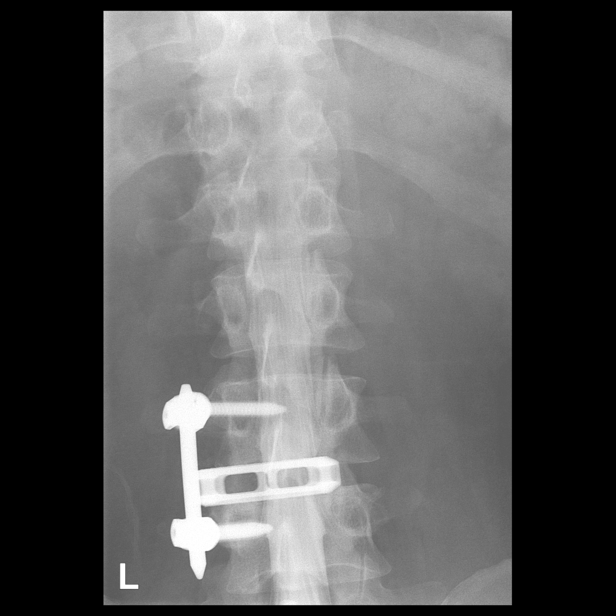

[Series 7: vasc adipose · 1 of 1 slices shown (5 of 10)]
[im 1/1]
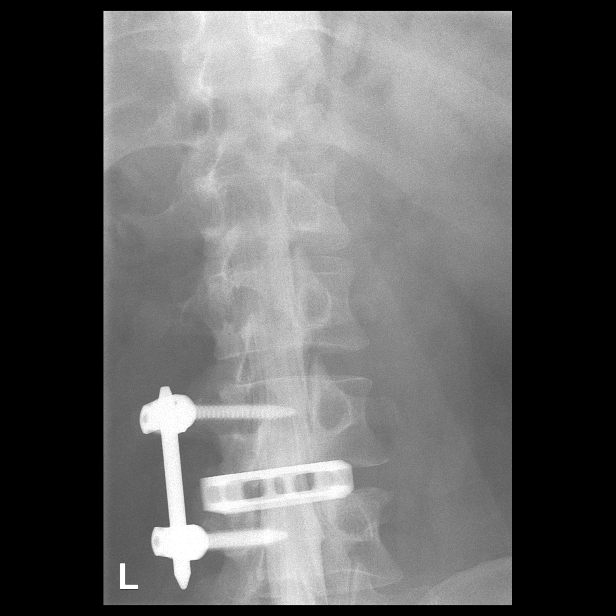

[Series 9: vasc adipose · 1 of 1 slices shown (6 of 10)]
[im 1/1]
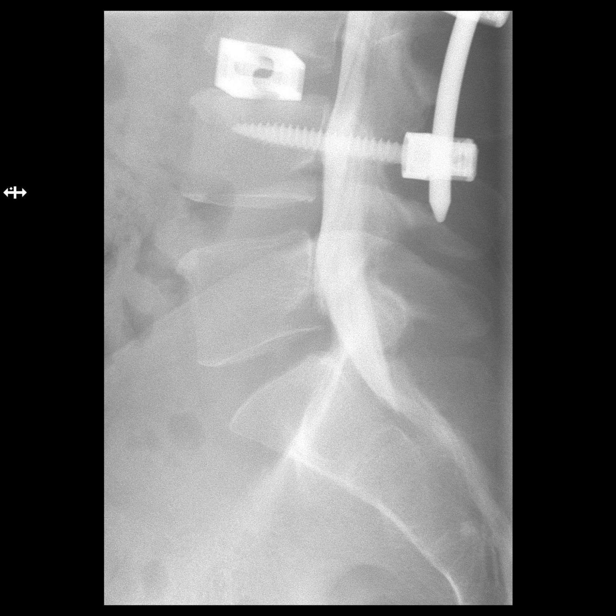

[Series 10: vasc adipose · 1 of 1 slices shown (7 of 10)]
[im 1/1]
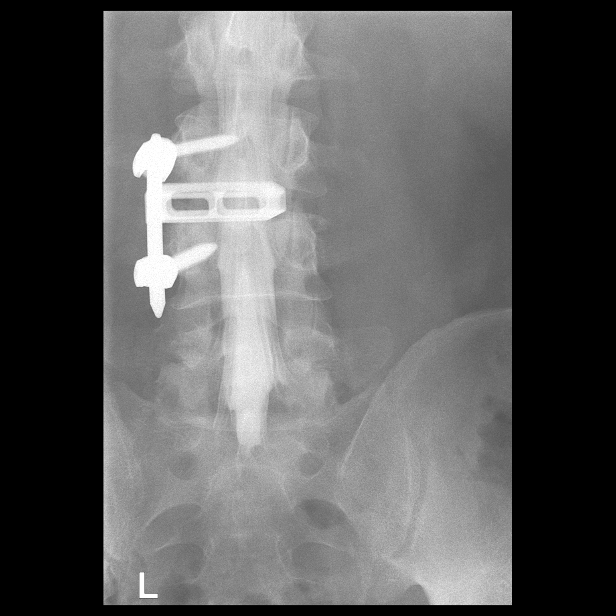

[Series 11: vasc adipose · 1 of 1 slices shown (8 of 10)]
[im 1/1]
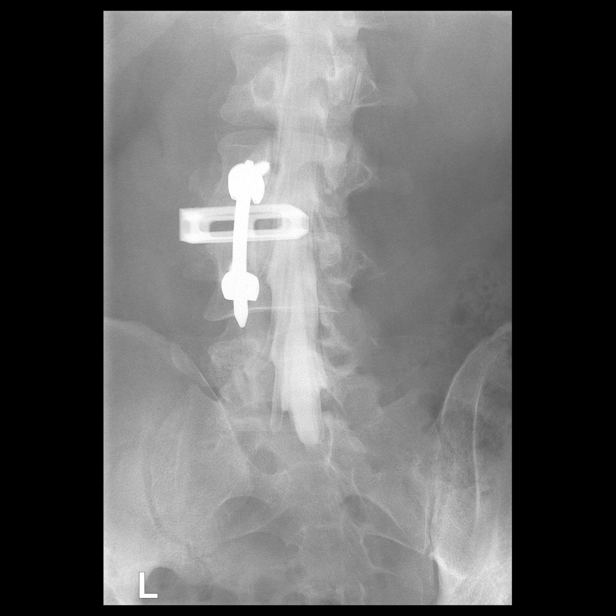

[Series 13: vasc adipose · 1 of 1 slices shown (9 of 10)]
[im 1/1]
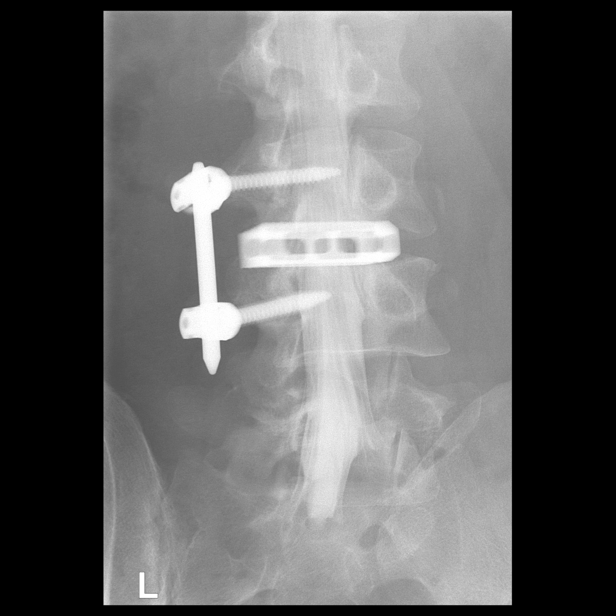

[Series 14: vasc adipose · 1 of 1 slices shown (10 of 10)]
[im 1/1]
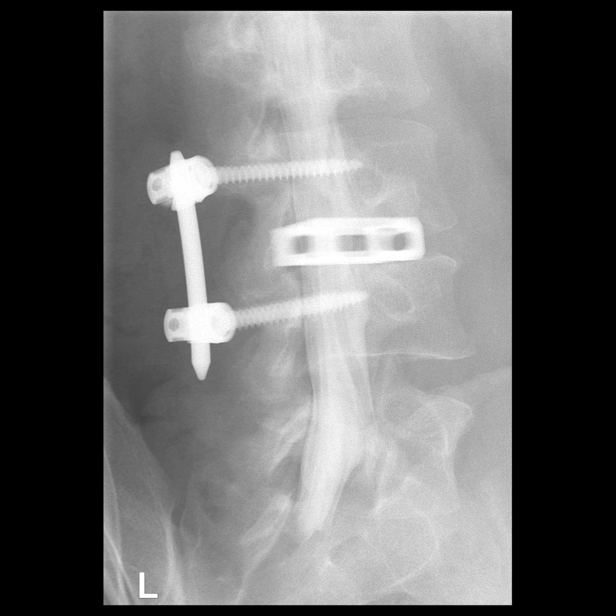

[13 of 17 positions shown; findings below may reference images not displayed]

EXAM:
LUMBAR MYELOGRAM

FLUOROSCOPY TIME:  Fluoroscopy Time: 39 seconds

Radiation Exposure Index: 284.79 microGray*m^2

PROCEDURE:
After thorough discussion of risks and benefits of the procedure
including bleeding, infection, injury to nerves, blood vessels,
adjacent structures as well as headache and CSF leak, written and
oral informed consent was obtained. Consent was obtained by Dr.
Sebuliba Myrdal. Time out form was completed.

Patient was positioned prone on the fluoroscopy table. Local
anesthesia was provided with 1% lidocaine without epinephrine after
prepped and draped in the usual sterile fashion. Puncture was
performed at L5-S1 using a 3 inch 22-gauge spinal needle via a right
interlaminar approach. Using a single pass through the dura, the
needle was placed within the thecal sac, with return of clear CSF.
15 mL of Isovue C-J33 was injected into the thecal sac, with normal
opacification of the nerve roots and cauda equina consistent with
free flow within the subarachnoid space.

I personally performed the lumbar puncture and administered the
intrathecal contrast. I also personally supervised acquisition of
the myelogram images.
FINDINGS: LUMBAR MYELOGRAM FINDINGS:

There are 5 non rib-bearing lumbar type vertebrae. Vertebral
alignment is normal, and no abnormal motion is evident on flexion or
extension radiographs. There has been interval posterior and
interbody fusion at L3-4. No sizable ventral extradural defect or
spinal stenosis is evident. No nerve root cut off is seen. Asymmetry
of the L5 and S1 nerve root sleeves corresponds to partially
conjoined right-sided nerve roots.

CT LUMBAR MYELOGRAM FINDINGS:

Vertebral alignment is normal. No fracture or suspicious osseous
lesion is identified. There has been interval interbody fusion at
L3-4 with evidence of some early incorporation of the interbody
graft. Posterior fusion has also been performed at L3-4 with
unilateral left-sided pedicle screws. The screws appear
well-positioned without evidence of loosening. There is an old
pedicle screw or other instrument track traversing the left L3-4
facet joint and left paramedian L3 vertebral body with the
trajectory of this track extending across the left lateral recess at
the L3 nerve root level.

The conus medullaris terminates at L1-2. There is a low-density
fluid collection in the left psoas muscle at L3-4 level which
measures 4.3 x 3.8 x 5.5 cm (AP x transverse x craniocaudal) and
abuts the interbody implant. There is a small amount of peripheral
calcification or early heterotopic ossification along the periphery
of the collection.

T11-12 and T12-L1: Negative.

L1-2: Mild right facet arthrosis without disc herniation or
stenosis.

L2-3: Mild facet hypertrophy without disc herniation or stenosis.

L3-4: Interval fusion. Widely patent spinal canal. Mildly limited
assessment of the neural foramina due to streak artifact with
suspected mild residual narrowing bilaterally.

L4-5: Minimal disc bulging and mild facet hypertrophy without
significant stenosis.

L5-S1: Shallow central disc protrusion without stenosis, unchanged.
Partially conjoined right-sided L5 and S1 nerve roots.
IMPRESSION: 1. Interval L3-4 fusion as detailed above. Adjacent 5.5 cm fluid
collection in the left psoas muscle, possibly a postoperative seroma
or old hematoma.
2. Mild spondylosis elsewhere without evidence of neural
impingement.

## 2022-03-28 ENCOUNTER — Other Ambulatory Visit: Payer: Self-pay | Admitting: Family

## 2022-03-28 NOTE — Telephone Encounter (Signed)
Patient is requesting a refill of the following medications: Requested Prescriptions   Pending Prescriptions Disp Refills   diltiazem (TIAZAC) 180 MG 24 hr capsule [Pharmacy Med Name: dilTIAZem HCl ER Beads 180 MG Oral Capsule Extended Release 24 Hour] 90 capsule 0    Sig: Take 1 capsule by mouth once daily    Okay to refill. Not seen since 10/2021

## 2022-05-20 ENCOUNTER — Encounter: Payer: Self-pay | Admitting: Family

## 2022-05-21 ENCOUNTER — Telehealth: Payer: 59 | Admitting: Physician Assistant

## 2022-05-21 DIAGNOSIS — J020 Streptococcal pharyngitis: Secondary | ICD-10-CM

## 2022-05-21 MED ORDER — AMOXICILLIN 500 MG PO CAPS: 500.0000 mg | ORAL_CAPSULE | Freq: Two times a day (BID) | ORAL | 0 refills | Status: AC

## 2022-05-21 NOTE — Progress Notes (Signed)

## 2022-05-27 ENCOUNTER — Other Ambulatory Visit: Payer: Self-pay | Admitting: Student

## 2022-06-27 ENCOUNTER — Other Ambulatory Visit: Payer: Self-pay | Admitting: Family

## 2022-08-28 ENCOUNTER — Other Ambulatory Visit: Payer: Self-pay | Admitting: Family

## 2022-09-20 ENCOUNTER — Other Ambulatory Visit: Payer: Self-pay | Admitting: Family

## 2022-10-31 ENCOUNTER — Other Ambulatory Visit: Payer: Self-pay | Admitting: Student

## 2022-11-20 ENCOUNTER — Other Ambulatory Visit: Payer: Self-pay | Admitting: Student

## 2022-11-24 ENCOUNTER — Other Ambulatory Visit: Payer: Self-pay | Admitting: Family

## 2022-11-25 ENCOUNTER — Encounter: Payer: Self-pay | Admitting: *Deleted

## 2022-12-09 ENCOUNTER — Encounter: Payer: Self-pay | Admitting: Family

## 2022-12-09 ENCOUNTER — Ambulatory Visit (INDEPENDENT_AMBULATORY_CARE_PROVIDER_SITE_OTHER): Payer: Medicare Other | Admitting: Family

## 2022-12-09 VITALS — BP 118/84 | HR 90 | Ht 70.0 in | Wt 210.4 lb

## 2022-12-09 DIAGNOSIS — Z125 Encounter for screening for malignant neoplasm of prostate: Secondary | ICD-10-CM

## 2022-12-09 DIAGNOSIS — Z0000 Encounter for general adult medical examination without abnormal findings: Secondary | ICD-10-CM

## 2022-12-09 DIAGNOSIS — Z13220 Encounter for screening for lipoid disorders: Secondary | ICD-10-CM

## 2022-12-09 DIAGNOSIS — L989 Disorder of the skin and subcutaneous tissue, unspecified: Secondary | ICD-10-CM

## 2022-12-09 DIAGNOSIS — R002 Palpitations: Secondary | ICD-10-CM

## 2022-12-09 LAB — CBC WITH DIFFERENTIAL/PLATELET
Absolute Monocytes: 713 cells/uL (ref 200–950)
Basophils Relative: 0.7 %
Eosinophils Relative: 1.1 %
MCH: 30.4 pg (ref 27.0–33.0)
MCHC: 34.4 g/dL (ref 32.0–36.0)
MCV: 88.5 fL (ref 80.0–100.0)
Monocytes Relative: 9.9 %
Neutro Abs: 4860 cells/uL (ref 1500–7800)
Platelets: 291 10*3/uL (ref 140–400)
RBC: 5.49 10*6/uL (ref 4.20–5.80)
Total Lymphocyte: 20.8 %

## 2022-12-09 MED ORDER — DILTIAZEM HCL ER BEADS 180 MG PO CP24: ORAL_CAPSULE | ORAL | 3 refills | Status: DC

## 2022-12-09 MED ORDER — ATORVASTATIN CALCIUM 40 MG PO TABS: 40.0000 mg | ORAL_TABLET | Freq: Every day | ORAL | 3 refills | Status: DC

## 2022-12-09 MED ORDER — PANTOPRAZOLE SODIUM 40 MG PO TBEC: 40.0000 mg | DELAYED_RELEASE_TABLET | Freq: Every day | ORAL | 3 refills | Status: DC

## 2022-12-09 MED ORDER — TADALAFIL 20 MG PO TABS: 20.0000 mg | ORAL_TABLET | Freq: Every day | ORAL | 1 refills | Status: AC | PRN

## 2022-12-09 NOTE — Progress Notes (Signed)
Kevin Jackson is a 52 y.o. 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     pregabalin (LYRICA) 150 MG capsule Take 150 mg by mouth 2 (two) times daily.     atorvastatin (LIPITOR) 40 MG tablet Take 1 tablet (40 mg total) by mouth daily. 90 tablet 3   diltiazem (TIAZAC) 180 MG 24 hr capsule Take 1 capsule by mouth once daily 90 capsule 3   fluticasone (FLONASE) 50 MCG/ACT nasal spray Place 1 spray into both nostrils daily. (Patient not taking: Reported on 12/09/2022) 16 g 6   pantoprazole (PROTONIX) 40 MG tablet Take 1 tablet (40 mg total) by mouth daily. 90 tablet 3   tadalafil (CIALIS) 20 MG tablet Take 1 tablet (20 mg total) by mouth daily as needed for erectile dysfunction. 10 tablet 1   No current facility-administered medications for this visit.    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, back , legs- + neuropathy   Numerous moles 01/19/2012   Shortened PR interval 08/01/2015   Supraventricular tachycardia 09/17/2019    Past Surgical History:  Procedure Laterality Date   APPENDECTOMY     BACK SURGERY     L3L4 fusion   CYSTOSCOPY/URETEROSCOPY/HOLMIUM LASER/STENT PLACEMENT Right 07/16/2020   Procedure: CYSTOSCOPY/URETEROSCOPY/HOLMIUM LASER/STENT PLACEMENT;  Surgeon: Belva Agee, MD;  Location: [facility];  Service: Urology;  Laterality: Right;   HERNIA REPAIR     INCISIONAL HERNIA REPAIR N/A 05/12/2021   Procedure: INCISIONAL HERNIA REPAIR WITH MESH;  Surgeon: Harriette Bouillon, MD;  Location: [facility];  Service: General;  Laterality: N/A;   LAPAROSCOPIC GASTROTOMY W/ REPAIR OF ULCER     MASS EXCISION N/A 01/22/2020   Procedure: EXCISION OF XIPHOID;  Surgeon: Kerin Perna, MD;  Location: [facility] OR;  Service: Thoracic;  Laterality: N/A;   pain stimulator  2023   right thoracotomy for exposure for the C7-T8 diskectomy  02/06/2007   Dr Edwyna Shell   TONSILLECTOMY      Family History  Problem Relation Age of  Onset   Hypertension Mother    Asthma Mother    Healthy Sister    Healthy Brother    Other Brother        tumors   Colon polyps Maternal Uncle    Cancer Paternal Uncle        intestinal   Scoliosis Daughter    Healthy Son    Colon cancer Neg Hx    Esophageal cancer Neg Hx    Rectal cancer Neg Hx    Stomach cancer Neg Hx     Social History   Tobacco Use   Smoking status: Former    Current packs/day: 0.00    Average packs/day: 1 pack/day for 2.0 years (2.0 ttl pk-yrs)    Types: Cigarettes    Start date: 05/24/1995    Quit date: 05/23/1997    Years since quitting: 25.5    Smokeless tobacco: Former  Substance Use Topics   Alcohol use: Yes    Alcohol/week: 6.0 standard drinks of alcohol    Types: 6 Cans of beer per week    Comment: social    Subjective:   Patient presents for CPE; does need to get refills updated;   Review of Systems  Constitutional: Negative.   HENT: Negative.    Eyes: Negative.   Respiratory: Negative.    Cardiovascular: Negative.   Gastrointestinal: Negative.   Genitourinary: Negative.   Musculoskeletal:  Positive for back pain.  Skin: Negative.   Neurological: Negative.   Psychiatric/Behavioral: Negative.        Objective:  Vitals:   12/09/22 1316  BP: 118/84  Pulse: 90  SpO2: 96%  Weight: 210 lb 6.4 oz (95.4 kg)  Height: 5\' 10"  (1.778 m)    General: Well developed, well nourished, in no acute distress  Skin : Warm and dry.  Head: Normocephalic and atraumatic  Eyes: Sclera and conjunctiva clear; pupils round and reactive to light; extraocular movements intact  Ears: External normal; canals clear; tympanic membranes normal  Oropharynx: Pink, supple. No suspicious lesions  Neck: Supple without thyromegaly, adenopathy  Lungs: Respirations unlabored; clear to auscultation bilaterally without wheeze, rales, rhonchi  CVS exam: normal rate and regular rhythm.  Abdomen: Soft; nontender; nondistended; normoactive bowel sounds; no masses or hepatosplenomegaly  Musculoskeletal: No deformities; no active joint inflammation  Extremities: No edema, cyanosis, clubbing  Vessels: Symmetric bilaterally  Neurologic: Alert and oriented; speech intact; face symmetrical; moves all extremities well; CNII-XII intact without focal deficit  Assessment:  1. PE (physical exam), annual   2. Skin lesion   3. Palpitations   4. Lipid screening   5. Prostate cancer screening     Plan:  Age appropriate preventive healthcare needs addressed; encouraged regular eye doctor and dental exams; encouraged regular exercise; will update labs and  refills as needed today; follow-up to be determined; Referral updated to cardiology and dermatology; will also reach out to pulmonology to see if able to get name of neuromuscular provider that patient needs to see in follow up;  No follow-ups on file.  Orders Placed This Encounter  Procedures   CBC with Differential/Platelet   Comp Met (CMET)   Lipid panel   PSA   Ambulatory referral to Dermatology    Referral Priority:   Routine    Referral Type:   Consultation    Referral Reason:   Specialty Services Required    Requested Specialty:   Dermatology    Number of Visits Requested:   1   Ambulatory referral to Cardiology  Referral Priority:   Routine    Referral Type:   Consultation    Referral Reason:   Specialty Services Required    Referred to Provider:   Little Ishikawa, MD    Number of Visits Requested:   1    Requested Prescriptions   Signed Prescriptions Disp Refills   atorvastatin (LIPITOR) 40 MG tablet 90 tablet 3    Sig: Take 1 tablet (40 mg total) by mouth daily.   diltiazem (TIAZAC) 180 MG 24 hr capsule 90 capsule 3    Sig: Take 1 capsule by mouth once daily   pantoprazole (PROTONIX) 40 MG tablet 90 tablet 3    Sig: Take 1 tablet (40 mg total) by mouth daily.   tadalafil (CIALIS) 20 MG tablet 10 tablet 1    Sig: Take 1 tablet (20 mg total) by mouth daily as needed for erectile dysfunction.

## 2022-12-10 LAB — CBC WITH DIFFERENTIAL/PLATELET
Basophils Absolute: 50 cells/uL (ref 0–200)
Eosinophils Absolute: 79 cells/uL (ref 15–500)
HCT: 48.6 % (ref 38.5–50.0)
Hemoglobin: 16.7 g/dL (ref 13.2–17.1)
Lymphs Abs: 1498 cells/uL (ref 850–3900)
MPV: 10.4 fL (ref 7.5–12.5)
Neutrophils Relative %: 67.5 %
RDW: 12.7 % (ref 11.0–15.0)
WBC: 7.2 10*3/uL (ref 3.8–10.8)

## 2022-12-10 LAB — COMPREHENSIVE METABOLIC PANEL
AG Ratio: 2 (calc) (ref 1.0–2.5)
ALT: 30 U/L (ref 9–46)
AST: 21 U/L (ref 10–35)
Albumin: 5.1 g/dL (ref 3.6–5.1)
Alkaline phosphatase (APISO): 107 U/L (ref 35–144)
BUN: 14 mg/dL (ref 7–25)
CO2: 27 mmol/L (ref 20–32)
Calcium: 10 mg/dL (ref 8.6–10.3)
Chloride: 103 mmol/L (ref 98–110)
Creat: 0.98 mg/dL (ref 0.70–1.30)
Globulin: 2.5 g/dL (calc) (ref 1.9–3.7)
Glucose, Bld: 99 mg/dL (ref 65–99)
Potassium: 4.2 mmol/L (ref 3.5–5.3)
Sodium: 141 mmol/L (ref 135–146)
Total Bilirubin: 1.2 mg/dL (ref 0.2–1.2)
Total Protein: 7.6 g/dL (ref 6.1–8.1)

## 2022-12-10 LAB — PSA: PSA: 0.54 ng/mL (ref <=4.00)

## 2022-12-10 LAB — LIPID PANEL
Cholesterol: 145 mg/dL (ref <200)
HDL: 46 mg/dL (ref >=40)
LDL Cholesterol (Calc): 79 mg/dL (calc)
Non-HDL Cholesterol (Calc): 99 mg/dL (calc) (ref <130)
Total CHOL/HDL Ratio: 3.2 (calc) (ref <5.0)
Triglycerides: 113 mg/dL (ref <150)

## 2022-12-22 DIAGNOSIS — M961 Postlaminectomy syndrome, not elsewhere classified: Secondary | ICD-10-CM | POA: Diagnosis not present

## 2022-12-22 DIAGNOSIS — Z5181 Encounter for therapeutic drug level monitoring: Secondary | ICD-10-CM | POA: Diagnosis not present

## 2022-12-22 DIAGNOSIS — M5417 Radiculopathy, lumbosacral region: Secondary | ICD-10-CM | POA: Diagnosis not present

## 2022-12-22 DIAGNOSIS — Z79899 Other long term (current) drug therapy: Secondary | ICD-10-CM | POA: Diagnosis not present

## 2022-12-22 DIAGNOSIS — G894 Chronic pain syndrome: Secondary | ICD-10-CM | POA: Diagnosis not present

## 2023-02-13 ENCOUNTER — Encounter: Payer: Self-pay | Admitting: Family

## 2023-02-16 DIAGNOSIS — M47816 Spondylosis without myelopathy or radiculopathy, lumbar region: Secondary | ICD-10-CM | POA: Diagnosis not present

## 2023-02-16 DIAGNOSIS — M5417 Radiculopathy, lumbosacral region: Secondary | ICD-10-CM | POA: Diagnosis not present

## 2023-02-16 DIAGNOSIS — G894 Chronic pain syndrome: Secondary | ICD-10-CM | POA: Diagnosis not present

## 2023-02-16 DIAGNOSIS — M961 Postlaminectomy syndrome, not elsewhere classified: Secondary | ICD-10-CM | POA: Diagnosis not present

## 2023-02-21 NOTE — Progress Notes (Deleted)
Cardiology Office Note:    Date:  02/21/2023   ID:  Kyra Manges, DOB Aug 09, 1970, MRN [id number]  PCP:  Olive Bass, FNP  Cardiologist:  Thomasene Ripple, DO  Electrophysiologist:  None   Referring MD: Olive Bass,*   No chief complaint on file.   History of Present Illness:    Kevin Jackson is a 52 y.o. male with a hx of SVT, hypertension, hyperlipidemia who presents for follow-up.  He was initially seen by Dr. Servando Salina for evaluation of palpitations in April 2021.  Zio patch x14 days was done, which showed 3 runs of SVT, longest lasting 14 seconds, as well as symptomatic PVCs.  He was started on Cardizem 120 mg daily with improvement in his palpitations.  Reports that he has continued to have palpitations and last week his diltiazem dose was increased to 180 mg, with improvement in his symptoms.  He denies any lightheadedness or syncope.  Reports palpitations typically last for few seconds and then resolve.  He does not exercise.  States that most activity he does is yard work and playing with his kids.  Denies any exertional symptoms.  Reports rare episodes of left-sided chest pain that last for few seconds and resolved.  Occasional ankle edema.  Drinks coffee 2-3 times per week, denies any correlation with palpitations.  Occasional energy drink.  Drinks alcohol about once per month.  She is tobacco but no cigarette use.  No history of heart disease in his immediate family.  Echocardiogram 08/27/2015 showed LVEF 60 to 65%, grade 1 diastolic dysfunction, mild RV dilatation, mild MR.  ETT 08/18/2015 with negative for ischemia.  Echocardiogram 05/13/2020 showed EF 65 to 70%, normal diastolic function, normal RV function, no significant valvular disease.  Since last clinic visit,  Past Medical History:  Diagnosis Date   Anxiety    Arthritis    hips and lower back   BACK PAIN, LUMBAR 08/06/2009   Qualifier: Diagnosis of  By: Thurmon Fair CMA (AAMA), Rachel      Blood transfusion without reported diagnosis    CHEST PAIN 05/06/2010   Qualifier: Diagnosis of  By: Tawanna Cooler MD, Tinnie Gens A    Chronic chest wall pain 08/28/2015   Depression    Dysrhythmia    GERD (gastroesophageal reflux disease)    History of kidney stones    Hypertension    Incisional hernia    Mixed hyperlipidemia 09/17/2019   Neuralgia    Neuromuscular disorder (HCC)    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, back , legs- + neuropathy   Numerous moles 01/19/2012   Shortened PR interval 08/01/2015   Supraventricular tachycardia 09/17/2019    Past Surgical History:  Procedure Laterality Date   APPENDECTOMY     BACK SURGERY     L3L4 fusion   CYSTOSCOPY/URETEROSCOPY/HOLMIUM LASER/STENT PLACEMENT Right 07/16/2020   Procedure: CYSTOSCOPY/URETEROSCOPY/HOLMIUM LASER/STENT PLACEMENT;  Surgeon: Belva Agee, MD;  Location: [facility];  Service: Urology;  Laterality: Right;   HERNIA REPAIR     INCISIONAL HERNIA REPAIR N/A 05/12/2021   Procedure: INCISIONAL HERNIA REPAIR WITH MESH;  Surgeon: Harriette Bouillon, MD;  Location: [facility];  Service: General;  Laterality: N/A;   LAPAROSCOPIC GASTROTOMY W/ REPAIR OF ULCER     MASS EXCISION N/A 01/22/2020   Procedure: EXCISION OF XIPHOID;  Surgeon: Kerin Perna, MD;  Location: [facility] OR;  Service: Thoracic;  Laterality: N/A;   pain stimulator  2023   right thoracotomy for exposure for the C7-T8 diskectomy  02/06/2007  Dr Edwyna Shell   TONSILLECTOMY      Current Medications: No outpatient medications have been marked as taking for the 02/22/23 encounter (Appointment) with Little Ishikawa, MD.     Allergies:   Bee venom   Social History   Socioeconomic History   Marital status: Married    Spouse name: Not on file   Number of children: Not on file   Years of education: Not on file   Highest education level: Not on file  Occupational History    Employer:  [employer]  Tobacco Use   Smoking status: Former    Current  packs/day: 0.00    Average packs/day: 1 pack/day for 2.0 years (2.0 ttl pk-yrs)    Types: Cigarettes    Start date: 05/24/1995    Quit date: 05/23/1997    Years since quitting: 25.7   Smokeless tobacco: Former  Building services engineer status: Never Used  Substance and Sexual Activity   Alcohol use: Yes    Alcohol/week: 6.0 standard drinks of alcohol    Types: 6 Cans of beer per week    Comment: social   Drug use: Not Currently   Sexual activity: Yes  Other Topics Concern   Not on file  Social History Narrative   Not on file   Social Determinants of Health   Financial Resource Strain: Not on file  Food Insecurity: Not on file  Transportation Needs: Not on file  Physical Activity: Not on file  Stress: Not on file  Social Connections: Unknown (09/19/2021)   [facility], [facility]   Social Network    Social Network: Not on file     Family History: The patient's  family history includes Asthma in his mother; Cancer in his paternal uncle; Colon polyps in his maternal uncle; Healthy in his brother, sister, and son; Hypertension in his mother; Other in his brother; Scoliosis in his daughter. There is no history of Colon cancer, Esophageal cancer, Rectal cancer, or Stomach cancer.  ROS:   Please see the history of present illness.     All other systems reviewed and are negative.  EKGs/Labs/Other Studies Reviewed:    The following studies were reviewed today:   EKG:  EKG is  ordered today.  The ekg ordered today demonstrates normal sinus rhythm, rate 74, no PACs/PVCs  Recent Labs: 12/09/2022: ALT 30; BUN 14; Creat 0.98; Hemoglobin 16.7; Platelets 291; Potassium 4.2; Sodium 141  Recent Lipid Panel    Component Value Date/Time   CHOL 145 12/09/2022 1406   CHOL 139 09/17/2019 0927   TRIG 113 12/09/2022 1406   HDL 46 12/09/2022 1406   HDL 48 09/17/2019 0927   CHOLHDL 3.2 12/09/2022 1406   VLDL 24.8 07/27/2021 1127   LDLCALC 79 12/09/2022 1406    Physical  Exam:    VS:  There were no vitals taken for this visit.    Wt Readings from Last 3 Encounters:  12/09/22 210 lb 6.4 oz (95.4 kg)  01/27/22 211 lb (95.7 kg)  01/06/22 211 lb (95.7 kg)     GEN:  Well nourished, well developed in no acute distress HEENT: Normal NECK: No JVD; No carotid bruits CARDIAC: RRR, no murmurs, rubs, gallops RESPIRATORY:  Clear to auscultation without rales, wheezing or rhonchi  ABDOMEN: Soft, non-tender, non-distended MUSCULOSKELETAL:  No edema; No deformity  SKIN: Warm and dry NEUROLOGIC:  Alert and oriented x 3 PSYCHIATRIC:  Normal affect   ASSESSMENT:    No diagnosis found.  PLAN:    Palpitations: Zio patch 09/2019 showed short episodes of SVT, up to 14 seconds.   Echocardiogram 05/13/2020 showed EF 65 to 70%, normal diastolic function, normal RV function, no significant valvular disease.  Appears to be symptomatic from PVCs.  Reports symptoms have improved with diltiazem.   -Continue diltiazem 180 mg daily  Hyperlipidemia: On atorvastatin 40 mg daily.  LDL 73 on 09/17/2019.  Daytime somnolence/observed apnea events: Sleep study 05/2020 showed no significant OSA  Tobacco use: Uses chewing tobacco.  Cessation strongly recommended.  RTC in 3 months***  Medication Adjustments/Labs and Tests Ordered: Current medicines are reviewed at length with the patient today.  Concerns regarding medicines are outlined above.  No orders of the defined types were placed in this encounter.  No orders of the defined types were placed in this encounter.   There are no Patient Instructions on file for this visit.   Signed, Little Ishikawa, MD  02/21/2023 6:18 AM    [facility]

## 2023-02-22 ENCOUNTER — Ambulatory Visit: Payer: Medicare Other | Admitting: Cardiology

## 2023-03-20 NOTE — Progress Notes (Deleted)
Cardiology Office Note   Date:  03/20/2023  ID:  Kevin Jackson, DOB March 05, 1971, MRN [id number] PCP:  Olive Bass, FNP [facility] Cardiologist: Thomasene Ripple, DO  Reason for visit: Palpitations  History of Present Illness    Kevin Jackson is a 52 y.o. male with a hx of SVT, hypertension, hyperlipidemia, palpitations.    He was seen by Dr. Servando Salina in 2021 for palpitations.  Zio patch x14 days was done, which showed 3 runs of SVT, longest lasting 14 seconds, as well as symptomatic PVCs.  He was started on Cardizem 120 mg daily with improvement in his palpitations.  He had follow-up with Dr. Bjorn Pippin in November 2021 for palpitations.  He suspected untreated sleep apnea may be contributing to arrhythmias.  Continued on diltiazem 180 mg daily and sleep study & echo ordered. Tobacco cessation recommended.  Today, ***  Palpitations -Zio patch x 2 weeks in 2021 showed 3 runs of SVT longest lasting 14 seconds, symptomatic PVCs -Echo 2021 with EF 65 to 70%, mild LVH, no significant valve disease -***  Suspected sleep apnea Daytime somnolence  -***  Hypertension -*** -Goal BP is <130/80.  Recommend DASH diet (high in vegetables, fruits, low-fat dairy products, whole grains, poultry, fish, and nuts and low in sweets, sugar-sweetened beverages, and red meats), salt restriction and increase physical activity.  Hyperlipidemia -*** -Recommend cholesterol lowering diets - Mediterranean diet, DASH diet, vegetarian diet, low-carbohydrate diet and avoidance of trans fats.  Discussed healthier choice substitutes.  Nuts, high-fiber foods, and fiber supplements may also improve lipids.    Obesity -Even a 5-10% weight loss can have cardiovascular benefits.   -Recommend moderate intensity activity for 30 minutes 5 days/week and the DASH diet.  Tobacco use  -Recommend tobacco cessation.  Reviewed physiologic effects of nicotine and the immediate-eventual  benefits of quitting including improvement in cough/breathing and reduction in cardiovascular events.  Discussed quitting tips such as removing triggers and getting support from family/friends and Quitline [place]. -USPSTF recommends one-time screening for abdominal aortic aneurysm (AAA) by ultrasound in men 68 -64 years old who have ever smoked.      Disposition - Follow-up in ***     Objective / Physical Exam   EKG today: ***  Vital signs:  There were no vitals taken for this visit.    GEN: No acute distress NECK: No carotid bruits CARDIAC: ***RRR, no murmurs RESPIRATORY:  Clear to auscultation without rales, wheezing or rhonchi  EXTREMITIES: No edema  Assessment and Plan   ***   {Are you ordering a CV Procedure (e.g. stress test, cath, DCCV, TEE, etc)?   Press F2        :[id number]}    Signed, Bernette Mayers  03/20/2023 [facility]

## 2023-03-22 ENCOUNTER — Ambulatory Visit: Payer: Medicare Other | Admitting: Physician Assistant

## 2023-03-22 DIAGNOSIS — I471 Supraventricular tachycardia, unspecified: Secondary | ICD-10-CM

## 2023-03-22 DIAGNOSIS — Z720 Tobacco use: Secondary | ICD-10-CM

## 2023-03-22 DIAGNOSIS — R400 Somnolence: Secondary | ICD-10-CM

## 2023-03-22 DIAGNOSIS — E782 Mixed hyperlipidemia: Secondary | ICD-10-CM

## 2023-03-22 DIAGNOSIS — I493 Ventricular premature depolarization: Secondary | ICD-10-CM

## 2023-03-24 ENCOUNTER — Encounter: Payer: Self-pay | Admitting: Cardiology

## 2023-03-24 ENCOUNTER — Ambulatory Visit: Payer: Medicare Other | Attending: Cardiology | Admitting: Cardiology

## 2023-03-24 ENCOUNTER — Ambulatory Visit (INDEPENDENT_AMBULATORY_CARE_PROVIDER_SITE_OTHER): Payer: Medicare Other

## 2023-03-24 VITALS — BP 128/88 | HR 70 | Ht 71.0 in | Wt 215.8 lb

## 2023-03-24 DIAGNOSIS — R079 Chest pain, unspecified: Secondary | ICD-10-CM

## 2023-03-24 DIAGNOSIS — E785 Hyperlipidemia, unspecified: Secondary | ICD-10-CM

## 2023-03-24 DIAGNOSIS — R002 Palpitations: Secondary | ICD-10-CM | POA: Diagnosis not present

## 2023-03-24 DIAGNOSIS — Z720 Tobacco use: Secondary | ICD-10-CM | POA: Diagnosis not present

## 2023-03-24 MED ORDER — METOPROLOL TARTRATE 100 MG PO TABS: ORAL_TABLET | ORAL | 0 refills | Status: DC

## 2023-03-24 NOTE — Patient Instructions (Addendum)
Medication Instructions:  On the morning of the Coronary CTA, you are NOT to ake the Diltiazem 180mg . You are to take the Lopressor 100mg  ONLY. That medication has bee sent to your local pharmacy.   *If you need a refill on your cardiac medications before your next appointment, please call your pharmacy*   Lab Work: BMET If you have labs (blood work) drawn today and your tests are completely normal, you will receive your results only by: MyChart Message (if you have MyChart) OR A paper copy in the mail If you have any lab test that is abnormal or we need to change your treatment, we will call you to review the results.   Testing/Procedures:  Christena Deem- Long Term Monitor Instructions   Your physician has requested you wear your ZIO patch monitor 3 days.   This is a single patch monitor.  [employer] supplies one patch monitor per enrollment.  Additional stickers are not available.   Please do not apply patch if you will be having a Nuclear Stress Test, Echocardiogram, Cardiac CT, MRI, or Chest Xray during the time frame you would be wearing the monitor. The patch cannot be worn during these tests.  You cannot remove and re-apply the ZIO XT patch monitor.   Your ZIO patch monitor will be sent USPS Priority mail from [facility] directly to your home address. The monitor may also be mailed to a PO BOX if home delivery is not available.   It may take 3-5 days to receive your monitor after you have been enrolled.   Once you have received you monitor, please review enclosed instructions.  Your monitor has already been registered assigning a specific monitor serial # to you.   Applying the monitor   Shave hair from upper left chest.   Hold abrader disc by orange tab.  Rub abrader in 40 strokes over left upper chest as indicated in your monitor instructions.   Clean area with 4 enclosed alcohol pads .  Use all pads to assure are is cleaned thoroughly.  Let dry.   Apply patch as  indicated in monitor instructions.  Patch will be place under collarbone on left side of chest with arrow pointing upward.   Rub patch adhesive wings for 2 minutes.Remove white label marked "1".  Remove white label marked "2".  Rub patch adhesive wings for 2 additional minutes.   While looking in a mirror, press and release button in center of patch.  A small green light will flash 3-4 times .  This will be your only indicator the monitor has been turned on.     Do not shower for the first 24 hours.  You may shower after the first 24 hours.   Press button if you feel a symptom. You will hear a small click.  Record Date, Time and Symptom in the Patient Log Book.   When you are ready to remove patch, follow instructions on last 2 pages of Patient Log Book.  Stick patch monitor onto last page of Patient Log Book.   Place Patient Log Book in [place] box.  Use locking tab on box and tape box closed securely.  The [employer] and [employer] has [employer] on it.  Please place in mailbox as soon as possible.  Your physician should have your test results approximately 7 days after the monitor has been mailed back to [facility].   Call [facility] Customer Care at [phone] if you have questions regarding your ZIO XT  patch monitor.  Call them immediately if you see an orange light blinking on your monitor.   If your monitor falls off in less than 4 days contact our Monitor department at [phone].  If your monitor becomes loose or falls off after 4 days call [employer] at [phone] for suggestions on securing your monitor.         Your cardiac CT will be scheduled at one of the below locations:   [facility] [address] [place], [place] [address] [phone]  OR  [facility] [address] [place], [place] [address] [phone]  OR   [facility] [address] [place], [place]  [address] [phone]  If scheduled at [facility], please arrive at the [facility] and Children's Entrance (Entrance C2) of [facility] 30 minutes prior to test start time. You can use the FREE valet parking offered at entrance C (encouraged to control the heart rate for the test)  Proceed to the [facility] Radiology Department (first floor) to check-in and test prep.  All radiology patients and guests should use entrance C2 at [facility], accessed from [facility], even though the hospital's physical address listed is [address]    If scheduled at [facility] or [facility], please arrive 15 mins early for check-in and test prep.  There is spacious parking and easy access to the radiology department from the [facility] entrance. Please enter here and check-in with the desk attendant.   Please follow these instructions carefully (unless otherwise directed):  An IV will be required for this test and Nitroglycerin will be given.  Hold all erectile dysfunction medications at least 3 days (72 hrs) prior to test. (Ie viagra, cialis, sildenafil, tadalafil, etc)   On the Night Before the Test: Be sure to Drink plenty of water. Do not consume any caffeinated/decaffeinated beverages or chocolate 12 hours prior to your test. Do not take any antihistamines 12 hours prior to your test. If the patient has contrast allergy: Patient will need a prescription for Prednisone and very clear instructions (as follows): Prednisone 50 mg - take 13 hours prior to test Take another Prednisone 50 mg 7 hours prior to test Take another Prednisone 50 mg 1 hour prior to test Take Benadryl 50 mg 1 hour prior to test Patient must complete all four doses of above prophylactic medications. Patient will need a ride after test due to Benadryl.  On the Day of the Test: Drink plenty of water until 1 hour prior to  the test. Do not eat any food 1 hour prior to test. You may take your regular medications prior to the test.  Take metoprolol (Lopressor) two hours prior to test. If you take Furosemide/Hydrochlorothiazide/Spironolactone, please HOLD on the morning of the test.       After the Test: Drink plenty of water. After receiving IV contrast, you may experience a mild flushed feeling. This is normal. On occasion, you may experience a mild rash up to 24 hours after the test. This is not dangerous. If this occurs, you can take Benadryl 25 mg and increase your fluid intake. If you experience trouble breathing, this can be serious. If it is severe call 911 IMMEDIATELY. If it is mild, please call our office. If you take any of these medications: Glipizide/Metformin, Avandament, Glucavance, please do not take 48 hours after completing test unless  otherwise instructed.  We will call to schedule your test 2-4 weeks out understanding that some insurance companies will need an authorization prior to the service being performed.   For more information and frequently asked questions, please visit our website : [email or web address]  For non-scheduling related questions, please contact the cardiac imaging nurse navigator should you have any questions/concerns: Cardiac Imaging Nurse Navigators Direct Office Dial: [phone]   For scheduling needs, including cancellations and rescheduling, please call [place], [phone].    Follow-Up: At [facility], you and your health needs are our priority.  As part of our continuing mission to provide you with exceptional heart care, we have created designated Provider Care Teams.  These Care Teams include your primary Cardiologist (physician) and Advanced Practice Providers (APPs -  Physician Assistants and Nurse Practitioners) who all work together to provide you with the care you need, when you need it.  We recommend signing up for the patient  portal called "MyChart".  Sign up information is provided on this After Visit Summary.  MyChart is used to connect with patients for Virtual Visits (Telemedicine).  Patients are able to view lab/test results, encounter notes, upcoming appointments, etc.  Non-urgent messages can be sent to your provider as well.   To learn more about what you can do with MyChart, go to [email or web address].    Your next appointment:   4 month(s)  Provider:   DR. Percell Belt

## 2023-03-24 NOTE — Progress Notes (Unsigned)
Enrolled patient for a 3 day Zio XT monitor to be mailed to patients home  

## 2023-03-25 LAB — BASIC METABOLIC PANEL WITH GFR
BUN/Creatinine Ratio: 13 (ref 9–20)
BUN: 13 mg/dL (ref 6–24)
CO2: 25 mmol/L (ref 20–29)
Calcium: 9.4 mg/dL (ref 8.7–10.2)
Chloride: 106 mmol/L (ref 96–106)
Creatinine, Ser: 1 mg/dL (ref 0.76–1.27)
Glucose: 95 mg/dL (ref 70–99)
Potassium: 4.2 mmol/L (ref 3.5–5.2)
Sodium: 144 mmol/L (ref 134–144)
eGFR: 91 mL/min/1.73

## 2023-03-27 DIAGNOSIS — R002 Palpitations: Secondary | ICD-10-CM

## 2023-04-04 DIAGNOSIS — R002 Palpitations: Secondary | ICD-10-CM | POA: Diagnosis not present

## 2023-04-07 ENCOUNTER — Encounter (HOSPITAL_COMMUNITY): Payer: Self-pay

## 2023-04-11 ENCOUNTER — Ambulatory Visit (HOSPITAL_COMMUNITY)
Admission: RE | Admit: 2023-04-11 | Discharge: 2023-04-11 | Disposition: A | Discharge status: Home / Self Care | Payer: Medicare Other | Source: Ambulatory Visit | Attending: Cardiology | Admitting: Cardiology

## 2023-04-11 DIAGNOSIS — R072 Precordial pain: Secondary | ICD-10-CM | POA: Diagnosis not present

## 2023-04-11 DIAGNOSIS — I517 Cardiomegaly: Secondary | ICD-10-CM | POA: Diagnosis not present

## 2023-04-11 DIAGNOSIS — R079 Chest pain, unspecified: Secondary | ICD-10-CM | POA: Insufficient documentation

## 2023-04-11 DIAGNOSIS — I2510 Atherosclerotic heart disease of native coronary artery without angina pectoris: Secondary | ICD-10-CM | POA: Insufficient documentation

## 2023-04-11 MED ORDER — NITROGLYCERIN 0.4 MG SL SUBL
0.8000 mg | SUBLINGUAL_TABLET | Freq: Once | SUBLINGUAL | Status: AC
  Administered 2023-04-11: 0.8 mg via SUBLINGUAL

## 2023-04-11 MED ORDER — NITROGLYCERIN 0.4 MG SL SUBL
SUBLINGUAL_TABLET | SUBLINGUAL | Status: AC
  Filled 2023-04-11: qty 2

## 2023-04-11 MED ORDER — IOHEXOL 350 MG/ML SOLN
95.0000 mL | Freq: Once | INTRAVENOUS | Status: AC | PRN
  Administered 2023-04-11: 95 mL via INTRAVENOUS

## 2023-04-12 ENCOUNTER — Encounter: Payer: Self-pay | Admitting: Family

## 2023-04-13 DIAGNOSIS — G894 Chronic pain syndrome: Secondary | ICD-10-CM | POA: Diagnosis not present

## 2023-04-13 DIAGNOSIS — M5417 Radiculopathy, lumbosacral region: Secondary | ICD-10-CM | POA: Diagnosis not present

## 2023-04-13 DIAGNOSIS — M961 Postlaminectomy syndrome, not elsewhere classified: Secondary | ICD-10-CM | POA: Diagnosis not present

## 2023-04-13 DIAGNOSIS — M546 Pain in thoracic spine: Secondary | ICD-10-CM | POA: Diagnosis not present

## 2023-04-13 DIAGNOSIS — M47816 Spondylosis without myelopathy or radiculopathy, lumbar region: Secondary | ICD-10-CM | POA: Diagnosis not present

## 2023-04-14 ENCOUNTER — Other Ambulatory Visit: Payer: Self-pay | Admitting: Family

## 2023-04-14 ENCOUNTER — Other Ambulatory Visit: Payer: Self-pay

## 2023-04-14 DIAGNOSIS — R0689 Other abnormalities of breathing: Secondary | ICD-10-CM

## 2023-04-14 DIAGNOSIS — E782 Mixed hyperlipidemia: Secondary | ICD-10-CM

## 2023-04-14 DIAGNOSIS — R002 Palpitations: Secondary | ICD-10-CM

## 2023-04-24 DIAGNOSIS — R053 Chronic cough: Secondary | ICD-10-CM | POA: Diagnosis not present

## 2023-04-24 DIAGNOSIS — R400 Somnolence: Secondary | ICD-10-CM | POA: Diagnosis not present

## 2023-04-24 DIAGNOSIS — R0601 Orthopnea: Secondary | ICD-10-CM | POA: Diagnosis not present

## 2023-04-24 DIAGNOSIS — R0602 Shortness of breath: Secondary | ICD-10-CM | POA: Diagnosis not present

## 2023-04-24 DIAGNOSIS — I5189 Other ill-defined heart diseases: Secondary | ICD-10-CM | POA: Diagnosis not present

## 2023-05-22 DIAGNOSIS — R400 Somnolence: Secondary | ICD-10-CM | POA: Diagnosis not present

## 2023-06-08 DIAGNOSIS — G894 Chronic pain syndrome: Secondary | ICD-10-CM | POA: Diagnosis not present

## 2023-06-08 DIAGNOSIS — M5417 Radiculopathy, lumbosacral region: Secondary | ICD-10-CM | POA: Diagnosis not present

## 2023-06-08 DIAGNOSIS — M961 Postlaminectomy syndrome, not elsewhere classified: Secondary | ICD-10-CM | POA: Diagnosis not present

## 2023-06-08 DIAGNOSIS — M533 Sacrococcygeal disorders, not elsewhere classified: Secondary | ICD-10-CM | POA: Diagnosis not present

## 2023-06-08 DIAGNOSIS — M47816 Spondylosis without myelopathy or radiculopathy, lumbar region: Secondary | ICD-10-CM | POA: Diagnosis not present

## 2023-06-26 DIAGNOSIS — E782 Mixed hyperlipidemia: Secondary | ICD-10-CM | POA: Diagnosis not present

## 2023-06-26 DIAGNOSIS — R002 Palpitations: Secondary | ICD-10-CM | POA: Diagnosis not present

## 2023-06-27 LAB — COMPREHENSIVE METABOLIC PANEL WITH GFR
ALT: 23 IU/L (ref 0–44)
AST: 14 IU/L (ref 0–40)
Albumin: 4.7 g/dL (ref 3.8–4.9)
Alkaline Phosphatase: 125 IU/L — ABNORMAL HIGH (ref 44–121)
BUN/Creatinine Ratio: 14 (ref 9–20)
BUN: 14 mg/dL (ref 6–24)
Bilirubin Total: 1.2 mg/dL (ref 0.0–1.2)
CO2: 24 mmol/L (ref 20–29)
Calcium: 9.6 mg/dL (ref 8.7–10.2)
Chloride: 103 mmol/L (ref 96–106)
Creatinine, Ser: 0.98 mg/dL (ref 0.76–1.27)
Globulin, Total: 2.7 g/dL (ref 1.5–4.5)
Glucose: 93 mg/dL (ref 70–99)
Potassium: 4.1 mmol/L (ref 3.5–5.2)
Sodium: 143 mmol/L (ref 134–144)
Total Protein: 7.4 g/dL (ref 6.0–8.5)
eGFR: 93 mL/min/1.73

## 2023-06-27 LAB — LIPID PANEL
Chol/HDL Ratio: 3 ratio (ref 0.0–5.0)
Cholesterol, Total: 150 mg/dL (ref 100–199)
HDL: 50 mg/dL
LDL Chol Calc (NIH): 84 mg/dL (ref 0–99)
Triglycerides: 82 mg/dL (ref 0–149)
VLDL Cholesterol Cal: 16 mg/dL (ref 5–40)

## 2023-07-19 NOTE — Progress Notes (Unsigned)
 Cardiology Office Note:    Date:  07/21/2023   ID:  Kevin Jackson, DOB [birth year], MRN [id number]  PCP:  Olive Bass, FNP  Cardiologist:  Thomasene Ripple, DO  Electrophysiologist:  None   Referring MD: Olive Bass,*   Chief Complaint  Patient presents with   Palpitations    History of Present Illness:    Kevin Jackson is a 53 y.o. male with a hx of SVT, hypertension, hyperlipidemia who presents for follow-up.  He was initially seen by Dr. Servando Salina for evaluation of palpitations in April 2021.  Zio patch x14 days was done, which showed 3 runs of SVT, longest lasting 14 seconds, as well as symptomatic PVCs.  He was started on Cardizem 120 mg daily with improvement in his palpitations.  Echocardiogram 08/27/2015 showed LVEF 60 to 65%, grade 1 diastolic dysfunction, mild RV dilatation, mild MR.  ETT 08/18/2015 with negative for ischemia.  Echocardiogram 05/13/2020 showed EF 65 to 70%, normal diastolic function, normal RV function, no significant valvular disease.  Coronary CTA on 04/12/2023 showed minimal CAD, calcium score 1 (53rd percentile).  Zio patch x 3 days 03/2023 showed no significant arrhythmias.  Since last clinic visit, he reports he has continued to have palpitations.  Can occur multiple times per day and just last for a second.  He is drinking at least 2 cups of coffee per day.  He quit using chewing tobacco for about 2 weeks but then restarted.  He has cut back from 1 can per day to half a can.  Reports recent episode of neck pain that radiated to the chest, just lasted for few seconds.   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, back , legs- + neuropathy   Numerous moles 01/19/2012   Shortened PR interval 08/01/2015   Supraventricular tachycardia (HCC) 09/17/2019    Past Surgical History:  Procedure Laterality Date   APPENDECTOMY     BACK SURGERY     L3L4 fusion   CYSTOSCOPY/URETEROSCOPY/HOLMIUM LASER/STENT PLACEMENT Right 07/16/2020   Procedure: CYSTOSCOPY/URETEROSCOPY/HOLMIUM LASER/STENT PLACEMENT;  Surgeon: Belva Agee, MD;  Location: [facility];  Service: Urology;  Laterality: Right;   HERNIA REPAIR     INCISIONAL HERNIA REPAIR N/A 05/12/2021   Procedure: INCISIONAL HERNIA REPAIR WITH MESH;  Surgeon: Harriette Bouillon, MD;  Location: [facility];  Service: General;  Laterality: N/A;   LAPAROSCOPIC GASTROTOMY W/ REPAIR OF ULCER     MASS EXCISION N/A 01/22/2020   Procedure: EXCISION OF XIPHOID;  Surgeon: Kerin Perna, MD;  Location: [facility] OR;  Service: Thoracic;  Laterality: N/A;   pain stimulator  2023   right thoracotomy for exposure for the C7-T8 diskectomy  02/06/2007   Dr Edwyna Shell   TONSILLECTOMY      Current Medications: Current Meds  Medication Sig  atorvastatin (LIPITOR) 40 MG tablet Take 1 tablet (40 mg total) by mouth daily.   diltiazem (TIAZAC) 180 MG 24 hr capsule Take 1 capsule by mouth once daily   fluticasone (FLONASE) 50 MCG/ACT nasal spray Place 1 spray into both nostrils daily.   HYDROcodone-acetaminophen (NORCO) 10-325 MG tablet Take 1 tablet by mouth every 6 (six) hours as needed.   pantoprazole (PROTONIX) 40 MG tablet Take 1 tablet (40 mg total) by mouth daily.   pregabalin (LYRICA) 150 MG capsule Take 150 mg by mouth 2 (two) times daily.   tadalafil (CIALIS) 20 MG tablet Take 1 tablet (20 mg total) by mouth daily as needed for erectile  dysfunction.     Allergies:   Bee venom   Social History   Socioeconomic History   Marital status: Married    Spouse name: Not on file   Number of children: Not on file   Years of education: Not on file   Highest education level: Not on file  Occupational History    Employer: [employer]  Tobacco Use   Smoking status: Former    Current packs/day: 0.00    Average packs/day: 1 pack/day for 2.0 years (2.0 ttl pk-yrs)    Types: Cigarettes    Start date: 05/24/1995    Quit date: 05/23/1997    Years since quitting: 26.1   Smokeless tobacco: Former  Building services engineer status: Never Used  Substance and Sexual Activity   Alcohol use: Yes    Alcohol/week: 6.0 standard drinks of alcohol    Types: 6 Cans of beer per week    Comment: social   Drug use: Not Currently   Sexual activity: Yes    Partners: Female    Comment: MARRIED  Other Topics Concern   Not on file  Social History Narrative   Not on file   Social Drivers of Health   Financial Resource Strain: Not on file  Food Insecurity: Not on file  Transportation Needs: Not on file  Physical Activity: Not on file  Stress: Not on file  Social Connections: Unknown (09/19/2021)   Received from [facility]   Social Network    Social Network: Not on file     Family History: The patient's  family history includes Asthma in his mother; Cancer in his paternal uncle; Colon polyps in his maternal uncle; Healthy in his brother, sister, and son; Hypertension in his mother; Other in his brother; Scoliosis in his daughter. There is no history of Colon cancer, Esophageal cancer, Rectal cancer, or Stomach cancer.  ROS:   Please see the history of present illness.     All other systems reviewed and are negative.  EKGs/Labs/Other Studies Reviewed:    The following studies were reviewed today:   EKG:   03/24/23: Normal sinus rhythm, rate 70, no ST abnormalities   Recent Labs: 12/09/2022: Hemoglobin 16.7; Platelets  291 06/26/2023: ALT 23; BUN 14; Creatinine, Ser 0.98; Potassium 4.1; Sodium 143  Recent Lipid Panel    Component Value Date/Time   CHOL 150 06/26/2023 0837   TRIG 82 06/26/2023 0837   HDL 50 06/26/2023 0837   CHOLHDL 3.0 06/26/2023 0837   CHOLHDL 3.2 12/09/2022 1406   VLDL 24.8 07/27/2021 1127   LDLCALC 84 06/26/2023 0837   LDLCALC 79 12/09/2022 1406    Physical Exam:    VS:  BP 124/84   Pulse 86   Ht 5\' 10"  (1.778 m)   Wt 209 lb 12.8 oz (95.2  kg)   SpO2 95%   BMI 30.10 kg/m     Wt Readings from Last 3 Encounters:  07/21/23 209 lb 12.8 oz (95.2 kg)  03/24/23 215 lb 12.8 oz (97.9 kg)  12/09/22 210 lb 6.4 oz (95.4 kg)     GEN:  Well nourished, well developed in no acute distress HEENT: Normal NECK: No JVD; No carotid bruits CARDIAC: RRR, no murmurs, rubs, gallops RESPIRATORY:  Clear to auscultation without rales, wheezing or rhonchi  ABDOMEN: Soft, non-tender, non-distended MUSCULOSKELETAL:  No edema; No deformity  SKIN: Warm and dry NEUROLOGIC:  Alert and oriented x 3 PSYCHIATRIC:  Normal affect   ASSESSMENT:    1. Coronary artery disease involving native coronary artery of native heart without angina pectoris   2. Palpitations   3. Hyperlipidemia, unspecified hyperlipidemia type   4. Tobacco use      PLAN:    CAD: Reported atypical chest pain.  Coronary CTA on 04/12/2023 showed minimal CAD, calcium score 1 (53rd percentile).  Zio patch x 3 days 03/2023 showed no significant arrhythmias. -Continue atorvastatin  Palpitations: Zio patch 09/2019 showed short episodes of SVT, up to 14 seconds.   Echocardiogram 05/13/2020 showed EF 65 to 70%, normal diastolic function, normal RV function, no significant valvular disease.  Appears to be symptomatic from PVCs.  Reports symptoms have improved with diltiazem but continues to have palpitations.  Zio patch x 3 days 03/2023 showed no significant arrhythmias, rare PVCs -Continue diltiazem 180 mg daily -Recommend tobacco  cessation and decreasing caffeine intake to 1 cup of coffee per day  Hyperlipidemia: On atorvastatin 40 mg daily.  LDL 84 on 06/26/2023  Daytime somnolence/observed apnea events: Sleep study 05/2020 showed no significant OSA  Tobacco use: Uses chewing tobacco.  Cessation strongly recommended.  RTC in 6 months  Medication Adjustments/Labs and Tests Ordered: Current medicines are reviewed at length with the patient today.  Concerns regarding medicines are outlined above.  No orders of the defined types were placed in this encounter.  No orders of the defined types were placed in this encounter.   Patient Instructions  Medication Instructions:  Continue current medications *If you need a refill on your cardiac medications before your next appointment, please call your pharmacy*   Lab Work: none If you have labs (blood work) drawn today and your tests are completely normal, you will receive your results only by: MyChart Message (if you have MyChart) OR A paper copy in the mail If you have any lab test that is abnormal or we need to change your treatment, we will call you to review the results.   Testing/Procedures: none   Follow-Up: At [facility], you and your health needs are our priority.  As part of our continuing mission to provide you with exceptional heart care, we have created designated Provider Care Teams.  These Care Teams include your primary Cardiologist (physician) and Advanced Practice Providers (APPs -  Physician Assistants and Nurse Practitioners) who all work together to provide you with the care you need, when you need it.  We recommend signing up for the patient portal called "MyChart".  Sign up information is provided on this After Visit Summary.  MyChart is used to connect with patients for Virtual Visits (Telemedicine).  Patients are able to view lab/test results, encounter notes, upcoming appointments, etc.  Non-urgent messages can be sent to your  provider as well.   To learn more about what you can do with MyChart, go to [email or web address].  Your next appointment:   6 month(s)  Provider:   Dr. Bjorn Pippin   Other Instructions none       Signed, Little Ishikawa, MD  07/21/2023 9:47 AM    [facility]

## 2023-07-21 ENCOUNTER — Encounter: Payer: Self-pay | Admitting: Cardiology

## 2023-07-21 ENCOUNTER — Ambulatory Visit: Payer: Medicare Other | Attending: Cardiology | Admitting: Cardiology

## 2023-07-21 VITALS — BP 124/84 | HR 86 | Ht 70.0 in | Wt 209.8 lb

## 2023-07-21 DIAGNOSIS — Z720 Tobacco use: Secondary | ICD-10-CM | POA: Diagnosis not present

## 2023-07-21 DIAGNOSIS — R002 Palpitations: Secondary | ICD-10-CM

## 2023-07-21 DIAGNOSIS — E785 Hyperlipidemia, unspecified: Secondary | ICD-10-CM

## 2023-07-21 DIAGNOSIS — I2510 Atherosclerotic heart disease of native coronary artery without angina pectoris: Secondary | ICD-10-CM | POA: Diagnosis not present

## 2023-07-21 NOTE — Patient Instructions (Signed)
 Medication Instructions:  Continue current medications *If you need a refill on your cardiac medications before your next appointment, please call your pharmacy*   Lab Work: none If you have labs (blood work) drawn today and your tests are completely normal, you will receive your results only by: MyChart Message (if you have MyChart) OR A paper copy in the mail If you have any lab test that is abnormal or we need to change your treatment, we will call you to review the results.   Testing/Procedures: none   Follow-Up: At [facility], you and your health needs are our priority.  As part of our continuing mission to provide you with exceptional heart care, we have created designated Provider Care Teams.  These Care Teams include your primary Cardiologist (physician) and Advanced Practice Providers (APPs -  Physician Assistants and Nurse Practitioners) who all work together to provide you with the care you need, when you need it.  We recommend signing up for the patient portal called "MyChart".  Sign up information is provided on this After Visit Summary.  MyChart is used to connect with patients for Virtual Visits (Telemedicine).  Patients are able to view lab/test results, encounter notes, upcoming appointments, etc.  Non-urgent messages can be sent to your provider as well.   To learn more about what you can do with MyChart, go to [email or web address].    Your next appointment:   6 month(s)  Provider:   Dr.Schumann  Other Instructions none

## 2023-07-25 DIAGNOSIS — R053 Chronic cough: Secondary | ICD-10-CM | POA: Diagnosis not present

## 2023-08-01 DIAGNOSIS — R053 Chronic cough: Secondary | ICD-10-CM | POA: Diagnosis not present

## 2023-08-01 DIAGNOSIS — R400 Somnolence: Secondary | ICD-10-CM | POA: Diagnosis not present

## 2023-08-01 DIAGNOSIS — R0683 Snoring: Secondary | ICD-10-CM | POA: Diagnosis not present

## 2023-08-01 DIAGNOSIS — R0602 Shortness of breath: Secondary | ICD-10-CM | POA: Diagnosis not present

## 2023-08-03 DIAGNOSIS — M5417 Radiculopathy, lumbosacral region: Secondary | ICD-10-CM | POA: Diagnosis not present

## 2023-08-03 DIAGNOSIS — M961 Postlaminectomy syndrome, not elsewhere classified: Secondary | ICD-10-CM | POA: Diagnosis not present

## 2023-08-03 DIAGNOSIS — Z79899 Other long term (current) drug therapy: Secondary | ICD-10-CM | POA: Diagnosis not present

## 2023-08-03 DIAGNOSIS — M47816 Spondylosis without myelopathy or radiculopathy, lumbar region: Secondary | ICD-10-CM | POA: Diagnosis not present

## 2023-08-03 DIAGNOSIS — G894 Chronic pain syndrome: Secondary | ICD-10-CM | POA: Diagnosis not present

## 2023-08-03 DIAGNOSIS — Z5181 Encounter for therapeutic drug level monitoring: Secondary | ICD-10-CM | POA: Diagnosis not present

## 2023-08-14 ENCOUNTER — Telehealth: Payer: Self-pay | Admitting: Family

## 2023-08-14 DIAGNOSIS — G4733 Obstructive sleep apnea (adult) (pediatric): Secondary | ICD-10-CM | POA: Diagnosis not present

## 2023-08-14 NOTE — Telephone Encounter (Signed)
 Copied from CRM [id number]. Topic: Medicare AWV >> Aug 14, 2023 10:08 AM Payton Doughty wrote: Reason for CRM: Called 08/14/2023 to sched AWV - Line Busy   Verlee Rossetti; Care Guide Ambulatory Clinical Support [place] [facility] Direct Dial: [phone]

## 2023-08-15 DIAGNOSIS — G4733 Obstructive sleep apnea (adult) (pediatric): Secondary | ICD-10-CM | POA: Diagnosis not present

## 2023-09-28 DIAGNOSIS — M961 Postlaminectomy syndrome, not elsewhere classified: Secondary | ICD-10-CM | POA: Diagnosis not present

## 2023-09-28 DIAGNOSIS — M5417 Radiculopathy, lumbosacral region: Secondary | ICD-10-CM | POA: Diagnosis not present

## 2023-09-28 DIAGNOSIS — G894 Chronic pain syndrome: Secondary | ICD-10-CM | POA: Diagnosis not present

## 2023-09-28 DIAGNOSIS — M47816 Spondylosis without myelopathy or radiculopathy, lumbar region: Secondary | ICD-10-CM | POA: Diagnosis not present

## 2023-11-16 ENCOUNTER — Other Ambulatory Visit: Payer: Self-pay | Admitting: Family

## 2023-11-19 ENCOUNTER — Other Ambulatory Visit: Payer: Self-pay | Admitting: Family

## 2023-12-06 ENCOUNTER — Encounter: Payer: Self-pay | Admitting: Family

## 2023-12-08 ENCOUNTER — Other Ambulatory Visit: Payer: Self-pay | Admitting: Family

## 2023-12-17 ENCOUNTER — Other Ambulatory Visit: Payer: Self-pay | Admitting: Family

## 2023-12-19 DIAGNOSIS — M47816 Spondylosis without myelopathy or radiculopathy, lumbar region: Secondary | ICD-10-CM | POA: Diagnosis not present

## 2023-12-19 DIAGNOSIS — M5417 Radiculopathy, lumbosacral region: Secondary | ICD-10-CM | POA: Diagnosis not present

## 2023-12-19 DIAGNOSIS — M961 Postlaminectomy syndrome, not elsewhere classified: Secondary | ICD-10-CM | POA: Diagnosis not present

## 2023-12-19 DIAGNOSIS — G894 Chronic pain syndrome: Secondary | ICD-10-CM | POA: Diagnosis not present

## 2023-12-25 DIAGNOSIS — D229 Melanocytic nevi, unspecified: Secondary | ICD-10-CM | POA: Diagnosis not present

## 2023-12-25 DIAGNOSIS — L570 Actinic keratosis: Secondary | ICD-10-CM | POA: Diagnosis not present

## 2023-12-25 DIAGNOSIS — L821 Other seborrheic keratosis: Secondary | ICD-10-CM | POA: Diagnosis not present

## 2023-12-25 DIAGNOSIS — L814 Other melanin hyperpigmentation: Secondary | ICD-10-CM | POA: Diagnosis not present

## 2024-01-01 DIAGNOSIS — G4733 Obstructive sleep apnea (adult) (pediatric): Secondary | ICD-10-CM | POA: Diagnosis not present

## 2024-01-01 DIAGNOSIS — R053 Chronic cough: Secondary | ICD-10-CM | POA: Diagnosis not present

## 2024-01-09 ENCOUNTER — Other Ambulatory Visit: Payer: Self-pay | Admitting: Family

## 2024-01-12 ENCOUNTER — Ambulatory Visit: Admitting: *Deleted

## 2024-01-12 ENCOUNTER — Telehealth: Payer: Self-pay | Admitting: *Deleted

## 2024-01-12 VITALS — BP 120/86 | HR 66 | Temp 98.3°F | Resp 18 | Ht 70.0 in | Wt 201.8 lb

## 2024-01-12 DIAGNOSIS — Z0000 Encounter for general adult medical examination without abnormal findings: Secondary | ICD-10-CM

## 2024-01-12 MED ORDER — PANTOPRAZOLE SODIUM 40 MG PO TBEC: 40.0000 mg | DELAYED_RELEASE_TABLET | Freq: Every day | ORAL | 0 refills | Status: DC

## 2024-01-12 NOTE — Patient Instructions (Signed)
 Kevin Jackson , Thank you for taking time out of your busy schedule to complete your Annual Wellness Visit with me. I enjoyed our conversation and look forward to speaking with you again next year. I, as well as your care team,  appreciate your ongoing commitment to your health goals. Please review the following plan we discussed and let me know if I can assist you in the future. Your Game plan/ To Do List    Follow up Visits: Next Medicare AWV with our clinical staff:  Let us  know if you decide to stay and we can schedule the wellness visit for next year.  Next Office Visit with your provider:   Let us  know if you decide to see someone else in our practice and we will get you scheduled.  Clinician Recommendations:  Aim for 30 minutes of exercise or brisk walking, 6-8 glasses of water , and 5 servings of fruits and vegetables each day.       This is a list of the screening recommended for you and due dates:  Health Maintenance  Topic Date Due   Medicare Annual Wellness Visit  Never done   HIV Screening  Never done   Hepatitis C Screening  Never done   Pneumococcal Vaccine for age over 24 (1 of 2 - PCV) 01/11/2025*   Flu Shot  01/11/2025*   Hepatitis B Vaccine (1 of 3 - 19+ 3-dose series) 01/11/2025*   Zoster (Shingles) Vaccine (1 of 2) 01/11/2025*   Colon Cancer Screening  01/27/2025   DTaP/Tdap/Td vaccine (7 - Td or Tdap) 03/06/2030   HPV Vaccine  Aged Out   Meningitis B Vaccine  Aged Out   COVID-19 Vaccine  Discontinued  *Topic was postponed. The date shown is not the original due date.    Advanced directives: (Copy Requested) Please bring a copy of your health care power of attorney and living will to the office to be added to your chart at your convenience. You can mail to [facility] [address] [place], [place] [address] or email to [email or web address] Advance Care Planning is important because it:  [x]  Makes sure you receive the medical care that is  consistent with your values, goals, and preferences  [x]  It provides guidance to your family and loved ones and reduces their decisional burden about whether or not they are making the right decisions based on your wishes.  Follow the link provided in your after visit summary or read over the paperwork we have mailed to you to help you started getting your Advance Directives in place. If you need assistance in completing these, please reach out to us  so that we can help you!  See attachments for Preventive Care and Fall Prevention Tips.

## 2024-01-12 NOTE — Telephone Encounter (Signed)
 PT had AWV today. He scored a 10 on depression screen and contributes this to frustration over current health status. He declines need for therapy/counseling or other treatment. Feels he manages his symptoms well with coping skills and communication with spouse. Advised him to reach out to us  if this worsens and he voices understanding.  He notes memory concerns. Frequently forgets train of thought in middle of conversation. Forgets why he entered a specific room.  6-cit was normal. Pt has made pain management aware in case it is medication related.  Pt is due for a cpe and I was not able to get him in with you before you leave. He will discuss with spouse and decide if they will stay here or seek care elsewhere.    Refill request for Protonix  sent today.   FYI: pt reports continued difficulty with O2% dropping at night to the 80s if he sleeps on his back. Currently working with Pulmonology but hasn't found an effective treatment yet and pt feels cause is still vague/unclear.

## 2024-01-12 NOTE — Progress Notes (Signed)
 Subjective:   Kevin Jackson is a 53 y.o. who presents for a Medicare Wellness preventive visit.  As a reminder, Annual Wellness Visits don't include a physical exam, and some assessments may be limited, especially if this visit is performed virtually. We may recommend an in-person follow-up visit with your provider if needed.  Visit Complete: In person  Persons Participating in Visit: Patient.  AWV Questionnaire: No: Patient Medicare AWV questionnaire was not completed prior to this visit.  Cardiac Risk Factors include: dyslipidemia;hypertension;Other (see comment), Risk factor comments: SVT     Objective:    Today's Vitals   01/12/24 0845  BP: 120/86  Pulse: 66  Resp: 18  Temp: 98.3 F (36.8 C)  TempSrc: Oral  SpO2: 97%  Weight: 201 lb 12.8 oz (91.5 kg)  Height: 5' 10 (1.778 m)   Body mass index is 28.96 kg/m.     01/12/2024    9:21 AM 05/12/2021    1:11 PM 05/05/2021    3:08 PM 04/17/2021    1:16 PM 07/16/2020    1:05 PM 07/15/2020    8:00 AM 06/04/2020    8:04 PM  Advanced Directives  Does Patient Have a Medical Advance Directive? No No No No No No No  Would patient like information on creating a medical advance directive? Yes (MAU/Ambulatory/Procedural Areas - Information given) No - Patient declined No - Patient declined  No - Patient declined  No - Patient declined    Current Medications (verified) Outpatient Encounter Medications as of 01/12/2024  Medication Sig   atorvastatin  (LIPITOR) 40 MG tablet TAKE 1 TABLET BY MOUTH ONCE DAILY . APPOINTMENT REQUIRED FOR FUTURE REFILLS   diltiazem  (TIAZAC ) 180 MG 24 hr capsule Take 1 capsule by mouth once daily   fluticasone  (FLONASE ) 50 MCG/ACT nasal spray Place 1 spray into both nostrils daily.   HYDROcodone -acetaminophen  (NORCO) 10-325 MG tablet Take 1 tablet by mouth every 6 (six) hours as needed.   pregabalin (LYRICA) 150 MG capsule Take 150 mg by mouth 2 (two) times daily.   tadalafil  (CIALIS ) 20 MG  tablet Take 1 tablet (20 mg total) by mouth daily as needed for erectile dysfunction.   [DISCONTINUED] pantoprazole  (PROTONIX ) 40 MG tablet Take 1 tablet (40 mg total) by mouth daily. Needs appt   pantoprazole  (PROTONIX ) 40 MG tablet Take 1 tablet (40 mg total) by mouth daily. Needs appt   [DISCONTINUED] Gabapentin , PHN, 300 MG TABS Take 1 capsule by mouth 3 (three) times daily.   [DISCONTINUED] metoprolol  tartrate (LOPRESSOR ) 100 MG tablet Take 1 tablet (100mg ) TWO hours prior to CT scan (Patient not taking: Reported on 01/12/2024)   No facility-administered encounter medications on file as of 01/12/2024.    Allergies (verified) Bee venom   History: Past Medical History:  Diagnosis Date   Anxiety    Arthritis    hips and lower back   BACK PAIN, LUMBAR 08/06/2009   Qualifier: Diagnosis of  By: Kathryne CMA (AAMA), Rachel     Blood transfusion without reported diagnosis    CHEST PAIN 05/06/2010   Qualifier: Diagnosis of  By: Krystal MD, Reyes A    Chronic chest wall pain 08/28/2015   Depression    Dysrhythmia    GERD (gastroesophageal reflux disease)    History of kidney stones    Hypertension    Incisional hernia    Mixed hyperlipidemia 09/17/2019   Neuralgia    Neuromuscular disorder (HCC)    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, back , legs- + neuropathy  Numerous moles 01/19/2012   Shortened PR interval 08/01/2015   Supraventricular tachycardia (HCC) 09/17/2019   Past Surgical History:  Procedure Laterality Date   APPENDECTOMY     BACK SURGERY     L3L4 fusion   CYSTOSCOPY/URETEROSCOPY/HOLMIUM LASER/STENT PLACEMENT Right 07/16/2020   Procedure: CYSTOSCOPY/URETEROSCOPY/HOLMIUM LASER/STENT PLACEMENT;  Surgeon: Rosalind Zachary NOVAK, MD;  Location: [facility];  Service: Urology;  Laterality: Right;   HERNIA REPAIR     INCISIONAL HERNIA REPAIR N/A 05/12/2021   Procedure: INCISIONAL HERNIA REPAIR WITH MESH;  Surgeon: Vanderbilt Ned, MD;  Location: [facility];  Service: General;   Laterality: N/A;   LAPAROSCOPIC GASTROTOMY W/ REPAIR OF ULCER     MASS EXCISION N/A 01/22/2020   Procedure: EXCISION OF XIPHOID;  Surgeon: Fleeta Hanford Coy, MD;  Location[facility] OR;  Service: Thoracic;  Laterality: N/A;   pain stimulator  2023   right thoracotomy for exposure for the C7-T8 diskectomy  02/06/2007   Dr Brantley   TONSILLECTOMY     Family History  Problem Relation Age of Onset   Hypertension Mother    Asthma Mother    Healthy Sister    Healthy Brother    Other Brother        tumors   Colon polyps Maternal Uncle    Cancer Paternal Uncle        intestinal   Scoliosis Daughter    Healthy Son    Colon cancer Neg Hx    Esophageal cancer Neg Hx    Rectal cancer Neg Hx    Stomach cancer Neg Hx    Social History   Socioeconomic History   Marital status: Married    Spouse name: Not on file   Number of children: Not on file   Years of education: Not on file   Highest education level: Not on file  Occupational History    Employer: [employer]  Tobacco Use   Smoking status: Former    Current packs/day: 0.00    Average packs/day: 1 pack/day for 2.0 years (2.0 ttl pk-yrs)    Types: Cigarettes    Start date: 05/24/1995    Quit date: 05/23/1997    Years since quitting: 26.6   Smokeless tobacco: Former  Building services engineer status: Never Used  Substance and Sexual Activity   Alcohol use: Yes    Alcohol/week: 6.0 standard drinks of alcohol    Types: 6 Cans of beer per week    Comment: social, once every 6 months   Drug use: Not Currently   Sexual activity: Yes    Partners: Female    Comment: MARRIED  Other Topics Concern   Not on file  Social History Narrative   Not on file   Social Drivers of Health   Financial Resource Strain: Low Risk  (01/12/2024)   Overall Financial Resource Strain (CARDIA)    Difficulty of Paying Living Expenses: Not very hard  Food Insecurity: No Food Insecurity (01/12/2024)   Hunger Vital Sign    Worried About Running Out of Food in the  Last Year: Never true    Ran Out of Food in the Last Year: Never true  Transportation Needs: No Transportation Needs (01/12/2024)   PRAPARE - Administrator, Civil Service (Medical): No    Lack of Transportation (Non-Medical): No  Physical Activity: Insufficiently Active (01/12/2024)   Exercise Vital Sign    Days of Exercise per Week: 7 days    Minutes of Exercise per Session: 20  min  Stress: No Stress Concern Present (01/12/2024)   [employer] of Occupational Health - Occupational Stress Questionnaire    Feeling of Stress: Only a little  Social Connections: Socially Isolated (01/12/2024)   Social Connection and Isolation Panel    Frequency of Communication with Friends and Family: Once a week    Frequency of Social Gatherings with Friends and Family: Once a week    Attends Religious Services: Never    Database administrator or Organizations: No    Attends Engineer, structural: Never    Marital Status: Married    Tobacco Counseling Counseling given: Not Answered    Clinical Intake:  Pre-visit preparation completed: Yes        BMI - recorded: 28.96 Nutritional Status: BMI 25 -29 Overweight Diabetes: No  Lab Results  Component Value Date   HGBA1C 4.9 07/27/2021     How often do you need to have someone help you when you read instructions, pamphlets, or other written materials from your doctor or pharmacy?: 1 - Never  Interpreter Needed?: No  Information entered by :: Lolita Libra, CMA   Activities of Daily Living     01/12/2024    9:19 AM  In your present state of health, do you have any difficulty performing the following activities:  Hearing? 1  Comment has tinnitus bilaterally  Vision? 0  Difficulty concentrating or making decisions? 0  Walking or climbing stairs? 1  Comment can take a few but steps easily but trips if there are a lot  Dressing or bathing? 1  Comment needs assistance occasionally  Doing errands, shopping? 1   Preparing Food and eating ? N  Using the Toilet? N  In the past six months, have you accidently leaked urine? N  Do you have problems with loss of bowel control? N  Managing your Medications? N  Managing your Finances? N  Housekeeping or managing your Housekeeping? N    Patient Care Team: Jason Leita Repine, FNP as PCP - General (Internal Medicine) Tobb, Kardie, DO as PCP - Cardiology (Cardiology) [facility]  I have updated your Care Teams any recent Medical Services you may have received from other providers in the past year.     Assessment:   This is a routine wellness examination for [place].  Hearing/Vision screen Hearing Screening - Comments:: Has ringing in both ears x years and getting louder. Vision Screening - Comments:: Wears RX glasses -- up to date with routine eye exams at [facility] [address]    Goals Addressed   None    Depression Screen     01/12/2024    9:10 AM 12/09/2022    1:29 PM 11/19/2021    2:52 PM 07/27/2021   11:20 AM 09/17/2019    9:03 AM  PHQ 2/9 Scores  PHQ - 2 Score 3 1 0 5 6  PHQ- 9 Score 10   18 18     Fall Risk     01/12/2024    9:02 AM 12/09/2022    1:30 PM 11/19/2021    2:51 PM 07/27/2021   10:44 AM  Fall Risk   Falls in the past year? 1 0 0 1  Number falls in past yr: 1 0 0 1  Injury with Fall? 0 0 0 0  Risk for fall due to : Impaired balance/gait No Fall Risks    Follow up Education provided Falls evaluation completed      MEDICARE RISK AT HOME:  Medicare Risk at Home Any stairs in or around the home?: Yes If so, are there any without handrails?: Yes Home free of loose throw rugs in walkways, pet beds, electrical cords, etc?: Yes Adequate lighting in your home to reduce risk of falls?: Yes Life alert?: No Use of a cane, walker or w/c?: Yes (cane intermittently) Grab bars in the bathroom?: No Shower chair or bench in shower?: No Elevated toilet seat or a handicapped toilet?: Yes  TIMED  UP AND GO:  Was the test performed?  Yes  Length of time to ambulate 10 feet: 5 sec Gait slow and steady without use of assistive device  Cognitive Function: 6CIT completed        01/12/2024    9:22 AM  6CIT Screen  What Year? 0 points  What month? 0 points  What time? 0 points  Count back from 20 0 points  Months in reverse 0 points  Repeat phrase 0 points  Total Score 0 points    Immunizations Immunization History  Administered Date(s) Administered   Dtap, Unspecified 01/19/1973, 03/07/1974, 07/02/1974, 10/03/1975   Measles 01/19/1973   Mumps 07/02/1974   Polio, Unspecified 01/19/1973, 03/07/1974, 07/02/1974, 10/03/1975   Rubella 01/19/1973   Tdap 10/09/2011, 03/06/2020    Screening Tests Health Maintenance  Topic Date Due   HIV Screening  Never done   Hepatitis C Screening  Never done   Pneumococcal Vaccine: 50+ Years (1 of 2 - PCV) 01/11/2025 (Originally 02/08/1990)   INFLUENZA VACCINE  01/11/2025 (Originally 12/22/2023)   Hepatitis B Vaccines 19-59 Average Risk (1 of 3 - 19+ 3-dose series) 01/11/2025 (Originally 02/08/1990)   Zoster Vaccines- Shingrix (1 of 2) 01/11/2025 (Originally 02/08/1990)   Medicare Annual Wellness (AWV)  01/11/2025   Colonoscopy  01/27/2025   DTaP/Tdap/Td (7 - Td or Tdap) 03/06/2030   HPV VACCINES  Aged Out   Meningococcal B Vaccine  Aged Out   COVID-19 Vaccine  Discontinued    Health Maintenance  Health Maintenance Due  Topic Date Due   HIV Screening  Never done   Hepatitis C Screening  Never done   Health Maintenance Items Addressed: Pt declines all vaccines. Will get Hep C and HIV screen at physical.   Additional Screening:  Vision Screening: Recommended annual ophthalmology exams for early detection of glaucoma and other disorders of the eye. Would you like a referral to an eye doctor? No    Dental Screening: Recommended annual dental exams for proper oral hygiene  Community Resource Referral / Chronic Care Management: CRR  required this visit?  No   CCM required this visit?  No   Plan:    I have personally reviewed and noted the following in the patient's chart:   Medical and social history Use of alcohol, tobacco or illicit drugs  Current medications and supplements including opioid prescriptions. Patient is currently taking opioid prescriptions. Information provided to patient regarding non-opioid alternatives. Patient advised to discuss non-opioid treatment plan with their provider. Functional ability and status Nutritional status Physical activity Advanced directives List of other physicians Hospitalizations, surgeries, and ER visits in previous 12 months Vitals Screenings to include cognitive, depression, and falls Referrals and appointments  In addition, I have reviewed and discussed with patient certain preventive protocols, quality metrics, and best practice recommendations. A written personalized care plan for preventive services as well as general preventive health recommendations were provided to patient.   Lolita Libra, CMA   01/12/2024   After Visit Summary: (In Person-Printed) AVS printed and  given to the patient  Notes: see phone

## 2024-02-05 ENCOUNTER — Other Ambulatory Visit: Payer: Self-pay | Admitting: Family

## 2024-02-13 DIAGNOSIS — Z5181 Encounter for therapeutic drug level monitoring: Secondary | ICD-10-CM | POA: Diagnosis not present

## 2024-02-13 DIAGNOSIS — M961 Postlaminectomy syndrome, not elsewhere classified: Secondary | ICD-10-CM | POA: Diagnosis not present

## 2024-02-13 DIAGNOSIS — M47816 Spondylosis without myelopathy or radiculopathy, lumbar region: Secondary | ICD-10-CM | POA: Diagnosis not present

## 2024-02-13 DIAGNOSIS — Z79899 Other long term (current) drug therapy: Secondary | ICD-10-CM | POA: Diagnosis not present

## 2024-02-13 DIAGNOSIS — G894 Chronic pain syndrome: Secondary | ICD-10-CM | POA: Diagnosis not present

## 2024-03-05 ENCOUNTER — Other Ambulatory Visit: Payer: Self-pay | Admitting: *Deleted

## 2024-03-09 DIAGNOSIS — B029 Zoster without complications: Secondary | ICD-10-CM | POA: Diagnosis not present

## 2024-03-09 DIAGNOSIS — R21 Rash and other nonspecific skin eruption: Secondary | ICD-10-CM | POA: Diagnosis not present

## 2024-03-25 ENCOUNTER — Telehealth: Payer: Self-pay

## 2024-03-25 NOTE — Telephone Encounter (Signed)
 Patient is overdue for an appointment. LMOM for patient to call and schedule.

## 2024-04-01 ENCOUNTER — Other Ambulatory Visit: Payer: Self-pay | Admitting: Neurology

## 2024-04-01 MED ORDER — ATORVASTATIN CALCIUM 40 MG PO TABS: 40.0000 mg | ORAL_TABLET | Freq: Every day | ORAL | 0 refills | Status: DC

## 2024-04-01 NOTE — Telephone Encounter (Signed)
 Received paper request for medication, transfer of care appt not until Feb 2026. Last seen by Leita 12/09/2022

## 2024-04-17 ENCOUNTER — Other Ambulatory Visit: Payer: Self-pay | Admitting: Family Medicine

## 2024-04-17 MED ORDER — PANTOPRAZOLE SODIUM 40 MG PO TBEC: 40.0000 mg | DELAYED_RELEASE_TABLET | Freq: Every day | ORAL | 0 refills | Status: AC

## 2024-04-17 NOTE — Telephone Encounter (Unsigned)
 Copied from CRM #[id number]. Topic: Clinical - Medication Refill >> Apr 17, 2024 10:49 AM Thersia C wrote: Medication: pantoprazole  (PROTONIX ) 40 MG tablet - Patient has a appointment for 02//11 with NP Waddell Mon needs medication refill while the wait   Has the patient contacted their pharmacy? Yes (Agent: If no, request that the patient contact the pharmacy for the refill. If patient does not wish to contact the pharmacy document the reason why and proceed with request.) (Agent: If yes, when and what did the pharmacy advise?)  This is the patient's preferred pharmacy:  [facility] [address], [place] - [address] [facility] [place] [address] Phone: [phone] Fax: [phone]  Is this the correct pharmacy for this prescription? Yes If no, delete pharmacy and type the correct one.   Has the prescription been filled recently? No  Is the patient out of the medication? Yes  Has the patient been seen for an appointment in the last year OR does the patient have an upcoming appointment? Yes  Can we respond through MyChart? Yes  Agent: Please be advised that Rx refills may take up to 3 business days. We ask that you follow-up with your pharmacy.

## 2024-04-22 ENCOUNTER — Other Ambulatory Visit: Payer: Self-pay

## 2024-04-22 ENCOUNTER — Telehealth: Payer: Self-pay | Admitting: Family Medicine

## 2024-04-22 MED ORDER — DILTIAZEM HCL ER BEADS 180 MG PO CP24: 180.0000 mg | ORAL_CAPSULE | Freq: Every day | ORAL | 0 refills | Status: AC

## 2024-04-22 NOTE — Telephone Encounter (Unsigned)
 Copied from CRM #[id number]. Topic: Clinical - Medication Refill >> Apr 22, 2024  1:20 PM Brittany M wrote: Medication: diltiazem  (TIAZAC ) 180 MG 24 hr capsule  Has the patient contacted their pharmacy? Yes (Agent: If no, request that the patient contact the pharmacy for the refill. If patient does not wish to contact the pharmacy document the reason why and proceed with request.) (Agent: If yes, when and what did the pharmacy advise?)  This is the patient's preferred pharmacy:  [facility] [address] [place] - [address] [facility] [place] [address] Phone: [phone] Fax: [phone]  Is this the correct pharmacy for this prescription? Yes If no, delete pharmacy and type the correct one.   Has the prescription been filled recently? Yes  Is the patient out of the medication? Yes  Has the patient been seen for an appointment in the last year OR does the patient have an upcoming appointment? Yes  Can we respond through MyChart? Yes  Agent: Please be advised that Rx refills may take up to 3 business days. We ask that you follow-up with your pharmacy.

## 2024-04-28 ENCOUNTER — Other Ambulatory Visit: Payer: Self-pay | Admitting: Family

## 2024-05-21 ENCOUNTER — Telehealth: Payer: Self-pay | Admitting: Family

## 2024-05-21 NOTE — Telephone Encounter (Signed)
 Copied from CRM #[id number]. Topic: Medicare AWV >> May 21, 2024  1:56 PM Nathanel DEL wrote: Called LVM 05/21/2024 to sched AWVI. Please schedule AWVI in office.   Nathanel Paschal; Care Guide Ambulatory Clinical Support [place] [facility] Direct Dial: [phone]

## 2024-05-26 ENCOUNTER — Other Ambulatory Visit: Payer: Self-pay | Admitting: Family

## 2024-06-05 ENCOUNTER — Encounter: Payer: Self-pay | Admitting: Family

## 2024-06-07 ENCOUNTER — Telehealth: Payer: Self-pay | Admitting: Cardiology

## 2024-06-07 NOTE — Telephone Encounter (Signed)
 Pt is requesting a provider switch from Dr. Kate to Dr. Bernie due to him wanting to be seen in [place]. Please confirm.

## 2024-06-07 NOTE — Telephone Encounter (Signed)
 Okay with me

## 2024-06-19 ENCOUNTER — Emergency Department (HOSPITAL_BASED_OUTPATIENT_CLINIC_OR_DEPARTMENT_OTHER)

## 2024-06-19 ENCOUNTER — Emergency Department (HOSPITAL_BASED_OUTPATIENT_CLINIC_OR_DEPARTMENT_OTHER)
Admission: EM | Admit: 2024-06-19 | Discharge: 2024-06-19 | Disposition: A | Discharge status: Home / Self Care | Attending: Emergency Medicine | Admitting: Emergency Medicine

## 2024-06-19 ENCOUNTER — Other Ambulatory Visit: Payer: Self-pay

## 2024-06-19 ENCOUNTER — Encounter (HOSPITAL_BASED_OUTPATIENT_CLINIC_OR_DEPARTMENT_OTHER): Payer: Self-pay | Admitting: Emergency Medicine

## 2024-06-19 DIAGNOSIS — S3992XA Unspecified injury of lower back, initial encounter: Secondary | ICD-10-CM | POA: Insufficient documentation

## 2024-06-19 DIAGNOSIS — G8929 Other chronic pain: Secondary | ICD-10-CM | POA: Insufficient documentation

## 2024-06-19 DIAGNOSIS — S0990XA Unspecified injury of head, initial encounter: Secondary | ICD-10-CM | POA: Insufficient documentation

## 2024-06-19 DIAGNOSIS — W000XXA Fall on same level due to ice and snow, initial encounter: Secondary | ICD-10-CM | POA: Insufficient documentation

## 2024-06-19 DIAGNOSIS — M545 Low back pain, unspecified: Secondary | ICD-10-CM | POA: Insufficient documentation

## 2024-06-19 DIAGNOSIS — M542 Cervicalgia: Secondary | ICD-10-CM | POA: Insufficient documentation

## 2024-06-19 DIAGNOSIS — W19XXXA Unspecified fall, initial encounter: Secondary | ICD-10-CM

## 2024-06-19 LAB — URINALYSIS, ROUTINE W REFLEX MICROSCOPIC
Glucose, UA: NEGATIVE mg/dL
Hgb urine dipstick: NEGATIVE
Ketones, ur: NEGATIVE mg/dL
Leukocytes,Ua: NEGATIVE
Nitrite: NEGATIVE
Protein, ur: 30 mg/dL — AB
Specific Gravity, Urine: >=1.03 (ref 1.005–1.030)
pH: 5.5 (ref 5.0–8.0)

## 2024-06-19 LAB — URINALYSIS, MICROSCOPIC (REFLEX): WBC, UA: NONE SEEN WBC/hpf (ref 0–5)

## 2024-06-19 MED ORDER — KETOROLAC TROMETHAMINE 60 MG/2ML IM SOLN
60.0000 mg | Freq: Once | INTRAMUSCULAR | Status: AC
  Administered 2024-06-19: 60 mg via INTRAMUSCULAR
  Filled 2024-06-19: qty 2

## 2024-06-19 MED ORDER — CELECOXIB 200 MG PO CAPS: 200.0000 mg | ORAL_CAPSULE | Freq: Two times a day (BID) | ORAL | 0 refills | Status: AC

## 2024-06-19 MED ORDER — CYCLOBENZAPRINE HCL 10 MG PO TABS: 5.0000 mg | ORAL_TABLET | Freq: Two times a day (BID) | ORAL | 0 refills | Status: DC | PRN

## 2024-06-19 MED ORDER — CYCLOBENZAPRINE HCL 10 MG PO TABS: 5.0000 mg | ORAL_TABLET | Freq: Two times a day (BID) | ORAL | 0 refills | Status: AC | PRN

## 2024-06-19 MED ORDER — DIAZEPAM 2 MG PO TABS
2.0000 mg | ORAL_TABLET | Freq: Once | ORAL | Status: AC
  Administered 2024-06-19: 2 mg via ORAL
  Filled 2024-06-19: qty 1

## 2024-06-19 MED ORDER — OXYCODONE-ACETAMINOPHEN 5-325 MG PO TABS
2.0000 | ORAL_TABLET | Freq: Once | ORAL | Status: AC
  Administered 2024-06-19: 2 via ORAL
  Filled 2024-06-19: qty 2

## 2024-06-19 NOTE — Discharge Instructions (Signed)
 ### Back and Neck Injury: Recovery Instructions After Your Fall     **What Happened to You?**      You slipped on ice and injured your back and neck. The good news is that your CT scan of your head, neck X-rays, and back X-rays did not show any broken bones or serious injuries. You have what we call an acute musculoskeletal injury - this means your muscles, ligaments, and soft tissues were strained or bruised from the fall.      **What to Expect**      Most people with back and neck pain from falls improve over time, even without treatment. You should expect significant improvement within the first month. The first few days may be the most uncomfortable, but your pain should gradually decrease as your body heals.      **What You Should Do at Home**      **Stay Active:** It's important to remain as active as you can tolerate. Bed rest is not recommended and may actually slow your recovery. Continue your normal daily activities as much as possible, but listen to your body and don't push through severe pain.      **Apply Heat:** Use a heating pad or warm compress on the painful areas for 20 minutes several times a day. This can help reduce pain and improve your range of motion.      **Your Medications**      You have been prescribed two new medications to help with your pain and muscle spasms:      **Celecoxib  (Celebrex ):** This is an anti-inflammatory medication that helps reduce pain and swelling. Take it exactly as prescribed. Because you are already on chronic pain medication, this will provide additional pain relief without increasing your opioid dose. Common side effects include stomach upset. Take it with food if this occurs. Contact your doctor if you develop severe stomach pain, black stools, or vomiting.      **Cyclobenzaprine  (Flexeril ):** This is a muscle relaxant that helps relieve muscle spasms and tightness. It works best in the first few days after your injury. This medication can  cause drowsiness, so do not drive or operate machinery until you know how it affects you. It should only be used for 2-3 weeks. Common side effects include drowsiness and dry mouth.      **Important Note About Your Current Pain Management:** Continue taking your regular pain medications and using your spinal stimulator as prescribed. Do not stop or change these without talking to your pain management doctor. The new medications are meant to work alongside your current treatment plan.      **Warning Signs - When to Seek Immediate Medical Attention**      Call 911 or go to the emergency department if you experience:      - Sudden weakness or numbness in your arms or legs      - Loss of bowel or bladder control      - Severe headache that gets worse      - Difficulty walking or loss of balance      - Fever with neck stiffness      **Call Your Doctor If You Experience:**      - Pain that gets significantly worse instead of better      - New numbness or tingling in your arms or legs      - Pain that travels down your arms or legs      - No improvement after 2-3 weeks      -  Severe stomach pain or black stools while taking celecoxib       **Follow-Up Care**      Schedule a follow-up appointment with your doctor in 1-2 weeks if your symptoms are not improving. If you have a pain management specialist for your chronic pain condition, let them know about this new injury at your next appointment.      **Recovery Timeline**      Most people see significant improvement within the first few weeks. The muscle relaxant (cyclobenzaprine ) is typically only needed for 2-3 weeks. Your doctor will reassess your need for the anti-inflammatory medication (celecoxib ) at your follow-up visit.

## 2024-06-19 NOTE — ED Triage Notes (Signed)
 Pt fell on ice tonight; c/o pain to LT side back and shoulder, neck pain ; hx of back surgeries and stimulator (spinal)

## 2024-06-24 ENCOUNTER — Other Ambulatory Visit: Payer: Self-pay | Admitting: Family

## 2024-07-03 ENCOUNTER — Encounter: Admitting: Family Medicine

## 2024-08-20 ENCOUNTER — Ambulatory Visit: Admitting: Cardiology
